# Patient Record
Sex: Female | Born: 1985 | Race: White | Hispanic: No | Marital: Single | Smoking: Never smoker
Health system: Southern US, Community
[De-identification: ages and names within clinical notes are randomized; demographics above are authoritative.]

## PROBLEM LIST (undated history)

## (undated) DIAGNOSIS — F419 Anxiety disorder, unspecified: Secondary | ICD-10-CM

## (undated) DIAGNOSIS — I10 Essential (primary) hypertension: Secondary | ICD-10-CM

## (undated) DIAGNOSIS — D649 Anemia, unspecified: Secondary | ICD-10-CM

## (undated) DIAGNOSIS — F909 Attention-deficit hyperactivity disorder, unspecified type: Secondary | ICD-10-CM

## (undated) DIAGNOSIS — F32A Depression, unspecified: Secondary | ICD-10-CM

## (undated) DIAGNOSIS — Z86018 Personal history of other benign neoplasm: Secondary | ICD-10-CM

## (undated) HISTORY — DX: Anemia, unspecified: D64.9

## (undated) HISTORY — DX: Essential (primary) hypertension: I10

## (undated) HISTORY — DX: Depression, unspecified: F32.A

---
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
## 2009-07-15 ENCOUNTER — Ambulatory Visit: Payer: Self-pay | Admitting: Family

## 2010-07-26 ENCOUNTER — Ambulatory Visit: Payer: Self-pay | Admitting: Internal Medicine

## 2010-08-24 DIAGNOSIS — Z86018 Personal history of other benign neoplasm: Secondary | ICD-10-CM

## 2010-08-24 HISTORY — DX: Personal history of other benign neoplasm: Z86.018

## 2011-05-22 ENCOUNTER — Ambulatory Visit: Payer: Self-pay | Admitting: Internal Medicine

## 2011-07-02 ENCOUNTER — Ambulatory Visit: Payer: Self-pay | Admitting: Internal Medicine

## 2011-08-05 ENCOUNTER — Encounter: Payer: Self-pay | Admitting: Internal Medicine

## 2011-08-05 ENCOUNTER — Ambulatory Visit (INDEPENDENT_AMBULATORY_CARE_PROVIDER_SITE_OTHER): Payer: Self-pay | Admitting: Internal Medicine

## 2011-08-05 VITALS — BP 120/94 | HR 95 | Temp 98.5°F | Resp 16 | Ht 65.0 in | Wt 204.5 lb

## 2011-08-05 DIAGNOSIS — Z124 Encounter for screening for malignant neoplasm of cervix: Secondary | ICD-10-CM | POA: Insufficient documentation

## 2011-08-05 DIAGNOSIS — I10 Essential (primary) hypertension: Secondary | ICD-10-CM

## 2011-08-05 DIAGNOSIS — E669 Obesity, unspecified: Secondary | ICD-10-CM

## 2011-08-05 MED ORDER — HYDROCHLOROTHIAZIDE 25 MG PO TABS: 25.0000 mg | ORAL_TABLET | Freq: Every day | ORAL | Status: DC

## 2011-08-05 NOTE — Progress Notes (Signed)
  Subjective:    Patient ID: Cynthia Lewis, female    DOB: [birth year], 25 y.o.   MRN: [id number]  HPI    Review of Systems     Objective:   Physical Exam        Assessment & Plan:  BP 120/94  Pulse 95  Temp(Src) 98.5 F (36.9 C) (Oral)  Resp 16  Ht 5\' 5"  (1.651 m)  Wt 204 lb 8 oz (92.761 kg)  BMI 34.03 kg/m2  SpO2 99%  LMP 07/06/2011  Subjective:     Cynthia Lewis is a 25 y.o. female here for a routine exam.  Current complaints: hypertension.  Personal health questionnaire reviewed: no.   Gynecologic History Patient's last menstrual period was 07/06/2011. Contraception: oral progesterone-only contraceptive Last Pap: 2011. Results were: normal Last mammogram: never done.   Obstetric History OB History    Grav Para Term Preterm Abortions TAB SAB Ect Mult Living                   The following portions of the patient's history were reviewed and updated as appropriate: allergies, current medications, past family history, past medical history, past social history, past surgical history and problem list.  Review of Systems A comprehensive review of systems was negative.    Objective:    BP 120/94  Pulse 95  Temp(Src) 98.5 F (36.9 C) (Oral)  Resp 16  Ht 5\' 5"  (1.651 m)  Wt 204 lb 8 oz (92.761 kg)  BMI 34.03 kg/m2  SpO2 99%  LMP 07/06/2011 General appearance: alert, cooperative, appears stated age and mildly obese Eyes: conjunctivae/corneas clear. PERRL, EOM's intact. Fundi benign. Ears: normal TM's and external ear canals both ears Nose: Nares normal. Septum midline. Mucosa normal. No drainage or sinus tenderness. Neck: no adenopathy, no carotid bruit, no JVD, supple, symmetrical, trachea midline and thyroid not enlarged, symmetric, no tenderness/mass/nodules Lungs: clear to auscultation bilaterally Heart: regular rate and rhythm, S1, S2 normal, no murmur, click, rub or gallop Abdomen: soft, non-tender; bowel sounds normal; no masses,  no  organomegaly Extremities: extremities normal, atraumatic, no cyanosis or edema Skin: Skin color, texture, turgor normal. No rashes or lesions    Assessment:  Obesity. Hypertension    Plan:    Education reviewed: weight bearing exercise and low carbohydrate diet.   HCTZ 25 mg daily

## 2011-08-05 NOTE — Assessment & Plan Note (Signed)
With persistent diastolic elevation, confirmed by previous readings.  Will stat hctz.  She is interested in knowing if she can eventually come off of medications,which I said may be possible with loss of 105 of her body weight.

## 2011-08-05 NOTE — Assessment & Plan Note (Signed)
Discussed need to lose 10% of body weight using diet and exercise.  Spent 15 minutes  Discussing different diets and fundamental concepts along with need for exercise. .  She is motivated due to new diagnosis of hypertension.

## 2011-08-07 LAB — HEPATIC FUNCTION PANEL
Alkaline Phosphatase: 62 IU/L (ref 25–150)
Bilirubin, Direct: 0.08 mg/dL (ref 0.00–0.40)
Total Bilirubin: 0.2 mg/dL (ref 0.0–1.2)
Total Protein: 6.7 g/dL (ref 6.0–8.5)

## 2011-08-07 LAB — BASIC METABOLIC PANEL
Calcium: 9.1 mg/dL (ref 8.7–10.2)
Creatinine, Ser: 0.94 mg/dL (ref 0.57–1.00)
GFR calc Af Amer: 98 mL/min/{1.73_m2} (ref >59)
GFR calc non Af Amer: 85 mL/min/{1.73_m2} (ref >59)
Glucose: 106 mg/dL — ABNORMAL HIGH (ref 65–99)
Sodium: 139 mmol/L (ref 134–144)

## 2011-10-07 ENCOUNTER — Telehealth: Payer: Self-pay | Admitting: Internal Medicine

## 2011-10-07 NOTE — Telephone Encounter (Signed)
Spoke with patient.  That BP reading was from December, before she started on blood pressure medicine.  She says she is not concerned about that now, but would like to talk to Dr. Darrick Huntsman about ways to control her BP so that she can come off the medicine.

## 2011-10-07 NOTE — Telephone Encounter (Signed)
[phone] Pt called to get appointment to check pt wanted after 4  Made appointment for 2/25 @ 4 pt mention her bp was running 124/90

## 2011-10-19 ENCOUNTER — Encounter: Payer: Self-pay | Admitting: Internal Medicine

## 2011-10-19 ENCOUNTER — Ambulatory Visit (INDEPENDENT_AMBULATORY_CARE_PROVIDER_SITE_OTHER): Payer: 59 | Admitting: Internal Medicine

## 2011-10-19 DIAGNOSIS — I10 Essential (primary) hypertension: Secondary | ICD-10-CM

## 2011-10-19 DIAGNOSIS — E669 Obesity, unspecified: Secondary | ICD-10-CM

## 2011-10-19 NOTE — Patient Instructions (Addendum)
B[employer]'s Carb Smart Fudgsicle;    100 cal   3 net carbs   EAS Carb Control shakes:  100 cal 2.5 carbs,  17 g protein    [employer] makes low carb pita bread and flatbread available at [facility] and [employer]               Toufayan low carb tortillas at [employer] makes a low carb tortilla too

## 2011-10-19 NOTE — Assessment & Plan Note (Signed)
Improved with weight loss and exercise. Discussed low carbohydrate diet alternatives to help dietary restrictions.

## 2011-10-19 NOTE — Assessment & Plan Note (Signed)
Well controlled on current medications.  No changes today. 

## 2011-10-19 NOTE — Progress Notes (Signed)
  Subjective:    Patient ID: Cynthia Lewis, female    DOB: 03-Jan-1986, 26 y.o.   MRN: [id number]  HPI  26 yr old white female with history of obesity and hypertension returns for followup.Marland Kitchen  Has been working with a Systems analyst for the past month and has achieve wt loss of over 6 lbs. Blood pressure has improved on HCTZ.  Past Medical History  Diagnosis Date  . Hypertension    Current Outpatient Prescriptions on File Prior to Visit  Medication Sig Dispense Refill  . etonogestrel-ethinyl estradiol (NUVARING) 0.12-0.015 MG/24HR vaginal ring Place 1 each vaginally every 28 (twenty-eight) days. Insert vaginally and leave in place for 3 consecutive weeks, then remove for 1 week.       . hydrochlorothiazide (HYDRODIURIL) 25 MG tablet Take 1 tablet (25 mg total) by mouth daily.  90 tablet  3    Review of Systems  Constitutional: Negative for fever, chills and unexpected weight change.  HENT: Negative for hearing loss, ear pain, nosebleeds, congestion, sore throat, facial swelling, rhinorrhea, sneezing, mouth sores, trouble swallowing, neck pain, neck stiffness, voice change, postnasal drip, sinus pressure, tinnitus and ear discharge.   Eyes: Negative for pain, discharge, redness and visual disturbance.  Respiratory: Negative for cough, chest tightness, shortness of breath, wheezing and stridor.   Cardiovascular: Negative for chest pain, palpitations and leg swelling.  Musculoskeletal: Negative for myalgias and arthralgias.  Skin: Negative for color change and rash.  Neurological: Negative for dizziness, weakness, light-headedness and headaches.  Hematological: Negative for adenopathy.       Objective:   Physical Exam  Constitutional: She is oriented to person, place, and time. She appears well-developed and well-nourished.  HENT:  Mouth/Throat: Oropharynx is clear and moist.  Eyes: EOM are normal. Pupils are equal, round, and reactive to light. No scleral icterus.  Neck: Normal range  of motion. Neck supple. No JVD present. No thyromegaly present.  Cardiovascular: Normal rate, regular rhythm, normal heart sounds and intact distal pulses.   Pulmonary/Chest: Effort normal and breath sounds normal.  Abdominal: Soft. Bowel sounds are normal. She exhibits no mass. There is no tenderness.  Musculoskeletal: Normal range of motion. She exhibits no edema.  Lymphadenopathy:    She has no cervical adenopathy.  Neurological: She is alert and oriented to person, place, and time.  Skin: Skin is warm and dry.  Psychiatric: She has a normal mood and affect.       Assessment & Plan:.ase   Hypertension Well controlled on current medications.  No changes today.  Obesity (BMI 30-39.9) Improved with weight loss and exercise. Discussed low carbohydrate diet alternatives to help dietary restrictions.   40 minutes spent discussing and counselling on diet  And exercise.   Updated Medication List Outpatient Encounter Prescriptions as of 10/19/2011  Medication Sig Dispense Refill  . etonogestrel-ethinyl estradiol (NUVARING) 0.12-0.015 MG/24HR vaginal ring Place 1 each vaginally every 28 (twenty-eight) days. Insert vaginally and leave in place for 3 consecutive weeks, then remove for 1 week.       . hydrochlorothiazide (HYDRODIURIL) 25 MG tablet Take 1 tablet (25 mg total) by mouth daily.  90 tablet  3

## 2011-10-27 ENCOUNTER — Encounter: Payer: Self-pay | Admitting: Internal Medicine

## 2011-12-01 ENCOUNTER — Encounter: Payer: Self-pay | Admitting: Internal Medicine

## 2011-12-01 ENCOUNTER — Ambulatory Visit (INDEPENDENT_AMBULATORY_CARE_PROVIDER_SITE_OTHER): Payer: 59 | Admitting: Internal Medicine

## 2011-12-01 VITALS — BP 110/72 | HR 66 | Temp 98.4°F | Resp 14 | Wt 183.2 lb

## 2011-12-01 DIAGNOSIS — E669 Obesity, unspecified: Secondary | ICD-10-CM

## 2011-12-01 DIAGNOSIS — I10 Essential (primary) hypertension: Secondary | ICD-10-CM

## 2011-12-01 NOTE — Progress Notes (Signed)
Patient ID: VALENA IVANOV, female   DOB: Jun 24, 1986, 26 y.o.   MRN: [id number] Patient Active Problem List  Diagnoses  . Hypertension  . Obesity (BMI 30-39.9)  . Screening for cervical cancer    Subjective:  CC:   Chief Complaint  Patient presents with  . Follow-up    HPI:   Jeily Guthridge Johnsonis a 26 y.o. female who presents Follow up on hypertension and obesity .  She has lost 16 more lbs in  Two months.  She is exercising 7 days per week doing a combination of aero and a  And following a carbohydrate restricted diet.  She feels great,  Has no new issues and has not taken her blood pressure medications in one week in order to assess need for continued therapy today    Past Medical History  Diagnosis Date  . Hypertension     No past surgical history on file.       The following portions of the patient's history were reviewed and updated as appropriate: Allergies, current medications, and problem list.    Review of Systems:   12 Pt  review of systems was negative except those addressed in the HPI,     History   Social History  . Marital Status: Single    Spouse Name: N/A    Number of Children: N/A  . Years of Education: N/A   Occupational History  . Not on file.   Social History Main Topics  . Smoking status: Never Smoker   . Smokeless tobacco: Never Used  . Alcohol Use: Yes  . Drug Use: No  . Sexually Active: Not on file   Other Topics Concern  . Not on file   Social History Narrative  . No narrative on file    Objective:  BP 110/72  Pulse 66  Temp(Src) 98.4 F (36.9 C) (Oral)  Resp 14  Wt 183 lb 4 oz (83.122 kg)  SpO2 100%  LMP 10/31/2011  General appearance: alert, cooperative and appears stated age Ears: normal TM's and external ear canals both ears Throat: lips, mucosa, and tongue normal; teeth and gums normal Neck: no adenopathy, no carotid bruit, supple, symmetrical, trachea midline and thyroid not enlarged, symmetric, no  tenderness/mass/nodules Back: symmetric, no curvature. ROM normal. No CVA tenderness. Lungs: clear to auscultation bilaterally Heart: regular rate and rhythm, S1, S2 normal, no murmur, click, rub or gallop Abdomen: soft, non-tender; bowel sounds normal; no masses,  no organomegaly Pulses: 2+ and symmetric Skin: Skin color, texture, turgor normal. No rashes or lesions Lymph nodes: Cervical, supraclavicular, and axillary nodes normal.  Assessment and Plan:  Hypertension Now resolved with loss of 10% of body weight.  We are discontinuing her medications.   Obesity (BMI 30-39.9) She has lost 10% of her body weight and has reduced her BMI significantly. She is no longer hypertensive and we are stopping her blood pressure medications her Goal is a wright of 148 pounds she plans to be there by the end of the year.    Updated Medication List Outpatient Encounter Prescriptions as of 12/01/2011  Medication Sig Dispense Refill  . Cholecalciferol (VITAMIN D3) 1000 UNITS CAPS Take by mouth.      . etonogestrel-ethinyl estradiol (NUVARING) 0.12-0.015 MG/24HR vaginal ring Place 1 each vaginally every 28 (twenty-eight) days. Insert vaginally and leave in place for 3 consecutive weeks, then remove for 1 week.       . fish oil-omega-3 fatty acids 1000 MG capsule Take 2 g by  mouth daily.      Marland Kitchen DISCONTD: hydrochlorothiazide (HYDRODIURIL) 25 MG tablet Take 1 tablet (25 mg total) by mouth daily.  90 tablet  3     No orders of the defined types were placed in this encounter.    No Follow-up on file.

## 2011-12-01 NOTE — Assessment & Plan Note (Addendum)
Now resolved with loss of 10% of body weight.  We are discontinuing her medications.

## 2011-12-02 ENCOUNTER — Encounter: Payer: Self-pay | Admitting: Internal Medicine

## 2011-12-02 NOTE — Assessment & Plan Note (Signed)
She has lost 10% of her body weight and has reduced her BMI significantly. She is no longer hypertensive and we are stopping her blood pressure medications her Goal is a wright of 148 pounds she plans to be there by the end of the year.

## 2011-12-14 ENCOUNTER — Ambulatory Visit: Payer: 59 | Admitting: Internal Medicine

## 2012-04-27 ENCOUNTER — Encounter: Payer: 59 | Admitting: Internal Medicine

## 2012-08-08 ENCOUNTER — Encounter: Payer: 59 | Admitting: Internal Medicine

## 2012-11-01 LAB — HM PAP SMEAR

## 2012-11-03 ENCOUNTER — Encounter: Payer: Self-pay | Admitting: Internal Medicine

## 2012-11-03 ENCOUNTER — Ambulatory Visit (INDEPENDENT_AMBULATORY_CARE_PROVIDER_SITE_OTHER): Payer: 59 | Admitting: Internal Medicine

## 2012-11-03 VITALS — BP 114/80 | HR 73 | Temp 98.6°F | Resp 16 | Ht 66.25 in | Wt 158.2 lb

## 2012-11-03 DIAGNOSIS — F411 Generalized anxiety disorder: Secondary | ICD-10-CM

## 2012-11-03 DIAGNOSIS — Z124 Encounter for screening for malignant neoplasm of cervix: Secondary | ICD-10-CM

## 2012-11-03 DIAGNOSIS — Z23 Encounter for immunization: Secondary | ICD-10-CM

## 2012-11-03 DIAGNOSIS — Z0000 Encounter for general adult medical examination without abnormal findings: Secondary | ICD-10-CM

## 2012-11-03 DIAGNOSIS — E669 Obesity, unspecified: Secondary | ICD-10-CM

## 2012-11-03 NOTE — Patient Instructions (Addendum)
Let me know if you decide you need medication to handle your anxiety   I will call you once I review your labs

## 2012-11-05 DIAGNOSIS — F411 Generalized anxiety disorder: Secondary | ICD-10-CM | POA: Insufficient documentation

## 2012-11-05 DIAGNOSIS — Z0000 Encounter for general adult medical examination without abnormal findings: Secondary | ICD-10-CM | POA: Insufficient documentation

## 2012-11-05 NOTE — Assessment & Plan Note (Signed)
Annual comprehensive exam was done excluding breast, pelvic and PAP smear. All screenings have been addressed .  

## 2012-11-05 NOTE — Assessment & Plan Note (Signed)
Normal PAP March 2014.

## 2012-11-05 NOTE — Assessment & Plan Note (Signed)
Secondary to work stressors.  She does not have insomnia and does not want medication at this time.

## 2012-11-05 NOTE — Progress Notes (Signed)
Patient ID: Cynthia Lewis, female   DOB: 1986-05-29, 27 y.o.   MRN: [id number]  Subjective:     Cynthia Lewis is a 27 y.o. female and is here for a comprehensive physical exam. The patient reports anxiety.  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 She has continued to lose weigth intentionally with diet and exercise and is entering a bikini contest in August.   History   Social History  . Marital Status: Single    Spouse Name: N/A    Number of Children: N/A  . Years of Education: N/A   Occupational History  . Not on file.   Social History Main Topics  . Smoking status: Never Smoker   . Smokeless tobacco: Never Used  . Alcohol Use: Yes  . Drug Use: No  . Sexually Active: Not on file   Other Topics Concern  . Not on file   Social History Narrative  . No narrative on file   Health Maintenance  Topic Date Due  . Influenza Vaccine  04/24/2013  . Pap Smear  11/02/2015  . Tetanus/tdap  11/04/2022    The following portions of the patient's history were reviewed and updated as appropriate: allergies, current medications, past family history, past medical history, past social history, past surgical history and problem list.  Review of Systems A comprehensive review of systems was negative.   Objective:    BP 114/80  Pulse 73  Temp(Src) 98.6 F (37 C) (Oral)  Resp 16  Ht 5' 6.25" (1.683 m)  Wt 158 lb 4 oz (71.782 kg)  BMI 25.34 kg/m2  SpO2 97%  LMP 10/07/2012  General appearance: alert, cooperative and appears stated age Ears: normal TM's and external ear canals both ears Throat: lips, mucosa, and tongue normal; teeth and gums normal Neck: no adenopathy, no carotid bruit, supple, symmetrical, trachea midline and thyroid not enlarged, symmetric, no tenderness/mass/nodules Back: symmetric, no curvature. ROM normal. No CVA tenderness. Lungs: clear to auscultation bilaterally Heart: regular rate and rhythm,  S1, S2 normal, no murmur, click, rub or gallop Abdomen: soft, non-tender; bowel sounds normal; no masses,  no organomegaly Pulses: 2+ and symmetric Skin: Skin color, texture, turgor normal. No rashes or lesions Lymph nodes: Cervical, supraclavicular, and axillary nodes normal.  Assessment:    Obesity (BMI 30-39.9) She has lost 46 lbs since her  First visit through diet and exercise.    Screening for cervical cancer Normal PAP March 2014.  Anxiety state, unspecified Secondary to work stressors.  She does not have insomnia and does not want medication at this time.   Routine general medical examination at a health care facility Annual comprehensive exam was done excluding breast, pelvic and PAP smear. All screenings have been addressed .    Updated Medication List Outpatient Encounter Prescriptions as of 11/03/2012  Medication Sig Dispense Refill  . Cholecalciferol (VITAMIN D3) 1000 UNITS CAPS Take by mouth.      . etonogestrel-ethinyl estradiol (NUVARING) 0.12-0.015 MG/24HR vaginal ring Place 1 each vaginally every 28 (twenty-eight) days. Insert vaginally and leave in place for 3 consecutive weeks, then remove for 1 week.       Melene Muller ON 11/11/2012] levonorgestrel (MIRENA) 20 MCG/24HR IUD 1 each by Intrauterine route once.      . [DISCONTINUED] fish oil-omega-3 fatty acids 1000 MG capsule Take 2 g by mouth daily.       No facility-administered encounter medications on file as of 11/03/2012.

## 2012-11-05 NOTE — Assessment & Plan Note (Signed)
She has lost 46 lbs since her  First visit through diet and exercise.

## 2012-11-10 ENCOUNTER — Telehealth: Payer: Self-pay | Admitting: Internal Medicine

## 2012-11-10 DIAGNOSIS — D509 Iron deficiency anemia, unspecified: Secondary | ICD-10-CM

## 2012-12-01 ENCOUNTER — Encounter: Payer: Self-pay | Admitting: Internal Medicine

## 2013-03-13 ENCOUNTER — Ambulatory Visit (INDEPENDENT_AMBULATORY_CARE_PROVIDER_SITE_OTHER): Payer: 59 | Admitting: Internal Medicine

## 2013-03-13 ENCOUNTER — Telehealth: Payer: Self-pay | Admitting: Internal Medicine

## 2013-03-13 ENCOUNTER — Encounter: Payer: Self-pay | Admitting: Internal Medicine

## 2013-03-13 VITALS — BP 104/76 | HR 67 | Temp 98.7°F | Resp 14 | Wt 148.5 lb

## 2013-03-13 DIAGNOSIS — N6452 Nipple discharge: Secondary | ICD-10-CM

## 2013-03-13 DIAGNOSIS — N6459 Other signs and symptoms in breast: Secondary | ICD-10-CM

## 2013-03-13 LAB — POCT URINE PREGNANCY: Preg Test, Ur: NEGATIVE

## 2013-03-13 NOTE — Progress Notes (Signed)
Patient ID: Cynthia Lewis, female   DOB: [birth year], 27 y.o.   MRN: [id number]  Patient Active Problem List   Diagnosis Date Noted  . Nipple discharge in female 03/13/2013  . Anemia, iron deficiency 11/10/2012  . Anxiety state, unspecified 11/05/2012  . Routine general medical examination at a health care facility 11/05/2012  . Obesity (BMI 30-39.9) 08/05/2011  . Screening for cervical cancer 08/05/2011    Subjective:  CC:   Chief Complaint  Patient presents with  . Acute Visit    Discharge from left breast clear with a milky yellow tinge to discharge X 3 days.    HPI:   Cynthia Tuohy Johnsonis a 27 y.o. female who presents Nonbloody Breast discharge since Saturday.  She was doing a self breast exam and noticed greenish tinged fluid when she squeezed her breast.  No priro history of breast abnormalities,  But paternal GM had breast Ca.   She has been using Mirena since march for contraceptions,  And only other medication is   "a fat burner" recommended to her by her personal trainer for weight loss.    Periods have been light after the first month of the mirena,  previously on th re Nuva Ring . UPT is pending  Has lost 56 lbs intentionally since Dec 2012  Through diet and exercise.    Past Medical History  Diagnosis Date  . Hypertension     No past surgical history on file.     The following portions of the patient's history were reviewed and updated as appropriate: Allergies, current medications, and problem list.    Review of Systems:   12 Pt  review of systems was negative except those addressed in the HPI,     History   Social History  . Marital Status: Single    Spouse Name: N/A    Number of Children: N/A  . Years of Education: N/A   Occupational History  . Not on file.   Social History Main Topics  . Smoking status: Never Smoker   . Smokeless tobacco: Never Used  . Alcohol Use: Yes  . Drug Use: No  . Sexually Active: Not on file   Other Topics  Concern  . Not on file   Social History Narrative  . No narrative on file    Objective:  BP 104/76  Pulse 67  Temp(Src) 98.7 F (37.1 C) (Oral)  Resp 14  Wt 148 lb 8 oz (67.359 kg)  BMI 23.78 kg/m2  SpO2 99%  General appearance: alert, cooperative and appears stated age Ears: normal TM's and external ear canals both ears Throat: lips, mucosa, and tongue normal; teeth and gums normal Neck: no adenopathy, no carotid bruit, supple, symmetrical, trachea midline and thyroid not enlarged, symmetric, no tenderness/mass/nodules Breast: breasts nodular, no discrete masses.  Assessment and Plan:  Nipple discharge in female Left side, occurred during self breast exam .  Has bvery denese breasts .FH og breast  In paternal GM.  diagnostic mammogram ordered   Updated Medication List Outpatient Encounter Prescriptions as of 03/13/2013  Medication Sig Dispense Refill  . Cholecalciferol (VITAMIN D3) 1000 UNITS CAPS Take by mouth.      . levonorgestrel (MIRENA) 20 MCG/24HR IUD 1 each by Intrauterine route once.      . [DISCONTINUED] etonogestrel-ethinyl estradiol (NUVARING) 0.12-0.015 MG/24HR vaginal ring Place 1 each vaginally every 28 (twenty-eight) days. Insert vaginally and leave in place for 3 consecutive weeks, then remove for 1 week.  No facility-administered encounter medications on file as of 03/13/2013.     Orders Placed This Encounter  Procedures  . US Breast Left  . POCT urine pregnancy    No Follow-up on file.

## 2013-03-13 NOTE — Telephone Encounter (Signed)
Left message, advising pt to call back to schedule an office visit.

## 2013-03-13 NOTE — Assessment & Plan Note (Signed)
Left side, occurred during self breast exam .  Has bvery denese breasts .FH og breast  In paternal GM.  diagnostic mammogram ordered

## 2013-03-13 NOTE — Telephone Encounter (Signed)
Patient needs appt re breast discharge.n  Her mychart is not  working

## 2013-03-21 ENCOUNTER — Other Ambulatory Visit: Payer: Self-pay | Admitting: Interventional Cardiology

## 2013-03-21 DIAGNOSIS — N6452 Nipple discharge: Secondary | ICD-10-CM

## 2013-03-22 ENCOUNTER — Telehealth: Payer: Self-pay | Admitting: Internal Medicine

## 2013-03-22 NOTE — Telephone Encounter (Signed)
Was prescribed Azithromycin for strep throat X 3 days ago at urgent care patient still reporting fever 101, taking Ibuprofen for fever and chills patient wanting to know if she should be seen here or give the ABX more time?

## 2013-03-22 NOTE — Telephone Encounter (Signed)
Typically an antibiotic should cause fevers to subside on Day 2 or 3 of treatment.  Failure to resolve means the bacteria is resistant to the antibiotic, or the fever is coming from a virus.

## 2013-03-22 NOTE — Telephone Encounter (Signed)
Was seen at urgent care and is on day #3 of antibiotic for diagnosed strep throat.  States she is still fighting a fever and is wondering if this may be something other than strep.  Asking if she needs to be patient and wait it out or if she should come in to be seen.

## 2013-03-23 NOTE — Telephone Encounter (Signed)
Patient reports feeling better today and no fever during the night, offered patient an appointment with Orville Govern NP, patient declined. Advised patient that if fevers returned she needed to be seen to call office.

## 2013-03-24 ENCOUNTER — Encounter: Payer: Self-pay | Admitting: Internal Medicine

## 2013-03-24 ENCOUNTER — Ambulatory Visit
Admission: RE | Admit: 2013-03-24 | Discharge: 2013-03-24 | Disposition: A | Discharge status: Home / Self Care | Payer: 59 | Source: Ambulatory Visit | Attending: Internal Medicine | Admitting: Internal Medicine

## 2013-03-24 ENCOUNTER — Ambulatory Visit (INDEPENDENT_AMBULATORY_CARE_PROVIDER_SITE_OTHER): Payer: 59 | Admitting: Internal Medicine

## 2013-03-24 VITALS — BP 98/60 | HR 83 | Temp 98.6°F | Resp 14 | Wt 147.0 lb

## 2013-03-24 DIAGNOSIS — R509 Fever, unspecified: Secondary | ICD-10-CM

## 2013-03-24 DIAGNOSIS — N6452 Nipple discharge: Secondary | ICD-10-CM

## 2013-03-24 DIAGNOSIS — R599 Enlarged lymph nodes, unspecified: Secondary | ICD-10-CM

## 2013-03-24 DIAGNOSIS — N6459 Other signs and symptoms in breast: Secondary | ICD-10-CM

## 2013-03-24 DIAGNOSIS — J029 Acute pharyngitis, unspecified: Secondary | ICD-10-CM

## 2013-03-24 DIAGNOSIS — R590 Localized enlarged lymph nodes: Secondary | ICD-10-CM

## 2013-03-24 MED ORDER — AMOXICILLIN-POT CLAVULANATE 875-125 MG PO TABS: 1.0000 | ORAL_TABLET | Freq: Two times a day (BID) | ORAL | Status: DC

## 2013-03-24 MED ORDER — ONDANSETRON HCL 4 MG PO TABS: 4.0000 mg | ORAL_TABLET | Freq: Three times a day (TID) | ORAL | Status: DC | PRN

## 2013-03-24 NOTE — Progress Notes (Signed)
Patient ID: Cynthia Lewis, female   DOB: 21-Feb-1986, 27 y.o.   MRN: [id number]   Patient Active Problem List   Diagnosis Date Noted  . Acute pharyngitis 03/25/2013  . Nipple discharge in female 03/13/2013  . Anemia, iron deficiency 11/10/2012  . Anxiety state, unspecified 11/05/2012  . Routine general medical examination at a health care facility 11/05/2012  . Obesity (BMI 30-39.9) 08/05/2011  . Screening for cervical cancer 08/05/2011    Subjective:  CC:   No chief complaint on file.   HPI:   Cynthia Kham Johnsonis a 27 y.o. female who presents with persistent sore throat and fevers.  Sore throat back ache and fever started on Saturday, and when symptoms persistent she was treated at  Urgent Care on Monday with a 5 day coursr of azithromycin despite a negative rapid strep test given her exposure to boyfriend who tested positive for Strep C.  She reports fevers and back ache resolved on Wednesday but continues to have sore throat, malaise and swollen cervical lymph nodes.       Past Medical History  Diagnosis Date  . Hypertension     History reviewed. No pertinent past surgical history.     The following portions of the patient's history were reviewed and updated as appropriate: Allergies, current medications, and problem list.    Review of Systems:   12 Pt  review of systems was negative except those addressed in the HPI,     History   Social History  . Marital Status: Single    Spouse Name: N/A    Number of Children: N/A  . Years of Education: N/A   Occupational History  . Not on file.   Social History Main Topics  . Smoking status: Never Smoker   . Smokeless tobacco: Never Used  . Alcohol Use: Yes  . Drug Use: No  . Sexually Active: Not on file   Other Topics Concern  . Not on file   Social History Narrative  . No narrative on file    Objective:  BP 98/60  Pulse 83  Temp(Src) 98.6 F (37 C) (Oral)  Resp 14  Wt 147 lb (66.679 kg)  BMI  23.54 kg/m2  SpO2 99%  General appearance: alert, cooperative and appears stated age Ears: normal TM's and external ear canals both ears Throat: lips, mucosa, and tongue normal; teeth and gums normal Neck: no adenopathy, no carotid bruit, supple, symmetrical, trachea midline and thyroid not enlarged, symmetric, no tenderness/mass/nodules Back: symmetric, no curvature. ROM normal. No CVA tenderness. Lungs: clear to auscultation bilaterally Heart: regular rate and rhythm, S1, S2 normal, no murmur, click, rub or gallop Abdomen: soft, non-tender; bowel sounds normal; no masses,  no organomegaly Pulses: 2+ and symmetric Skin: Skin color, texture, turgor normal. No rashes or lesions Lymph nodes: Cervical, supraclavicular, and axillary nodes normal.  Assessment and Plan:  Acute pharyngitis Her fevers abated 48 hours after starting antibiotics but she is still symptomatic.  Azithromycin was chosen given exposure to Strep C bc of prior intolerance, not allergy to PCN.  ThroatStrep culture and monospot sent ,  augmentin and zofran sent to pharmacy  Nipple discharge in female Diagnostic mammogram and ultrasound was done at [facility] and no evidence of mass or cyst was found.    Updated Medication List Outpatient Encounter Prescriptions as of 03/24/2013  Medication Sig Dispense Refill  . azithromycin (ZITHROMAX Z-PAK) 250 MG tablet Take 250 mg by mouth daily.      Marland Kitchen amoxicillin-clavulanate (AUGMENTIN)  875-125 MG per tablet Take 1 tablet by mouth 2 (two) times daily.  20 tablet  0  . Cholecalciferol (VITAMIN D3) 1000 UNITS CAPS Take by mouth.      . levonorgestrel (MIRENA) 20 MCG/24HR IUD 1 each by Intrauterine route once.      . ondansetron (ZOFRAN) 4 MG tablet Take 1 tablet (4 mg total) by mouth every 8 (eight) hours as needed for nausea.  20 tablet  0   No facility-administered encounter medications on file as of 03/24/2013.     Orders Placed This Encounter  Procedures  . Throat culture [facility])   . Monospot  . Epstein-Barr virus VCA antibody panel  . CBC w/Diff    No Follow-up on file.

## 2013-03-24 NOTE — Patient Instructions (Addendum)
I am treating you empirically for Strep C with augmentin, which is a penicillin, while your throat culture takes 48 hours to finalize  I am also checking you for mononucleosis   i sent an rx for zofran,  For nausea,  Take it 30 minutes prior to you augmentin doses  And take your augmenin after a meal

## 2013-03-25 ENCOUNTER — Encounter: Payer: Self-pay | Admitting: Internal Medicine

## 2013-03-25 DIAGNOSIS — J029 Acute pharyngitis, unspecified: Secondary | ICD-10-CM | POA: Insufficient documentation

## 2013-03-25 NOTE — Assessment & Plan Note (Signed)
Diagnostic mammogram and ultrasound was done at [facility] and no evidence of mass or cyst was found.

## 2013-03-25 NOTE — Assessment & Plan Note (Signed)
Her fevers abated 48 hours after starting antibiotics but she is still symptomatic.  Azithromycin was chosen given exposure to Strep C bc of prior intolerance, not allergy to PCN.  ThroatStrep culture and monospot sent ,  augmentin and zofran sent to pharmacy

## 2013-03-26 LAB — CBC WITH DIFFERENTIAL/PLATELET
Basos: 1 % (ref 0–3)
Eos: 2 % (ref 0–5)
Hemoglobin: 10.7 g/dL — ABNORMAL LOW (ref 11.1–15.9)
Immature Grans (Abs): 0 10*3/uL (ref 0.0–0.1)
MCV: 71 fL — ABNORMAL LOW (ref 79–97)
Monocytes Absolute: 0.6 10*3/uL (ref 0.1–0.9)
Neutrophils Absolute: 3.3 10*3/uL (ref 1.4–7.0)
Neutrophils Relative %: 45 % (ref 40–74)
RBC: 4.97 x10E6/uL (ref 3.77–5.28)
WBC: 7.4 10*3/uL (ref 3.4–10.8)

## 2013-03-26 LAB — CULTURE, GROUP A STREP

## 2013-03-26 LAB — EPSTEIN-BARR VIRUS VCA ANTIBODY PANEL: EBV AB VCA, IGG: 30.2 U/mL — ABNORMAL HIGH (ref 0.0–17.9)

## 2013-03-27 ENCOUNTER — Encounter: Payer: Self-pay | Admitting: *Deleted

## 2013-08-10 ENCOUNTER — Ambulatory Visit (INDEPENDENT_AMBULATORY_CARE_PROVIDER_SITE_OTHER): Payer: 59 | Admitting: Internal Medicine

## 2013-08-10 VITALS — BP 126/80 | HR 75 | Temp 98.2°F | Wt 172.0 lb

## 2013-08-10 DIAGNOSIS — E663 Overweight: Secondary | ICD-10-CM

## 2013-08-10 DIAGNOSIS — Z6825 Body mass index (BMI) 25.0-25.9, adult: Secondary | ICD-10-CM

## 2013-08-10 DIAGNOSIS — N390 Urinary tract infection, site not specified: Secondary | ICD-10-CM

## 2013-08-10 LAB — POCT URINALYSIS DIPSTICK
Nitrite, UA: POSITIVE
Protein, UA: NEGATIVE
Urobilinogen, UA: 0.2
pH, UA: 6

## 2013-08-10 MED ORDER — SULFAMETHOXAZOLE-TRIMETHOPRIM 800-160 MG PO TABS: 1.0000 | ORAL_TABLET | Freq: Two times a day (BID) | ORAL | Status: DC

## 2013-08-10 NOTE — Progress Notes (Signed)
Pre visit review using our clinic review tool, if applicable. No additional management support is needed unless otherwise documented below in the visit note. 

## 2013-08-10 NOTE — Progress Notes (Signed)
Patient ID: Cynthia Lewis, female   DOB: 08-02-1986, 27 y.o.   MRN: [id number]  Patient Active Problem List   Diagnosis Date Noted  . UTI (urinary tract infection) 08/12/2013  . Acute pharyngitis 03/25/2013  . Nipple discharge in female 03/13/2013  . Anemia, iron deficiency 11/10/2012  . Anxiety state, unspecified 11/05/2012  . Routine general medical examination at a health care facility 11/05/2012  . Overweight (BMI 25.0-29.9) 08/05/2011  . Screening for cervical cancer 08/05/2011    Subjective:  CC:   Chief Complaint  Patient presents with  . Urinary Tract Infection    HPI:   Cynthia Bureau Johnsonis a 27 y.o. female who presents 2 days of urinary frequency, which she treating with drinking cranberry juice which helped , but now having end stream dysuria. Subjective fevers also noted but have resolved.  No flank pain or nausea,  subsided.   Weight gain 25 lbs.  Has stopped dieting because she got sick twice and decided to stop the intensive wt loss.  Planning on going back on diet and resuming exercise after the holidays   Past Medical History  Diagnosis Date  . Hypertension     No past surgical history on file.     The following portions of the patient's history were reviewed and updated as appropriate: Allergies, current medications, and problem list.    Review of Systems:   12 Pt  review of systems was negative except those addressed in the HPI,     History   Social History  . Marital Status: Single    Spouse Name: N/A    Number of Children: N/A  . Years of Education: N/A   Occupational History  . Not on file.   Social History Main Topics  . Smoking status: Never Smoker   . Smokeless tobacco: Never Used  . Alcohol Use: Yes  . Drug Use: No  . Sexual Activity: Not on file   Other Topics Concern  . Not on file   Social History Narrative  . No narrative on file    Objective:  Filed Vitals:   08/10/13 0839  BP: 126/80  Pulse: 75  Temp:  98.2 F (36.8 C)     General appearance: alert, cooperative and appears stated age Ears: normal TM's and external ear canals both ears Throat: lips, mucosa, and tongue normal; teeth and gums normal Neck: no adenopathy, no carotid bruit, supple, symmetrical, trachea midline and thyroid not enlarged, symmetric, no tenderness/mass/nodules Back: symmetric, no curvature. ROM normal. No CVA tenderness. Lungs: clear to auscultation bilaterally Heart: regular rate and rhythm, S1, S2 normal, no murmur, click, rub or gallop Abdomen: soft, non-tender; bowel sounds normal; no masses,  no organomegaly Pulses: 2+ and symmetric Skin: Skin color, texture, turgor normal. No rashes or lesions Lymph nodes: Cervical, supraclavicular, and axillary nodes normal.  Assessment and Plan:  UTI (urinary tract infection) Empiric treatment with Septra based on history and  UA results.   Overweight (BMI 25.0-29.9) After losing  50 lbs and dropping BMI to < 24,  She has gained 25 lbs  Due to suspension of rigorous diet and exercise program . I have addressed  BMI and recommended wt loss of 10% of body weigh over the next 6 months using a low glycemic index diet and regular exercise a minimum of 5 days per week.     Updated Medication List Outpatient Encounter Prescriptions as of 08/10/2013  Medication Sig  . Cholecalciferol (VITAMIN D3) 1000 UNITS CAPS Take by  mouth.  . levonorgestrel (MIRENA) 20 MCG/24HR IUD 1 each by Intrauterine route once.  . sulfamethoxazole-trimethoprim (SEPTRA DS) 800-160 MG per tablet Take 1 tablet by mouth 2 (two) times daily.  . [DISCONTINUED] amoxicillin-clavulanate (AUGMENTIN) 875-125 MG per tablet Take 1 tablet by mouth 2 (two) times daily.  . [DISCONTINUED] azithromycin (ZITHROMAX Z-PAK) 250 MG tablet Take 250 mg by mouth daily.  . [DISCONTINUED] ondansetron (ZOFRAN) 4 MG tablet Take 1 tablet (4 mg total) by mouth every 8 (eight) hours as needed for nausea.     Orders Placed  This Encounter  Procedures  . Urine culture  . POCT urinalysis dipstick    No Follow-up on file.

## 2013-08-10 NOTE — Patient Instructions (Signed)
Empiric treatment of UTI with Septra. Pending urine culture results.

## 2013-08-12 ENCOUNTER — Encounter: Payer: Self-pay | Admitting: Internal Medicine

## 2013-08-12 DIAGNOSIS — N390 Urinary tract infection, site not specified: Secondary | ICD-10-CM | POA: Insufficient documentation

## 2013-08-12 LAB — URINE CULTURE

## 2013-08-12 NOTE — Assessment & Plan Note (Signed)
After losing  50 lbs and dropping BMI to < 24,  She has gained 25 lbs  Due to suspension of rigorous diet and exercise program . I have addressed  BMI and recommended wt loss of 10% of body weigh over the next 6 months using a low glycemic index diet and regular exercise a minimum of 5 days per week.

## 2013-08-12 NOTE — Assessment & Plan Note (Signed)
Empiric treatment with Septra based on history and  UA results.

## 2013-08-14 ENCOUNTER — Encounter: Payer: Self-pay | Admitting: Internal Medicine

## 2013-11-07 ENCOUNTER — Encounter: Payer: 59 | Admitting: Internal Medicine

## 2013-11-08 ENCOUNTER — Encounter: Payer: 59 | Admitting: Internal Medicine

## 2013-11-13 ENCOUNTER — Ambulatory Visit (INDEPENDENT_AMBULATORY_CARE_PROVIDER_SITE_OTHER): Payer: 59 | Admitting: Internal Medicine

## 2013-11-13 ENCOUNTER — Encounter: Payer: Self-pay | Admitting: Internal Medicine

## 2013-11-13 VITALS — BP 102/74 | HR 63 | Temp 98.4°F | Resp 16 | Ht 67.25 in | Wt 164.0 lb

## 2013-11-13 DIAGNOSIS — Z124 Encounter for screening for malignant neoplasm of cervix: Secondary | ICD-10-CM

## 2013-11-13 DIAGNOSIS — E663 Overweight: Secondary | ICD-10-CM

## 2013-11-13 DIAGNOSIS — D509 Iron deficiency anemia, unspecified: Secondary | ICD-10-CM

## 2013-11-13 DIAGNOSIS — Z0000 Encounter for general adult medical examination without abnormal findings: Secondary | ICD-10-CM

## 2013-11-13 MED ORDER — PHENTERMINE HCL 37.5 MG PO TABS: 19.0000 mg | ORAL_TABLET | Freq: Every day | ORAL | Status: DC

## 2013-11-13 NOTE — Patient Instructions (Addendum)
I am prescribing  you phentermine for  a short period of time to help you get to your  goal weight   Take it twice daily  1/2 tablet each time . Maximum dose 1 tablet daily  Stop it if it makes your heart race (resting pulse > 100)  o rrasies your blood pressure ( 145/85)     Phentermine tablets or capsules What is this medicine? PHENTERMINE (FEN ter meen) decreases your appetite. It is used with a reduced calorie diet and exercise to help you lose weight. This medicine may be used for other purposes; ask your health care provider or pharmacist if you have questions. COMMON BRAND NAME(S): Adipex-P, Atti-Plex P , Atti-Plex P Spansule , Fastin, Pro-Fast, Tara-8  What should I tell my health care provider before I take this medicine? They need to know if you have any of these conditions: -agitation -glaucoma -heart disease -high blood pressure -history of substance abuse -lung disease called Primary Pulmonary Hypertension (PPH) -taken an MAOI like Carbex, Eldepryl, Marplan, Nardil, or Parnate in last 14 days -thyroid disease -an unusual or allergic reaction to phentermine, other medicines, foods, dyes, or preservatives -pregnant or trying to get pregnant -breast-feeding How should I use this medicine? Take this medicine by mouth with a glass of water. Follow the directions on the prescription label. This medicine is usually taken 30 minutes before or 1 to 2 hours after breakfast. Avoid taking this medicine in the evening. It may interfere with sleep. Take your doses at regular intervals. Do not take your medicine more often than directed. Talk to your pediatrician regarding the use of this medicine in children. Special care may be needed. Overdosage: If you think you have taken too much of this medicine contact a poison control center or emergency room at once. NOTE: This medicine is only for you. Do not share this medicine with others. What if I miss a dose? If you miss a dose, take it  as soon as you can. If it is almost time for your next dose, take only that dose. Do not take double or extra doses. What may interact with this medicine? Do not take this medicine with any of the following medications: -duloxetine -MAOIs like Carbex, Eldepryl, Marplan, Nardil, and Parnate -medicines for colds or breathing difficulties like pseudoephedrine or phenylephrine -procarbazine -sibutramine -SSRIs like citalopram, escitalopram, fluoxetine, fluvoxamine, paroxetine, and sertraline -stimulants like dexmethylphenidate, methylphenidate or modafinil -venlafaxine This medicine may also interact with the following medications: -medicines for diabetes This list may not describe all possible interactions. Give your health care provider a list of all the medicines, herbs, non-prescription drugs, or dietary supplements you use. Also tell them if you smoke, drink alcohol, or use illegal drugs. Some items may interact with your medicine. What should I watch for while using this medicine? Notify your physician immediately if you become short of breath while doing your normal activities. Do not take this medicine within 6 hours of bedtime. It can keep you from getting to sleep. Avoid drinks that contain caffeine and try to stick to a regular bedtime every night. This medicine was intended to be used in addition to a healthy diet and exercise. The best results are achieved this way. This medicine is only indicated for short-term use. Eventually your weight loss may level out. At that point, the drug will only help you maintain your new weight. Do not increase or in any way change your dose without consulting your doctor. You may get drowsy  or dizzy. Do not drive, use machinery, or do anything that needs mental alertness until you know how this medicine affects you. Do not stand or sit up quickly, especially if you are an older patient. This reduces the risk of dizzy or fainting spells. Alcohol may increase  dizziness and drowsiness. Avoid alcoholic drinks. What side effects may I notice from receiving this medicine? Side effects that you should report to your doctor or health care professional as soon as possible: -chest pain, palpitations -depression or severe changes in mood -increased blood pressure -irritability -nervousness or restlessness -severe dizziness -shortness of breath -problems urinating -unusual swelling of the legs -vomiting Side effects that usually do not require medical attention (report to your doctor or health care professional if they continue or are bothersome): -blurred vision or other eye problems -changes in sexual ability or desire -constipation or diarrhea -difficulty sleeping -dry mouth or unpleasant taste -headache -nausea This list may not describe all possible side effects. Call your doctor for medical advice about side effects. You may report side effects to FDA at 1-800-FDA-1088. Where should I keep my medicine? Keep out of the reach of children. This medicine can be abused. Keep your medicine in a safe place to protect it from theft. Do not share this medicine with anyone. Selling or giving away this medicine is dangerous and against the law. Store at room temperature between 20 and 25 degrees C (68 and 77 degrees F). Keep container tightly closed. Throw away any unused medicine after the expiration date. NOTE: This sheet is a summary. It may not cover all possible information. If you have questions about this medicine, talk to your doctor, pharmacist, or health care provider.  2014, Elsevier/Gold Standard. (2010-09-24 11:02:44)

## 2013-11-13 NOTE — Progress Notes (Signed)
Patient ID: Cynthia Lewis, female   DOB: [birth year], 28 y.o.   MRN: [id number]  Subjective:     Cynthia Lewis is a 28 y.o. female and is here for a comprehensive non gyn physical exam. The patient reports as follows:.  Has been having intermittent Right hip pain and popping when she walks which becomes pai ful after running for  about 20 minutes  At a sprint .  Present for about 3 months   Has not tried pre treating with nsaids  Still training for a /body building competition.  Had to suspend efforts last Fall due ot repeated illnesses and gained back nearly 20 lbs.  She is now lisng weight with considerable effort and  exercising vigorously but not at goal and frustrated with plateuing of weight. Has increased appetite,  Thinking of trying OTC appetite suppressants.    History   Social History  . Marital Status: Single    Spouse Name: N/A    Number of Children: N/A  . Years of Education: N/A   Occupational History  . Not on file.   Social History Main Topics  . Smoking status: Never Smoker   . Smokeless tobacco: Never Used  . Alcohol Use: Yes  . Drug Use: No  . Sexual Activity: Not on file   Other Topics Concern  . Not on file   Social History Narrative  . No narrative on file   Health Maintenance  Topic Date Due  . Influenza Vaccine  03/24/2013  . Pap Smear  11/04/2015  . Tetanus/tdap  11/04/2022    The following portions of the patient's history were reviewed and updated as appropriate: allergies, current medications, past family history, past medical history, past social history, past surgical history and problem list.  Review of Systems A comprehensive review of systems was negative.   Objective:   BP 102/74  Pulse 63  Temp(Src) 98.4 F (36.9 C) (Oral)  Resp 16  Ht 5' 7.25" (1.708 m)  Wt 164 lb (74.39 kg)  BMI 25.50 kg/m2  SpO2 98%  General appearance: alert, cooperative and appears stated age Ears: normal TM's and external ear canals both  ears Throat: lips, mucosa, and tongue normal; teeth and gums normal Neck: no adenopathy, no carotid bruit, supple, symmetrical, trachea midline and thyroid not enlarged, symmetric, no tenderness/mass/nodules Back: symmetric, no curvature. ROM normal. No CVA tenderness. Lungs: clear to auscultation bilaterally Heart: regular rate and rhythm, S1, S2 normal, no murmur, click, rub or gallop Abdomen: soft, non-tender; bowel sounds normal; no masses,  no organomegaly Pulses: 2+ and symmetric Skin: Skin color, texture, turgor normal. No rashes or lesions Lymph nodes: Cervical, supraclavicular, and axillary nodes normal.   Assessment and Plan:   Routine general medical examination at a health care facility Annual comprehensive exam was done excluding breast, pelvic and PAP smear. All screenings have been addressed .   Overweight (BMI 25.0-29.9) She has had difficulty losing weight to goal due to increased appetite and is requesting a trial of  Phentermine.  She is aware of the possible side effects and risks and understands that    The medication will be discontinued if she has not lost 5% of her body weight over the next 3 months, which , based on today's weight is 16 lbs.  Anemia, iron deficiency Repeat hgb dad dropped to 10.7 in August 2014 and iron supplements were again prescribed.  Repeat hgb is due.   Screening for cervical cancer Done by GYN  Updated Medication List Outpatient Encounter Prescriptions as of 11/13/2013  Medication Sig  . Cholecalciferol (VITAMIN D3) 1000 UNITS CAPS Take by mouth.  . levonorgestrel (MIRENA) 20 MCG/24HR IUD 1 each by Intrauterine route once.  . phentermine (ADIPEX-P) 37.5 MG tablet Take 0.5 tablets (18.75 mg total) by mouth daily before breakfast.  . sulfamethoxazole-trimethoprim (SEPTRA DS) 800-160 MG per tablet Take 1 tablet by mouth 2 (two) times daily.

## 2013-11-13 NOTE — Progress Notes (Signed)
Pre-visit discussion using our clinic review tool. No additional management support is needed unless otherwise documented below in the visit note.  

## 2013-11-14 ENCOUNTER — Other Ambulatory Visit: Payer: Self-pay | Admitting: Internal Medicine

## 2013-11-14 NOTE — Assessment & Plan Note (Signed)
Done by GYN

## 2013-11-14 NOTE — Assessment & Plan Note (Signed)
Repeat hgb dad dropped to 10.7 in August 2014 and iron supplements were again prescribed.  Repeat hgb is due.

## 2013-11-14 NOTE — Assessment & Plan Note (Signed)
Annual comprehensive exam was done excluding breast, pelvic and PAP smear. All screenings have been addressed .  

## 2013-11-14 NOTE — Assessment & Plan Note (Addendum)
She has had difficulty losing weight to goal due to increased appetite and is requesting a trial of  Phentermine.  She is aware of the possible side effects and risks and understands that    The medication will be discontinued if she has not lost 5% of her body weight over the next 3 months, which , based on today's weight is 16 lbs.

## 2013-11-15 LAB — CBC WITH DIFFERENTIAL
BASOS ABS: 0 10*3/uL (ref 0.0–0.2)
Basos: 1 %
Eos: 3 %
Eosinophils Absolute: 0.2 10*3/uL (ref 0.0–0.4)
HCT: 45.3 % (ref 34.0–46.6)
Hemoglobin: 15.1 g/dL (ref 11.1–15.9)
Immature Grans (Abs): 0 10*3/uL (ref 0.0–0.1)
Immature Granulocytes: 0 %
LYMPHS ABS: 2.9 10*3/uL (ref 0.7–3.1)
LYMPHS: 44 %
MCH: 28.5 pg (ref 26.6–33.0)
MCHC: 33.3 g/dL (ref 31.5–35.7)
MCV: 86 fL (ref 79–97)
Monocytes Absolute: 0.5 10*3/uL (ref 0.1–0.9)
Monocytes: 7 %
Neutrophils Absolute: 3.1 10*3/uL (ref 1.4–7.0)
Neutrophils Relative %: 45 %
PLATELETS: 231 10*3/uL (ref 150–379)
RBC: 5.3 x10E6/uL — ABNORMAL HIGH (ref 3.77–5.28)
RDW: 18.2 % — ABNORMAL HIGH (ref 12.3–15.4)
WBC: 6.7 10*3/uL (ref 3.4–10.8)

## 2013-11-15 LAB — COMPREHENSIVE METABOLIC PANEL
ALK PHOS: 69 IU/L (ref 39–117)
ALT: 15 IU/L (ref 0–32)
AST: 22 IU/L (ref 0–40)
Albumin/Globulin Ratio: 2.4 (ref 1.1–2.5)
Albumin: 4.5 g/dL (ref 3.5–5.5)
BILIRUBIN TOTAL: 0.7 mg/dL (ref 0.0–1.2)
BUN / CREAT RATIO: 18 (ref 8–20)
BUN: 22 mg/dL — AB (ref 6–20)
CHLORIDE: 102 mmol/L (ref 97–108)
CO2: 24 mmol/L (ref 18–29)
Calcium: 9.6 mg/dL (ref 8.7–10.2)
Creatinine, Ser: 1.19 mg/dL — ABNORMAL HIGH (ref 0.57–1.00)
GFR calc Af Amer: 73 mL/min/{1.73_m2} (ref >59)
GFR calc non Af Amer: 63 mL/min/{1.73_m2} (ref >59)
Globulin, Total: 1.9 g/dL (ref 1.5–4.5)
Glucose: 75 mg/dL (ref 65–99)
POTASSIUM: 4.9 mmol/L (ref 3.5–5.2)
Sodium: 142 mmol/L (ref 134–144)
Total Protein: 6.4 g/dL (ref 6.0–8.5)

## 2013-11-15 LAB — LIPID PANEL W/O CHOL/HDL RATIO
CHOLESTEROL TOTAL: 134 mg/dL (ref 100–199)
HDL: 45 mg/dL (ref >39)
LDL Calculated: 70 mg/dL (ref 0–99)
TRIGLYCERIDES: 97 mg/dL (ref 0–149)
VLDL Cholesterol Cal: 19 mg/dL (ref 5–40)

## 2013-11-15 LAB — TSH: TSH: 3.19 u[IU]/mL (ref 0.450–4.500)

## 2013-11-15 LAB — VITAMIN D 25 HYDROXY (VIT D DEFICIENCY, FRACTURES): Vit D, 25-Hydroxy: 45.8 ng/mL (ref 30.0–100.0)

## 2013-11-16 ENCOUNTER — Encounter: Payer: Self-pay | Admitting: Internal Medicine

## 2013-11-19 ENCOUNTER — Telehealth: Payer: Self-pay | Admitting: Internal Medicine

## 2013-11-20 ENCOUNTER — Ambulatory Visit: Payer: 59

## 2013-11-20 ENCOUNTER — Other Ambulatory Visit: Payer: Self-pay | Admitting: *Deleted

## 2013-11-21 ENCOUNTER — Other Ambulatory Visit: Payer: Self-pay | Admitting: *Deleted

## 2013-11-21 NOTE — Telephone Encounter (Signed)
Mailed unread message to pt  

## 2013-11-23 ENCOUNTER — Ambulatory Visit (INDEPENDENT_AMBULATORY_CARE_PROVIDER_SITE_OTHER): Payer: 59 | Admitting: *Deleted

## 2013-11-23 ENCOUNTER — Telehealth: Payer: Self-pay | Admitting: *Deleted

## 2013-11-23 VITALS — BP 108/72 | HR 78

## 2013-11-23 DIAGNOSIS — Z0130 Encounter for examination of blood pressure without abnormal findings: Secondary | ICD-10-CM

## 2013-11-23 DIAGNOSIS — I10 Essential (primary) hypertension: Secondary | ICD-10-CM

## 2013-11-23 NOTE — Telephone Encounter (Signed)
Pt presents for BP check. Taking Phentermine 1/2 tablet bid, tolerating without difficulty. BP in office 108/72 and HR 78.

## 2013-11-23 NOTE — Progress Notes (Signed)
Pt presents for BP check. Taking Phentermine 1/2 tablet bid, tolerating without difficulty. BP in office 108/72 and HR 78.  

## 2013-11-23 NOTE — Telephone Encounter (Signed)
Can continue phentermine

## 2013-12-11 ENCOUNTER — Ambulatory Visit: Payer: 59 | Admitting: Internal Medicine

## 2013-12-11 ENCOUNTER — Encounter: Payer: Self-pay | Admitting: Internal Medicine

## 2013-12-11 ENCOUNTER — Telehealth: Payer: Self-pay | Admitting: Internal Medicine

## 2013-12-11 DIAGNOSIS — R7989 Other specified abnormal findings of blood chemistry: Secondary | ICD-10-CM | POA: Insufficient documentation

## 2013-12-11 NOTE — Telephone Encounter (Signed)
The patient stated she was sent a my chart message by Dr. Darrick Huntsmanullo to have her labs redrawn for a creatinine level. She is wanting orders sent to [facility] [place] [facility] [place] to have those drawn.

## 2013-12-11 NOTE — Telephone Encounter (Signed)
Please advise. Placed lab order in red folder.

## 2013-12-12 NOTE — Telephone Encounter (Signed)
Patient notified lab request has been faxed to [facility] as requested by patient.

## 2013-12-18 LAB — BASIC METABOLIC PANEL
BUN: 11 mg/dL (ref 4–21)
CREATININE: 1.1 mg/dL (ref 0.5–1.1)
Glucose: 83 mg/dL
Potassium: 4.4 mmol/L (ref 3.4–5.3)
Sodium: 139 mmol/L (ref 137–147)

## 2013-12-21 ENCOUNTER — Telehealth: Payer: Self-pay | Admitting: Internal Medicine

## 2013-12-21 DIAGNOSIS — R7989 Other specified abnormal findings of blood chemistry: Secondary | ICD-10-CM

## 2013-12-21 NOTE — Assessment & Plan Note (Signed)
Repeat BUN is 11 and Cr 1.11

## 2013-12-21 NOTE — Telephone Encounter (Signed)
Left detailed message (per DPR) on cell phone.  

## 2013-12-21 NOTE — Telephone Encounter (Signed)
I do not have your previous labs for comparison bc they have not been scanned in  Since you get them done at [facility],.  but i believe your current labs show improved kidney function and improved hydration

## 2013-12-22 ENCOUNTER — Encounter: Payer: Self-pay | Admitting: Internal Medicine

## 2014-01-25 ENCOUNTER — Encounter: Payer: Self-pay | Admitting: Internal Medicine

## 2014-01-25 MED ORDER — PHENTERMINE HCL 37.5 MG PO TABS: 19.0000 mg | ORAL_TABLET | Freq: Every day | ORAL | Status: DC

## 2014-01-25 NOTE — Telephone Encounter (Signed)
Patient can only take meds for total of 3 months; has to prove wt loss before refilling.  30 days only , needs  RN appt for weigh in

## 2014-01-25 NOTE — Telephone Encounter (Signed)
Last visit 11/13/13, refill?

## 2014-01-30 ENCOUNTER — Ambulatory Visit (INDEPENDENT_AMBULATORY_CARE_PROVIDER_SITE_OTHER): Payer: 59 | Admitting: *Deleted

## 2014-01-30 VITALS — BP 118/70 | HR 66 | Temp 99.3°F | Resp 16 | Wt 148.5 lb

## 2014-01-30 DIAGNOSIS — Z79899 Other long term (current) drug therapy: Secondary | ICD-10-CM

## 2014-01-30 DIAGNOSIS — E663 Overweight: Secondary | ICD-10-CM

## 2014-01-30 LAB — HEIGHT AND WEIGHT: PATIENT WEIGHT (LBS): 148.8

## 2014-01-30 MED ORDER — PHENTERMINE HCL 37.5 MG PO TABS: 19.0000 mg | ORAL_TABLET | Freq: Every day | ORAL | Status: DC

## 2014-01-30 NOTE — Progress Notes (Signed)
Patient ID: Cynthia Lewis, female   DOB: 1985-12-05, 28 y.o.   MRN: [id number] Follow up on overweight, managed with pharmacotherapy  (phentermine) .  Patient returns for weight measurement.

## 2014-01-30 NOTE — Assessment & Plan Note (Addendum)
She has lost 16 lbs, or 10% of her body weight since march.  Will authorize its use for one more month as requested.

## 2014-01-31 ENCOUNTER — Other Ambulatory Visit: Payer: Self-pay | Admitting: Internal Medicine

## 2014-01-31 ENCOUNTER — Encounter: Payer: Self-pay | Admitting: *Deleted

## 2014-02-01 MED ORDER — PHENTERMINE HCL 37.5 MG PO TABS: 37.5000 mg | ORAL_TABLET | Freq: Every day | ORAL | Status: DC

## 2014-02-01 MED ORDER — PHENTERMINE HCL 37.5 MG PO TABS: 19.0000 mg | ORAL_TABLET | Freq: Every day | ORAL | Status: DC

## 2014-02-01 NOTE — Telephone Encounter (Signed)
i had to change the phentermine rx to read 37.5 mg one tablet daily,  disregard the previously printed one.

## 2014-09-17 ENCOUNTER — Encounter: Payer: Self-pay | Admitting: Internal Medicine

## 2014-10-01 ENCOUNTER — Ambulatory Visit (INDEPENDENT_AMBULATORY_CARE_PROVIDER_SITE_OTHER): Payer: 59 | Admitting: Internal Medicine

## 2014-10-01 ENCOUNTER — Encounter: Payer: Self-pay | Admitting: Internal Medicine

## 2014-10-01 VITALS — BP 118/78 | HR 82 | Temp 98.6°F | Resp 16 | Ht 66.0 in | Wt 179.5 lb

## 2014-10-01 DIAGNOSIS — D509 Iron deficiency anemia, unspecified: Secondary | ICD-10-CM

## 2014-10-01 DIAGNOSIS — E663 Overweight: Secondary | ICD-10-CM

## 2014-10-01 NOTE — Progress Notes (Signed)
Pre-visit discussion using our clinic review tool. No additional management support is needed unless otherwise documented below in the visit note.  

## 2014-10-01 NOTE — Patient Instructions (Signed)

## 2014-10-01 NOTE — Assessment & Plan Note (Addendum)
Repeat iron studies are pending, patient has a Mirena IUD as does not menstruate, but has symptoms of IDA.

## 2014-10-01 NOTE — Progress Notes (Signed)
Patient ID: Cynthia Lewis, female   DOB: 28-Nov-1985, 28 y.o.   MRN: [id number]  Patient Active Problem List   Diagnosis Date Noted  . Prerenal azotemia 12/11/2013  . Anemia, iron deficiency 11/10/2012  . Anxiety state, unspecified 11/05/2012  . Routine general medical examination at a health care facility 11/05/2012  . Overweight (BMI 25.0-29.9) 08/05/2011  . Screening for cervical cancer 08/05/2011    Subjective:  CC:   Chief Complaint  Patient presents with  . Annual Exam    HPI:     Cynthia Lewis is a 29 y.o. female who presents for a comprehensive nongyn exam and follow up on obesity. Over the summer she achieved significant weight loss using phentermine , carb restricted  diet and and  Weight training and aerobic excess averaging 2 hours daily.  she she lowered her weight to 135 lbs,  Which was a BMI  Of 21.8 and competed and placed 2nd in a local body building/bikini competition.  However, once she stopped the phentermine , the strict diet and aggressive program of daily exercise,  she  has gained 30 lbs. She is frustrated but has no other issues today.  She has met with a nutritionist and is starting to resume a less stringent diet and exercise plan that is more realistic and likely to be adhered to. .  Past Medical History  Diagnosis Date  . Hypertension     No past surgical history on file.     The following portions of the patient's history were reviewed and updated as appropriate: Allergies, current medications, and problem list.    Review of Systems:   Patient denies headache, fevers, malaise, unintentional weight loss, skin rash, eye pain, sinus congestion and sinus pain, sore throat, dysphagia,  hemoptysis , cough, dyspnea, wheezing, chest pain, palpitations, orthopnea, edema, abdominal pain, nausea, melena, diarrhea, constipation, flank pain, dysuria, hematuria, urinary  Frequency, nocturia, numbness, tingling, seizures,  Focal weakness, Loss of  consciousness,  Tremor, insomnia, depression, anxiety, and suicidal ideation.     History   Social History  . Marital Status: Single    Spouse Name: N/A    Number of Children: N/A  . Years of Education: N/A   Occupational History  . Not on file.   Social History Main Topics  . Smoking status: Never Smoker   . Smokeless tobacco: Never Used  . Alcohol Use: Yes  . Drug Use: No  . Sexual Activity: Not on file   Other Topics Concern  . Not on file   Social History Narrative    Objective:  Filed Vitals:   10/01/14 1335  BP: 118/78  Pulse: 82  Temp: 98.6 F (37 C)  Resp: 16     General appearance: alert, cooperative and appears stated age Ears: normal TM's and external ear canals both ears Throat: lips, mucosa, and tongue normal; teeth and gums normal Neck: no adenopathy, no carotid bruit, supple, symmetrical, trachea midline and thyroid not enlarged, symmetric, no tenderness/mass/nodules Back: symmetric, no curvature. ROM normal. No CVA tenderness. Lungs: clear to auscultation bilaterally Heart: regular rate and rhythm, S1, S2 normal, no murmur, click, rub or gallop Abdomen: soft, non-tender; bowel sounds normal; no masses,  no organomegaly Pulses: 2+ and symmetric Skin: Skin color, texture, turgor normal. No rashes or lesions Lymph nodes: Cervical, supraclavicular, and axillary nodes normal.  Assessment and Plan:  Overweight (BMI 25.0-29.9) Weight gain discussed.  I have addressed  BMI and recommended a low glycemic index diet utilizing smaller  more frequent meals to increase metabolism.  I have also recommended that patient resume regular  exercising with a goal of 30 minutes of aerobic exercise a minimum of 5 days per week. Screening for lipid disorders, thyroid and diabetes to be done at [facility].    Anemia, iron deficiency Repeat iron studies are pending, patient has a Mirena IUD as does not menstruate, but has symptoms of IDA.    A total of 25 minutes of  face to face time was spent with patient more than half of which was spent in counselling about the above mentioned conditions  and coordination of care  Updated Medication List Outpatient Encounter Prescriptions as of 10/01/2014  Medication Sig  . ABSORICA 40 MG capsule Take 1 tablet by mouth daily.  . Cholecalciferol (VITAMIN D3) 1000 UNITS CAPS Take by mouth.  . levonorgestrel (MIRENA) 20 MCG/24HR IUD 1 each by Intrauterine route once.  . [DISCONTINUED] phentermine (ADIPEX-P) 37.5 MG tablet Take 1 tablet (37.5 mg total) by mouth daily before breakfast. (Patient not taking: Reported on 10/01/2014)  . [DISCONTINUED] sulfamethoxazole-trimethoprim (SEPTRA DS) 800-160 MG per tablet Take 1 tablet by mouth 2 (two) times daily. (Patient not taking: Reported on 10/01/2014)     No orders of the defined types were placed in this encounter.    Return in about 1 year (around 10/02/2015).

## 2014-10-01 NOTE — Assessment & Plan Note (Addendum)
Weight gain discussed.  I have addressed  BMI and recommended a low glycemic index diet utilizing smaller more frequent meals to increase metabolism.  I have also recommended that patient resume regular  exercising with a goal of 30 minutes of aerobic exercise a minimum of 5 days per week. Screening for lipid disorders, thyroid and diabetes to be done at [facility].

## 2014-10-03 LAB — BASIC METABOLIC PANEL
BUN: 14 mg/dL (ref 4–21)
Creatinine: 1 mg/dL (ref 0.5–1.1)
GLUCOSE: 81 mg/dL
Potassium: 4.5 mmol/L (ref 3.4–5.3)
Sodium: 139 mmol/L (ref 137–147)

## 2014-10-03 LAB — TSH: TSH: 2.07 u[IU]/mL (ref 0.41–5.90)

## 2014-10-03 LAB — HEPATIC FUNCTION PANEL
ALT: 7 U/L (ref 7–35)
AST: 14 U/L (ref 13–35)
Alkaline Phosphatase: 58 U/L (ref 25–125)
Bilirubin, Total: 0.6 mg/dL

## 2014-10-03 LAB — LIPID PANEL
Cholesterol: 156 mg/dL (ref 0–200)
HDL: 61 mg/dL (ref 35–70)
LDL CALC: 78 mg/dL
Triglycerides: 84 mg/dL (ref 40–160)

## 2014-10-08 ENCOUNTER — Telehealth: Payer: Self-pay | Admitting: Internal Medicine

## 2014-10-08 NOTE — Telephone Encounter (Signed)
My chart message sent re labs

## 2014-10-30 ENCOUNTER — Ambulatory Visit (INDEPENDENT_AMBULATORY_CARE_PROVIDER_SITE_OTHER): Payer: 59 | Admitting: Nurse Practitioner

## 2014-10-30 ENCOUNTER — Encounter: Payer: Self-pay | Admitting: Nurse Practitioner

## 2014-10-30 ENCOUNTER — Encounter: Payer: Self-pay | Admitting: Internal Medicine

## 2014-10-30 VITALS — BP 110/72 | HR 83 | Temp 98.9°F | Resp 14 | Ht 66.0 in | Wt 177.4 lb

## 2014-10-30 DIAGNOSIS — J069 Acute upper respiratory infection, unspecified: Secondary | ICD-10-CM

## 2014-10-30 DIAGNOSIS — B9789 Other viral agents as the cause of diseases classified elsewhere: Principal | ICD-10-CM

## 2014-10-30 MED ORDER — BENZONATATE 100 MG PO CAPS: 100.0000 mg | ORAL_CAPSULE | Freq: Two times a day (BID) | ORAL | Status: DC | PRN

## 2014-10-30 MED ORDER — HYDROCODONE-HOMATROPINE 5-1.5 MG/5ML PO SYRP: 5.0000 mL | ORAL_SOLUTION | Freq: Three times a day (TID) | ORAL | Status: DC | PRN

## 2014-10-30 NOTE — Progress Notes (Signed)
Pre visit review using our clinic review tool, if applicable. No additional management support is needed unless otherwise documented below in the visit note. 

## 2014-10-30 NOTE — Patient Instructions (Signed)
Call us or send a MyChart if you get to day 10 with no improvement.

## 2014-10-30 NOTE — Progress Notes (Signed)
Subjective:    Patient ID: Cynthia Lewis, female    DOB: June 02, 1986, 29 y.o.   MRN: [id number]  HPI  Cynthia Lewis is a 29 yo cough x 3 days.   1) Saturday got a cough, 100.00 temp, diaphoretic, dry and productive on and off, headache, Monday- cough, ibuprofen, rhinorrhea, nasal congestion. Cough more productive when lying down  Delsym, Nyquil- Not helpful  Cough drops- not helpful    Review of Systems  Constitutional: Positive for diaphoresis. Negative for fever, chills and fatigue.  HENT: Positive for congestion and rhinorrhea. Negative for ear discharge, ear pain, postnasal drip, sinus pressure, sneezing and sore throat.   Eyes: Negative for visual disturbance.  Respiratory: Positive for cough. Negative for chest tightness, shortness of breath and wheezing.   Cardiovascular: Negative for chest pain, palpitations and leg swelling.  Gastrointestinal: Negative for nausea, vomiting and diarrhea.  Musculoskeletal: Negative for myalgias.  Skin: Negative for rash.  Neurological: Positive for headaches. Negative for dizziness, weakness and numbness.  Psychiatric/Behavioral: The patient is not nervous/anxious.    Past Medical History  Diagnosis Date  . Hypertension     History   Social History  . Marital Status: Single    Spouse Name: N/A  . Number of Children: N/A  . Years of Education: N/A   Occupational History  . Not on file.   Social History Main Topics  . Smoking status: Never Smoker   . Smokeless tobacco: Never Used  . Alcohol Use: Yes  . Drug Use: No  . Sexual Activity: Not on file   Other Topics Concern  . Not on file   Social History Narrative    No past surgical history on file.  Family History  Problem Relation Age of Onset  . Cancer Paternal Grandmother 51    breast ca    Allergies  Allergen Reactions  . Amoxicillin Nausea And Vomiting    Current Outpatient Prescriptions on File Prior to Visit  Medication Sig Dispense Refill  .  Cholecalciferol (VITAMIN D3) 1000 UNITS CAPS Take by mouth.    . levonorgestrel (MIRENA) 20 MCG/24HR IUD 1 each by Intrauterine route once.     No current facility-administered medications on file prior to visit.      Objective:   Physical Exam  Constitutional: She is oriented to person, place, and time. She appears well-developed and well-nourished. No distress.  BP 110/72 mmHg  Pulse 83  Temp(Src) 98.9 F (37.2 C) (Oral)  Resp 14  Ht  (1.676 m)  Wt 177 lb 6.4 oz (80.468 kg)  BMI 28.65 kg/m2  SpO2 97%   HENT:  Head: Normocephalic and atraumatic.  Right Ear: External ear normal.  Left Ear: External ear normal.  Eyes: EOM are normal. Pupils are equal, round, and reactive to light. Right eye exhibits no discharge. Left eye exhibits no discharge. No scleral icterus.  Neck: Normal range of motion. Neck supple. No thyromegaly present.  Cardiovascular: Normal rate, regular rhythm, normal heart sounds and intact distal pulses.  Exam reveals no gallop and no friction rub.   No murmur heard. Pulmonary/Chest: Effort normal and breath sounds normal. No respiratory distress. She has no wheezes. She has no rales. She exhibits no tenderness.  Lymphadenopathy:    She has no cervical adenopathy.  Neurological: She is alert and oriented to person, place, and time. No cranial nerve deficit. She exhibits normal muscle tone. Coordination normal.  Skin: Skin is warm and dry. No rash noted. She is not diaphoretic.  Psychiatric: She has a normal mood and affect. Her behavior is normal. Judgment and thought content normal.      Assessment & Plan:  Viral URI with Cough  1) Tessalon perles for cough in the day 2) Hycodan syrup 1 tsp at night for cough  3) Contact us if not improving at day 10 or if worsening

## 2015-11-25 ENCOUNTER — Ambulatory Visit (INDEPENDENT_AMBULATORY_CARE_PROVIDER_SITE_OTHER): Payer: Self-pay | Admitting: Family Medicine

## 2015-11-25 ENCOUNTER — Encounter: Payer: Self-pay | Admitting: Family Medicine

## 2015-11-25 MED ORDER — CYCLOBENZAPRINE HCL 10 MG PO TABS: 10.0000 mg | ORAL_TABLET | Freq: Three times a day (TID) | ORAL | Status: DC | PRN
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
## 2015-11-25 NOTE — Progress Notes (Signed)
Pre visit review using our clinic review tool, if applicable. No additional management support is needed unless otherwise documented below in the visit note. 

## 2016-02-14 ENCOUNTER — Telehealth: Payer: Self-pay | Admitting: Internal Medicine

## 2016-02-14 DIAGNOSIS — F419 Anxiety disorder, unspecified: Secondary | ICD-10-CM | POA: Insufficient documentation

## 2016-02-14 NOTE — Telephone Encounter (Signed)
Patient has been notified

## 2016-02-14 NOTE — Telephone Encounter (Signed)
Scheduled patient for 02/24/16 at 3.30 please notify patient.

## 2016-02-14 NOTE — Telephone Encounter (Signed)
Pt called about having some anxiety that has been going on for about 2 weeks to a month now and it's starting to get really bad. No appt avail until 07/18 pt wants to come in sooner. Let me know if there is some where we can sch pt? Thank you!  Call pt @ [phone].

## 2016-02-14 NOTE — Telephone Encounter (Signed)
Im sorry Cynthia Lewis is there anything before said date that Dr. Darrick Huntsmantullo would be ok with?

## 2016-02-24 ENCOUNTER — Encounter: Payer: Self-pay | Admitting: Internal Medicine

## 2016-02-24 ENCOUNTER — Ambulatory Visit (INDEPENDENT_AMBULATORY_CARE_PROVIDER_SITE_OTHER): Payer: 59 | Admitting: Internal Medicine

## 2016-02-24 VITALS — BP 138/98 | HR 98 | Temp 99.1°F | Resp 12 | Ht 66.0 in | Wt 210.2 lb

## 2016-02-24 DIAGNOSIS — F411 Generalized anxiety disorder: Secondary | ICD-10-CM | POA: Diagnosis not present

## 2016-02-24 DIAGNOSIS — E669 Obesity, unspecified: Secondary | ICD-10-CM

## 2016-02-24 MED ORDER — CITALOPRAM HYDROBROMIDE 20 MG PO TABS: 20.0000 mg | ORAL_TABLET | Freq: Every day | ORAL | Status: DC

## 2016-02-24 MED ORDER — ALPRAZOLAM 0.25 MG PO TABS: 0.2500 mg | ORAL_TABLET | Freq: Two times a day (BID) | ORAL | Status: DC | PRN

## 2016-02-24 NOTE — Progress Notes (Signed)
Pre-visit discussion using our clinic review tool. No additional management support is needed unless otherwise documented below in the visit note.  

## 2016-02-24 NOTE — Patient Instructions (Signed)
.  Please start the Celexa (citalopram) at 1/2 tablet daily in the evening for the first few days to avoid nausea.  You can increase to a full tablet after 4 days if you have  not developed side effects of nausea.  If the Celexa interferes with your sleep, take it in the morning instead.  Ok to increase the dose to 40 mg after 2 weeks of 20 mg dose if you do not feel tha tyour anxeity is better controlled,  But let me know via mychart. . If needed   Please return in  4 weeks   Alprazolam for use for panic attacks,  Or extremely bad days.  DO NOT COMBINE WITH ALCOHOL OR SHARRE WITH OTHERS.    STOP SKIPPING MEALS.     try a premixed protein drink called Premier Protein shake in the morning.  It is less $$$ and very low sugar.    160 cal  30 g protein  1 g sugar 50% calcium needs    Vanilla, chocolate, caramel  Are the best
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
## 2016-02-25 NOTE — Assessment & Plan Note (Signed)
Weight gain discussed.  I have addressed  BMI and recommended a low glycemic index diet utilizing smaller more frequent meals to increase metabolism.  I have also recommended that patient resume regular  exercising with a goal of 30 minutes of aerobic exercise a minimum of 5 days per week.

## 2016-02-25 NOTE — Assessment & Plan Note (Signed)
Aggravated by prolonged estrangement from boyfriend and impending transition complicated by increasing work evnironment disharmony and dysfunction. Afdvised trial of SSRI and prn alprazolam low dose.

## 2016-05-04 ENCOUNTER — Other Ambulatory Visit: Payer: Self-pay | Admitting: Internal Medicine

## 2016-05-04 NOTE — Telephone Encounter (Signed)
Pt called about wanting to inform that her medications have to go [facility] [place] [facility] [id number]  Pt states [facility] already sent request she just wants to verify.  Medications are ALPRAZolam (XANAX) 0.25 MG tablet and citalopram (CELEXA) 20 MG tablet.   Call pt @ [phone]. Thank you!

## 2016-05-04 NOTE — Telephone Encounter (Signed)
Please advise refill, thanks last filled in July 2017

## 2016-05-05 MED ORDER — CITALOPRAM HYDROBROMIDE 20 MG PO TABS
20.0000 mg | ORAL_TABLET | Freq: Every day | ORAL | 1 refills | Status: DC
Start: 2016-05-05 — End: 2016-09-25

## 2016-05-05 MED ORDER — ALPRAZOLAM 0.25 MG PO TABS: 0.2500 mg | ORAL_TABLET | Freq: Two times a day (BID) | ORAL | 3 refills | Status: DC | PRN

## 2016-05-05 NOTE — Telephone Encounter (Signed)
MY POLICY IS THAT I NO LONGER FILL CONTROLLED SUBSTANCES FOR 90 DAYS DUE TO THE RISK OF OVERDOSE WITH THAT MANY PILLS AVAILABLE.  I HAVE REFILLED the alprazolam for 30 days only. The other was sent to mail order.   Please start giving me the last OV for controlled substance refill requests

## 2016-05-06 ENCOUNTER — Telehealth: Payer: Self-pay

## 2016-05-06 NOTE — Telephone Encounter (Signed)
Lmom for patient to cb rx celexa has been sent to mail pharmacy and her xanax is being sent to local pharmacy

## 2016-05-06 NOTE — Telephone Encounter (Signed)
Faxed a xanax to pharmacy it failed so I called in rx to pharmacy

## 2016-06-18 ENCOUNTER — Encounter: Payer: Self-pay | Admitting: Internal Medicine

## 2016-06-18 DIAGNOSIS — F411 Generalized anxiety disorder: Secondary | ICD-10-CM

## 2016-07-09 ENCOUNTER — Encounter: Payer: Self-pay | Admitting: Licensed Clinical Social Worker

## 2016-07-09 ENCOUNTER — Ambulatory Visit (INDEPENDENT_AMBULATORY_CARE_PROVIDER_SITE_OTHER): Payer: 59 | Admitting: Licensed Clinical Social Worker

## 2016-07-09 DIAGNOSIS — F331 Major depressive disorder, recurrent, moderate: Secondary | ICD-10-CM
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
## 2016-07-30 ENCOUNTER — Ambulatory Visit (INDEPENDENT_AMBULATORY_CARE_PROVIDER_SITE_OTHER): Payer: 59 | Admitting: Licensed Clinical Social Worker

## 2016-07-30 DIAGNOSIS — F331 Major depressive disorder, recurrent, moderate: Secondary | ICD-10-CM | POA: Diagnosis not present

## 2016-07-30 NOTE — Progress Notes (Signed)
THERAPIST PROGRESS NOTE  Session Time: 2 PM to 2:50 PM  Participation Level: Active  Behavioral Response: CasualAlertEuthymic  Type of Therapy: Individual Therapy  Treatment Goals addressed:  increase self-esteem, work on healthy interpersonal relationship skills and learn coping strategies for managing anxiety more effectively  Interventions: CBT, Solution Focused, Strength-based, Supportive and Other: A skills to build self-esteem, healthy interpersonal relationships  Summary: Cynthia CovertKathryn E Lewis is a 30 y.o. female who presents with doing pretty well because of what is going on with her work. She has two weeks off before she transitions to her new position. She plans to go visit friend in [place]. Reviewed how she felt about her relationship and related that her relationship was limiting. He would not want to go places because of finances. She is making plans to do stuff. Shared that she found out two weeks ago that ex and girl who was trying to be her friend is dating her ex. She is angry that they think it is okay. Shared that the relationship is officially over and she is done and ready to move on. She has no need to figure out what is going and moving on. She learned from this if you get to point of taking medciation because of your anxiety then you didn't need to be with that person. There are things she is not going to put up with. She was relieved when the relationship was over, as she had anxiety because of that their relationship. She has self-esteem in different situations but with him she did not have self-esteem because she perceived him as having a lot of great qualities. She is out there now looking for somebody to connect with. Relates that she has to get out there or she will stay were she is. She discussed some of the continuing issues with anxiety. She is supposed to show up for meet up groups for traveling and her concern is that she is meeting strangers and not fitting in. She  is going into an unknown situation where she doesn't know anyone and doesn't like to feel like she is standing out. She feels awkward in these situations. She will be transitioning to new position, and feels positive about it, hopeful that environment will be more positive. Reviewed ongoing therapy sessions and patient feels she is doing pretty well so she will see how things go and if needed will call to schedule another appointment.  Suicidal/Homicidal: No  Therapist Response: Reviewed patient's progress and symptoms. Helped patient emotionally process through breakup and identified that she is at a lot better place. Shared that it helped with patient self-esteem that she has learn from experience to trust her judgment more and that she won't put up with what she had put up with in the previous relationship. Provided interventions and self-esteem building and challenging patient's perceptions of previous relationship that cause patient self-esteem to be lower. Provided positive feedback for positive coping and putting efforts to getting out to meet people and socialize. Provided positive feedback for patient engaging in positive activities such as traveling and doing yoga. Addressed anxiety issues and shared coping strategies for anxiety including exposure and that anxiety will decrease the more times patient participates in social groups, challenging cognitive distortions such as mind reading, and not allowing other people's perceptions to limit but she wants to do. Explained how motivational  also help overcome anxiety as her desire to do something can also help overcome her anxiety.  Encouraged patient in her insights about  healthy relationship skills and focusing on how that person makes her feel in determining whether she wants to be in the relationship. Provided strength based and supportive interventions.   Plan: 1. Patient does not request any further services at this time due to progress she has  made so she will see how things go to determine whether further sessions are needed.2. Patient will continue to gain insight to strategies to build self-esteem, healthy interpersonal skills, skills to manage anxiety and implement an to her daily life  Diagnosis: Axis I:  major depressive disorder, recurrent, moderate    Axis II: No diagnosis    Tiffony Kite A, LCSW 07/30/2016

## 2016-09-15 ENCOUNTER — Ambulatory Visit: Payer: 59 | Admitting: Psychiatry

## 2016-09-25 ENCOUNTER — Other Ambulatory Visit: Payer: Self-pay | Admitting: Internal Medicine

## 2016-09-28 ENCOUNTER — Ambulatory Visit (INDEPENDENT_AMBULATORY_CARE_PROVIDER_SITE_OTHER): Payer: 59

## 2016-09-28 ENCOUNTER — Ambulatory Visit (INDEPENDENT_AMBULATORY_CARE_PROVIDER_SITE_OTHER): Payer: 59 | Admitting: Family Medicine

## 2016-09-28 ENCOUNTER — Encounter: Payer: Self-pay | Admitting: Family Medicine

## 2016-09-28 VITALS — BP 114/84 | HR 105 | Temp 99.1°F | Wt 221.8 lb

## 2016-09-28 DIAGNOSIS — J988 Other specified respiratory disorders: Secondary | ICD-10-CM

## 2016-09-28 LAB — POCT RAPID STREP A (OFFICE): Rapid Strep A Screen: NEGATIVE

## 2016-09-28 MED ORDER — HYDROCOD POLST-CPM POLST ER 10-8 MG/5ML PO SUER: 5.0000 mL | Freq: Two times a day (BID) | ORAL | 0 refills | Status: DC | PRN

## 2016-09-28 NOTE — Progress Notes (Signed)
Pre visit review using our clinic review tool, if applicable. No additional management support is needed unless otherwise documented below in the visit note. 

## 2016-09-28 NOTE — Progress Notes (Signed)
Subjective:  Patient ID: Cynthia Lewis, female    DOB: 05/14/1986  Age: 31 y.o. MRN: [id number]  CC: Fever, ST, Cough  HPI:  31 year old female presents with with the above complaints.  Patient has been sick since Wednesday. She's had fever, body aches, chills. She's also had sore throat and productive cough. Cough productive of yellow sputum. She also reports associated congestion. Symptoms has continued to persist. She's been using over-the-counter Delsym, Advil cold, and Tylenol. No known exacerbating factors. No reported strep exposure. No reported flu exposure. No other complaints or concerns at this time.  Social Hx   Social History   Social History  . Marital status: Single    Spouse name: N/A  . Number of children: N/A  . Years of education: N/A   Social History Main Topics  . Smoking status: Never Smoker  . Smokeless tobacco: Never Used  . Alcohol use Yes  . Drug use: No  . Sexual activity: Not Asked   Other Topics Concern  . None   Social History Narrative  . None   Review of Systems  Constitutional: Positive for chills and fever.  HENT: Positive for congestion and sore throat.   Respiratory: Positive for cough.    Objective:  BP 114/84   Pulse (!) 105   Temp 99.1 F (37.3 C) (Oral)   Wt 221 lb 12.8 oz (100.6 kg)   SpO2 97%   BMI 35.80 kg/m   BP/Weight 09/28/2016 02/24/2016 11/25/2015  Systolic BP 114 138 128  Diastolic BP 84 98 86  Wt. (Lbs) 221.8 210.25 215.6  BMI 35.8 33.95 34.82   Physical Exam  Constitutional: She is oriented to person, place, and time.  Sweating but in no acute distress.  HENT:  Mouth/Throat: Oropharynx is clear and moist.  Normal TM's bilaterally.  Cardiovascular: Regular rhythm.   Tachycardia.  Pulmonary/Chest: Effort normal.  Posteriorly (Right upper) with coarse breath sounds and wheezing.  Neurological: She is alert and oriented to person, place, and time.  Vitals reviewed.  Lab Results  Component Value Date   WBC 6.7 11/14/2013   HGB 15.1 11/14/2013   HCT 45.3 11/14/2013   PLT 231 11/14/2013   GLUCOSE 75 11/14/2013   CHOL 156 10/03/2014   TRIG 84 10/03/2014   HDL 61 10/03/2014   LDLCALC 78 10/03/2014   ALT 7 10/03/2014   AST 14 10/03/2014   NA 139 10/03/2014   K 4.5 10/03/2014   CL 102 11/14/2013   CREATININE 1.0 10/03/2014   BUN 14 10/03/2014   CO2 24 11/14/2013   TSH 2.07 10/03/2014    Assessment & Plan:   Problem List Items Addressed This Visit    Respiratory infection - Primary    New problem. Given history, I suspect the patient has ongoing influenza. She is outside of the treatment window. Rapid strep negative. Given lung findings and symptoms, obtaining chest x-ray.  Treating cough with Tussionex. We'll determine need for antibiotic based on xray findings.      Relevant Medications   chlorpheniramine-HYDROcodone (TUSSIONEX PENNKINETIC ER) 10-8 MG/5ML SUER   Other Relevant Orders   DG Chest 2 View   POCT rapid strep A (Completed)     Meds ordered this encounter  Medications  . chlorpheniramine-HYDROcodone (TUSSIONEX PENNKINETIC ER) 10-8 MG/5ML SUER    Sig: Take 5 mLs by mouth every 12 (twelve) hours as needed.    Dispense:  115 mL    Refill:  0   Follow-up: PRN  [place]

## 2016-09-28 NOTE — Assessment & Plan Note (Signed)
New problem. Given history, I suspect the patient has ongoing influenza. She is outside of the treatment window. Rapid strep negative. Given lung findings and symptoms, obtaining chest x-ray.  Treating cough with Tussionex. We'll determine need for antibiotic based on xray findings.

## 2016-09-28 NOTE — Patient Instructions (Signed)
I suspect that this may be from influenza (but you are outside of the treatment window).  Use the cough medication as needed.  We will call with your chest xray result.  Take care  Dr. Adriana Simasook

## 2017-03-14 ENCOUNTER — Ambulatory Visit
Admission: EM | Admit: 2017-03-14 | Discharge: 2017-03-14 | Disposition: A | Discharge status: Home / Self Care | Payer: 59 | Attending: Emergency Medicine | Admitting: Emergency Medicine

## 2017-03-14 DIAGNOSIS — N3001 Acute cystitis with hematuria: Secondary | ICD-10-CM

## 2017-03-14 HISTORY — DX: Anxiety disorder, unspecified: F41.9

## 2017-03-14 LAB — URINALYSIS, COMPLETE (UACMP) WITH MICROSCOPIC
BILIRUBIN URINE: NEGATIVE
Glucose, UA: NEGATIVE mg/dL
Ketones, ur: NEGATIVE mg/dL
NITRITE: NEGATIVE
PROTEIN: 100 mg/dL — AB
Specific Gravity, Urine: >1.03 — ABNORMAL HIGH (ref 1.005–1.030)
pH: 5.5 (ref 5.0–8.0)

## 2017-03-14 MED ORDER — PHENAZOPYRIDINE HCL 200 MG PO TABS: 200.0000 mg | ORAL_TABLET | Freq: Three times a day (TID) | ORAL | 0 refills | Status: DC | PRN

## 2017-03-14 MED ORDER — NITROFURANTOIN MONOHYD MACRO 100 MG PO CAPS: 100.0000 mg | ORAL_CAPSULE | Freq: Two times a day (BID) | ORAL | 0 refills | Status: DC

## 2017-03-14 NOTE — ED Triage Notes (Signed)
31 year old Caucasian female is here today with complaints of polyuria that started yesterday. She states she has had a UTI before it's not chronic.

## 2017-03-14 NOTE — ED Provider Notes (Signed)
HPI  SUBJECTIVE:  Cynthia Lewis is a 31 y.o. female who presents with dysuria, urgency, frequency starting last night. She tried cranberry juice, 600 mg of Advil. Advil helps. No aggravating factors. She denies cloudy or odorous urine, hematuria. No nausea, vomiting, fevers, abdominal, back, pelvic pain. No vaginal bleeding, discharge, rash, odor. No antibiotics recently. No perfumed soaps or body washes. She is sexually active with a new female partner who is asymptomatic. They do not use condoms this weekend however STDs are not a concern today. She states this is identical to previous UTIs. She has a past medical history of UTI and remote history of chlamydia. No history of polynephritis, nephrolithiasis. No history of PID, ectopic pregnancies, gonorrhea, HIV, HSV, syphilis, Trichomonas, BV, yeast. No history of diabetes. LMP: 6/25. Denies possibility of being pregnant and states we do not need to check. PMD: Sherlene Shamsullo, Teresa L, MD    Past Medical History:  Diagnosis Date  . Anxiety   . Hypertension     History reviewed. No pertinent surgical history.  Family History  Problem Relation Age of Onset  . Anxiety disorder Father   . Diabetes Father   . Hypertension Father   . Hyperlipidemia Father   . Cancer Paternal Grandmother [id number]       breast ca  . Diabetes Paternal Grandmother   . Anxiety disorder Maternal Grandmother     Social History  Substance Use Topics  . Smoking status: Never Smoker  . Smokeless tobacco: Never Used  . Alcohol use Yes    No current facility-administered medications for this encounter.   Current Outpatient Prescriptions:  .  ALPRAZolam (XANAX) 0.25 MG tablet, Take 1 tablet (0.25 mg total) by mouth 2 (two) times daily as needed for anxiety., Disp: 30 tablet, Rfl: 3 .  citalopram (CELEXA) 20 MG tablet, TAKE 1 TABLET BY MOUTH  DAILY, Disp: 90 tablet, Rfl: 2 .  levonorgestrel (MIRENA) 20 MCG/24HR IUD, 1 each by Intrauterine route once., Disp: , Rfl:  .   nitrofurantoin, macrocrystal-monohydrate, (MACROBID) 100 MG capsule, Take 1 capsule (100 mg total) by mouth 2 (two) times daily. X 5 days, Disp: 10 capsule, Rfl: 0 .  phenazopyridine (PYRIDIUM) 200 MG tablet, Take 1 tablet (200 mg total) by mouth 3 (three) times daily as needed for pain., Disp: 6 tablet, Rfl: 0  Allergies  Allergen Reactions  . Amoxicillin Nausea And Vomiting     ROS  As noted in HPI.   Physical Exam  BP 105/79 (BP Location: Left Arm)   Pulse 74   Temp 98.6 F (37 C) (Oral)   Resp 16   Ht 5\' 6"  (1.676 m)   Wt 220 lb (99.8 kg)   SpO2 99%   BMI 35.51 kg/m   Constitutional: Well developed, well nourished, no acute distress Eyes:  EOMI, conjunctiva normal bilaterally HENT: Normocephalic, atraumatic,mucus membranes moist Respiratory: Normal inspiratory effort Cardiovascular: Normal rate GI: nondistended. Soft. Positive suprapubic tenderness. No flank tenderness. Back: No CVAT GU: Deferred skin: No rash, skin intact Musculoskeletal: no deformities Neurologic: Alert & oriented x 3, no focal neuro deficits Psychiatric: Speech and behavior appropriate   ED Course   Medications - No data to display  Orders Placed This Encounter  Procedures  . Urine culture    Standing Status:   Standing    Number of Occurrences:   1    Order Specific Question:   List patient's active antibiotics    Answer:   macrobid    Order Specific Question:  Patient immune status    Answer:   Normal  . Urinalysis, Complete w Microscopic    Standing Status:   Standing    Number of Occurrences:   1    Results for orders placed or performed during the hospital encounter of 03/14/17 (from the past 24 hour(s))  Urinalysis, Complete w Microscopic     Status: Abnormal   Collection Time: 03/14/17  8:57 AM  Result Value Ref Range   Color, Urine YELLOW YELLOW   APPearance CLOUDY (A) CLEAR   Specific Gravity, Urine >1.030 (H) 1.005 - 1.030   pH 5.5 5.0 - 8.0   Glucose, UA NEGATIVE  NEGATIVE mg/dL   Hgb urine dipstick MODERATE (A) NEGATIVE   Bilirubin Urine NEGATIVE NEGATIVE   Ketones, ur NEGATIVE NEGATIVE mg/dL   Protein, ur 161 (A) NEGATIVE mg/dL   Nitrite NEGATIVE NEGATIVE   Leukocytes, UA SMALL (A) NEGATIVE   Squamous Epithelial / LPF 0-5 (A) NONE SEEN   WBC, UA 21-50 0 - 5 WBC/hpf   RBC / HPF TOO NUMEROUS TO COUNT 0 - 5 RBC/hpf   Bacteria, UA MANY (A) NONE SEEN   No results found.  ED Clinical Impression  Acute cystitis with hematuria   ED Assessment/Plan  UA consistent with UTI. Plan to send home with Pyridium, Macrobid. Sending off urine culture to confirm antibiotic choice. She has follow-up with her primary care physician tomorrow. Ibuprofen 600 mg 1 g of Tylenol 3-4 times a day as needed for pain.  Discussed labs,  MDM, plan and followup with patient . Discussed sn/sx that should prompt return to the ED. Patient  agrees with plan.   Meds ordered this encounter  Medications  . nitrofurantoin, macrocrystal-monohydrate, (MACROBID) 100 MG capsule    Sig: Take 1 capsule (100 mg total) by mouth 2 (two) times daily. X 5 days    Dispense:  10 capsule    Refill:  0  . phenazopyridine (PYRIDIUM) 200 MG tablet    Sig: Take 1 tablet (200 mg total) by mouth 3 (three) times daily as needed for pain.    Dispense:  6 tablet    Refill:  0    *This clinic note was created using Scientist, clinical (histocompatibility and immunogenetics). Therefore, there may be occasional mistakes despite careful proofreading.  ?   Domenick Gong, MD 03/14/17 [id number]

## 2017-03-15 ENCOUNTER — Other Ambulatory Visit: Payer: Self-pay | Admitting: Internal Medicine

## 2017-03-15 ENCOUNTER — Ambulatory Visit (INDEPENDENT_AMBULATORY_CARE_PROVIDER_SITE_OTHER): Payer: 59 | Admitting: Internal Medicine

## 2017-03-15 ENCOUNTER — Encounter: Payer: Self-pay | Admitting: Internal Medicine

## 2017-03-15 VITALS — BP 114/86 | HR 76 | Temp 98.2°F | Resp 17 | Ht 66.0 in | Wt 226.2 lb

## 2017-03-15 DIAGNOSIS — Z6832 Body mass index (BMI) 32.0-32.9, adult: Secondary | ICD-10-CM | POA: Diagnosis not present

## 2017-03-15 DIAGNOSIS — E6609 Other obesity due to excess calories: Secondary | ICD-10-CM

## 2017-03-15 DIAGNOSIS — Z0000 Encounter for general adult medical examination without abnormal findings: Secondary | ICD-10-CM | POA: Diagnosis not present

## 2017-03-15 DIAGNOSIS — D508 Other iron deficiency anemias: Secondary | ICD-10-CM

## 2017-03-15 NOTE — Patient Instructions (Signed)

## 2017-03-15 NOTE — Progress Notes (Signed)
Patient ID: Cynthia Lewis, female    DOB: March 11, 1986  Age: 31 y.o. MRN: [id number]  The patient is here for annual non gyn preventive  examination   NEEDS [facility] SCREENING FROM FILLE DOUT AND SENT IN BY AUGUST 24TH   The risk factors are reflected in the social history.  The roster of all physicians providing medical care to patient - is listed in the Snapshot section of the chart.  Activities of daily living:  The patient is 100% independent in all ADLs: dressing, toileting, feeding as well as independent mobility  Home safety : The patient has smoke detectors in the home. They wear seatbelts.  There are no firearms at home. There is no violence in the home.   There is no risks for hepatitis, STDs or HIV. There is no   history of blood transfusion. They have no travel history to infectious disease endemic areas of the world.  The patient has seen their dentist in the last six month. They have seen their eye doctor in the last year.    Discussed the need for sun protection: hats, long sleeves and use of sunscreen if there is significant sun exposure.   Diet: the importance of a healthy diet is discussed. They do not have a healthy diet.  The benefits of regular aerobic exercise were discussed. She is not exercising.  Working  2 jobs, [employer] full time,.  [employer] on the weekends (20 to 25 hrs )    Depression screen: there are no signs or vegative symptoms of depression- irritability, change in appetite, anhedonia, sadness/tearfullness.  Cognitive assessment: the patient manages all their financial and personal affairs and is actively engaged. They could relate day,date,year and events; recalled 2/3 objects at 3 minutes; performed clock-face test normally.  The following portions of the patient's history were reviewed and updated as appropriate: allergies, current medications, past family history, past medical history,  past surgical history, past social history  and problem  list.  Visual acuity was not assessed per patient preference since she has regular follow up with her ophthalmologist. Hearing and body mass index were assessed and reviewed.   During the course of the visit the patient was educated and counseled about appropriate screening and preventive services including : fall prevention , diabetes screening, nutrition counseling, colorectal cancer screening, and recommended immunizations.    CC: The primary encounter diagnosis was Encounter for preventive health examination. Diagnoses of Routine general medical examination at a health care facility, Class 1 obesity due to excess calories without serious comorbidity with body mass index (BMI) of 32.0 to 32.9 in adult, and Other iron deficiency anemia were also pertinent to this visit.  Weight gain of 43 lbs since March 2016 Less anxiety panic attacks  Since she  broke up with boyfriend.  Taking macrobid for UTI  Started yesterday after swimming in the ocean and pool.    History Cataleya has a past medical history of Anxiety and Hypertension.   She has no past surgical history on file.   Her family history includes Anxiety disorder in her father and maternal grandmother; Cancer (age of onset: 53) in her paternal grandmother; Diabetes in her father and paternal grandmother; Hyperlipidemia in her father; Hypertension in her father.She reports that she has never smoked. She has never used smokeless tobacco. She reports that she drinks alcohol. She reports that she does not use drugs.  Outpatient Medications Prior to Visit  Medication Sig Dispense Refill  . ALPRAZolam (XANAX) 0.25 MG  tablet Take 1 tablet (0.25 mg total) by mouth 2 (two) times daily as needed for anxiety. 30 tablet 3  . citalopram (CELEXA) 20 MG tablet TAKE 1 TABLET BY MOUTH  DAILY 90 tablet 2  . levonorgestrel (MIRENA) 20 MCG/24HR IUD 1 each by Intrauterine route once.    . nitrofurantoin, macrocrystal-monohydrate, (MACROBID) 100 MG capsule  Take 1 capsule (100 mg total) by mouth 2 (two) times daily. X 5 days 10 capsule 0  . phenazopyridine (PYRIDIUM) 200 MG tablet Take 1 tablet (200 mg total) by mouth 3 (three) times daily as needed for pain. 6 tablet 0   No facility-administered medications prior to visit.     Review of Systems  Objective:  BP 114/86 (BP Location: Left Arm, Patient Position: Sitting, Cuff Size: Normal)   Pulse 76   Temp 98.2 F (36.8 C) (Oral)   Resp 17   Ht 5\' 6"  (1.676 m)   Wt 226 lb 3.2 oz (102.6 kg)   SpO2 96%   BMI 36.51 kg/m   Physical Exam    Assessment & Plan:   Problem List Items Addressed This Visit    Anemia, iron deficiency    Repeat iron studies are pending, patient has a Mirena IUD as does not menstruate, but has symptoms of IDA.       Obesity    I have addressed  BMI and recommended wt loss of 10% of body weight over the next 6 months using a low fat, low starch, high protein  fruit/vegetable based Mediterranean diet and 30 minutes of aerobic exercise a minimum of 5 days per week.        Routine general medical examination at a health care facility    Annual comprehensive preventive exam was done as well as an evaluation and management of chronic conditions .  During the course of the visit the patient was educated and counseled about appropriate screening and preventive services including :  diabetes screening, lipid analysis with projected  10 year  risk for CAD , nutrition counseling, breast, cervical and colorectal cancer screening, and recommended immunizations.  Printed recommendations for health maintenance screenings was given       Other Visit Diagnoses    Encounter for preventive health examination    -  Primary   Relevant Orders   Hemoglobin A1c   Lipid panel   Comprehensive metabolic panel      I am having Ms. Dobis maintain her levonorgestrel, ALPRAZolam, citalopram, nitrofurantoin (macrocrystal-monohydrate), and phenazopyridine.  No orders of the  defined types were placed in this encounter.   There are no discontinued medications.  Follow-up: No Follow-up on file.   Sherlene ShamsULLO, TERESA L, MD

## 2017-03-16 NOTE — Assessment & Plan Note (Signed)
I have addressed  BMI and recommended wt loss of 10% of body weight over the next 6 months using a low fat, low starch, high protein  fruit/vegetable based Mediterranean diet and 30 minutes of aerobic exercise a minimum of 5 days per week.   

## 2017-03-16 NOTE — Assessment & Plan Note (Signed)
Annual comprehensive preventive exam was done as well as an evaluation and management of chronic conditions .  During the course of the visit the patient was educated and counseled about appropriate screening and preventive services including :  diabetes screening, lipid analysis with projected  10 year  risk for CAD , nutrition counseling, breast, cervical and colorectal cancer screening, and recommended immunizations.  Printed recommendations for health maintenance screenings was given 

## 2017-03-16 NOTE — Assessment & Plan Note (Signed)
Repeat iron studies are pending, patient has a Mirena IUD as does not menstruate, but has symptoms of IDA.

## 2017-03-17 LAB — URINE CULTURE
Culture: >=100000 — AB
Special Requests: NORMAL

## 2017-03-22 ENCOUNTER — Telehealth: Payer: Self-pay | Admitting: Internal Medicine

## 2017-03-22 NOTE — Telephone Encounter (Signed)
Pt called requesting lab results. Please advise, thank you!  Call pt @ [phone]

## 2017-03-22 NOTE — Telephone Encounter (Signed)
Cynthia Lewis in the lab is calling [facility] to see way we haven't gotten her results back yet.

## 2017-03-22 NOTE — Telephone Encounter (Signed)
[place] stated that labs are being faxed over.

## 2017-03-23 ENCOUNTER — Encounter: Payer: Self-pay | Admitting: Internal Medicine

## 2017-03-24 ENCOUNTER — Encounter: Payer: Self-pay | Admitting: Internal Medicine

## 2017-03-24 LAB — COMPREHENSIVE METABOLIC PANEL

## 2017-03-24 LAB — HEMOGLOBIN A1C
Est. average glucose Bld gHb Est-mCnc: 105 mg/dL
Hgb A1c MFr Bld: 5.3 % (ref 4.8–5.6)

## 2017-03-24 LAB — LIPID PANEL

## 2017-03-26 ENCOUNTER — Other Ambulatory Visit: Payer: Self-pay | Admitting: Internal Medicine

## 2017-03-27 LAB — COMPREHENSIVE METABOLIC PANEL
A/G RATIO: 1.7 (ref 1.2–2.2)
ALBUMIN: 4.2 g/dL (ref 3.5–5.5)
ALT: 9 IU/L (ref 0–32)
AST: 14 IU/L (ref 0–40)
Alkaline Phosphatase: 70 IU/L (ref 39–117)
BILIRUBIN TOTAL: 0.7 mg/dL (ref 0.0–1.2)
BUN / CREAT RATIO: 12 (ref 9–23)
BUN: 11 mg/dL (ref 6–20)
CO2: 22 mmol/L (ref 20–29)
Calcium: 9.1 mg/dL (ref 8.7–10.2)
Chloride: 105 mmol/L (ref 96–106)
Creatinine, Ser: 0.93 mg/dL (ref 0.57–1.00)
GFR calc non Af Amer: 83 mL/min/{1.73_m2} (ref >59)
GFR, EST AFRICAN AMERICAN: 95 mL/min/{1.73_m2} (ref >59)
GLUCOSE: 78 mg/dL (ref 65–99)
Globulin, Total: 2.5 g/dL (ref 1.5–4.5)
Potassium: 4.3 mmol/L (ref 3.5–5.2)
SODIUM: 142 mmol/L (ref 134–144)
TOTAL PROTEIN: 6.7 g/dL (ref 6.0–8.5)

## 2017-03-27 LAB — HEMOGLOBIN A1C
ESTIMATED AVERAGE GLUCOSE: 103 mg/dL
Hgb A1c MFr Bld: 5.2 % (ref 4.8–5.6)

## 2017-03-27 LAB — LIPID PANEL
CHOL/HDL RATIO: 3.2 ratio (ref 0.0–4.4)
Cholesterol, Total: 168 mg/dL (ref 100–199)
HDL: 52 mg/dL (ref >39)
LDL CALC: 90 mg/dL (ref 0–99)
Triglycerides: 129 mg/dL (ref 0–149)
VLDL Cholesterol Cal: 26 mg/dL (ref 5–40)

## 2017-03-30 ENCOUNTER — Encounter: Payer: Self-pay | Admitting: Internal Medicine

## 2017-04-16 ENCOUNTER — Telehealth: Payer: Self-pay | Admitting: Internal Medicine

## 2017-04-16 NOTE — Telephone Encounter (Signed)
Pt dropped off e health screening form to be filled out. It is Dr Darrick Huntsman folder up front. Thank you!

## 2017-04-16 NOTE — Telephone Encounter (Signed)
Placed in red folder  

## 2017-04-16 NOTE — Telephone Encounter (Signed)
Returned to you 

## 2017-04-19 DIAGNOSIS — Z0279 Encounter for issue of other medical certificate: Secondary | ICD-10-CM

## 2017-04-19 NOTE — Telephone Encounter (Signed)
Spoke with pt and informed her that the paperwork has been completed placed up front for pick up. Pt stated that she would be by sometime tomorrow to pick it up.

## 2017-04-19 NOTE — Telephone Encounter (Signed)
LMTCB

## 2017-05-19 NOTE — Telephone Encounter (Signed)
Error

## 2017-05-19 NOTE — Telephone Encounter (Signed)
error 

## 2017-06-05 ENCOUNTER — Other Ambulatory Visit: Payer: Self-pay | Admitting: Internal Medicine

## 2017-09-24 ENCOUNTER — Ambulatory Visit (INDEPENDENT_AMBULATORY_CARE_PROVIDER_SITE_OTHER): Payer: BLUE CROSS/BLUE SHIELD | Admitting: Podiatry

## 2017-09-24 ENCOUNTER — Other Ambulatory Visit: Payer: Self-pay | Admitting: Podiatry

## 2017-09-24 ENCOUNTER — Encounter: Payer: Self-pay | Admitting: Podiatry

## 2017-09-24 ENCOUNTER — Ambulatory Visit (INDEPENDENT_AMBULATORY_CARE_PROVIDER_SITE_OTHER): Payer: BLUE CROSS/BLUE SHIELD

## 2017-09-24 DIAGNOSIS — M722 Plantar fascial fibromatosis: Secondary | ICD-10-CM

## 2017-09-24 DIAGNOSIS — R52 Pain, unspecified: Secondary | ICD-10-CM

## 2017-09-24 NOTE — Progress Notes (Signed)
   Subjective:    Patient ID: Cynthia Lewis, female    DOB: 09/21/1985, 32 y.o.   MRN: [id number]  HPI    Review of Systems  Neurological: Positive for headaches.  All other systems reviewed and are negative.      Objective:   Physical Exam        Assessment & Plan:

## 2017-09-25 ENCOUNTER — Encounter: Payer: Self-pay | Admitting: Podiatry

## 2017-09-27 ENCOUNTER — Telehealth: Payer: Self-pay | Admitting: Podiatry

## 2017-09-27 MED ORDER — MELOXICAM 15 MG PO TABS: 15.0000 mg | ORAL_TABLET | Freq: Every day | ORAL | 3 refills | Status: DC

## 2017-09-27 NOTE — Telephone Encounter (Signed)
I returned patient call and informed her that the script for Meloxicam has been sent to her pharmacy/

## 2017-09-27 NOTE — Telephone Encounter (Signed)
I was calling because I thought I had a prescription for meloxicam/pain med's just because I'm on my feet all day at work. I called this morning and was told the medication was with the nurse. I never received a call or anything letting me know the prescription is ready. I was calling to see if it is ready or if there was any issues. You can always call me at [phone]. Thanks and have a great day.

## 2017-09-27 NOTE — Progress Notes (Signed)
   Subjective: Patient presents today for pain and tenderness in the plantar aspect of bilateral heels that began 3 months ago. She states the left foot is worse than the right. Patient states that it hurts in the morning with the first steps out of bed. She states she stands on a concrete floor all day at work. She has not done anything to treat the symptoms. Patient presents today for further treatment and evaluation.   Past Medical History:  Diagnosis Date  . Anxiety   . Hypertension      Objective: Physical Exam General: The patient is alert and oriented x3 in no acute distress.  Dermatology: Skin is warm, dry and supple bilateral lower extremities. Negative for open lesions or macerations bilateral.   Vascular: Dorsalis Pedis and Posterior Tibial pulses palpable bilateral.  Capillary fill time is immediate to all digits.  Neurological: Epicritic and protective threshold intact bilateral.   Musculoskeletal: Tenderness to palpation at the medial calcaneal tubercale and through the insertion of the plantar fascia of the bilateral feet. All other joints range of motion within normal limits bilateral. Strength 5/5 in all groups bilateral.   Radiographic exam: Normal osseous mineralization. Joint spaces preserved. No fracture/dislocation/boney destruction. Calcaneal spur present with mild thickening of plantar fascia bilateral. No other soft tissue abnormalities or radiopaque foreign bodies.   Assessment: 1. plantar fasciitis bilateral feet  Plan of Care:  1. Patient evaluated. Xrays reviewed.   2. Injection of 0.5cc Celestone soluspan injected into the bilateral heels.  3. Rx for Meloxicam provided to patient.  4. Recommended good shoe gear. 5. Plantar fascial band(s) dispensed for bilateral plantar fasciitis. 6. Instructed patient regarding therapies and modalities at home to alleviate symptoms.  7. Return to clinic in 4 weeks.    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   Felecia ShellingBrent M. Parish Augustine, DPM [facility]  Dr. Felecia ShellingBrent M. Lauralyn Shadowens, DPM    [address]                                   [place], [place] [address]                Office [phone]) [phone]  Fax [phone]) [phone]

## 2017-10-22 ENCOUNTER — Encounter: Payer: Self-pay | Admitting: Podiatry

## 2017-10-22 ENCOUNTER — Ambulatory Visit: Payer: BLUE CROSS/BLUE SHIELD | Admitting: Podiatry

## 2017-10-22 DIAGNOSIS — M722 Plantar fascial fibromatosis: Secondary | ICD-10-CM

## 2017-10-22 MED ORDER — DICLOFENAC SODIUM 75 MG PO TBEC: 75.0000 mg | DELAYED_RELEASE_TABLET | Freq: Two times a day (BID) | ORAL | 1 refills | Status: DC

## 2017-10-25 NOTE — Progress Notes (Signed)
   Subjective: Patient presents today for follow up evaluation of bilateral plantar fasciitis. She states her pain has improved but is not completely resolved. She states she still walks on cement floors all day but reports the pain is not as significant in the morning time as it was previously. She has been taking Mobic with no significant relief. Patient presents today for further treatment and evaluation.   Past Medical History:  Diagnosis Date  . Anxiety   . Hypertension      Objective: Physical Exam General: The patient is alert and oriented x3 in no acute distress.  Dermatology: Skin is warm, dry and supple bilateral lower extremities. Negative for open lesions or macerations bilateral.   Vascular: Dorsalis Pedis and Posterior Tibial pulses palpable bilateral.  Capillary fill time is immediate to all digits.  Neurological: Epicritic and protective threshold intact bilateral.   Musculoskeletal: Tenderness to palpation at the medial calcaneal tubercale and through the insertion of the plantar fascia of the bilateral feet. All other joints range of motion within normal limits bilateral. Strength 5/5 in all groups bilateral.   Assessment: 1. plantar fasciitis bilateral feet - improved  Plan of Care:  1. Patient evaluated.  2. Injection of 0.5cc Celestone soluspan injected into the bilateral heels.  3. Rx for Diclofenac provided to patient. Discontinue taking Mobic. 4. Appointment with [facility]ick for custom molded orthotics.  5. Continue wearing plantar fascial braces.  6. Return to clinic in 4 weeks.    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   Felecia ShellingBrent M. Evans, DPM [facility]  Dr. Felecia ShellingBrent M. Evans, DPM    [address]                                   [place], [place] [address]                Office [phone](336) [phone]  Fax [phone](336) [phone]

## 2017-11-03 ENCOUNTER — Ambulatory Visit (INDEPENDENT_AMBULATORY_CARE_PROVIDER_SITE_OTHER): Payer: BLUE CROSS/BLUE SHIELD | Admitting: Orthotics

## 2017-11-03 DIAGNOSIS — M722 Plantar fascial fibromatosis: Secondary | ICD-10-CM

## 2017-11-12 IMAGING — DX DG CHEST 2V
2 series · 2 of 2 positions shown · non-contrast
Comparison: None.

CLINICAL DATA: Fever and chills; cough

EXAM:
CHEST  2 VIEW

[chest pa]
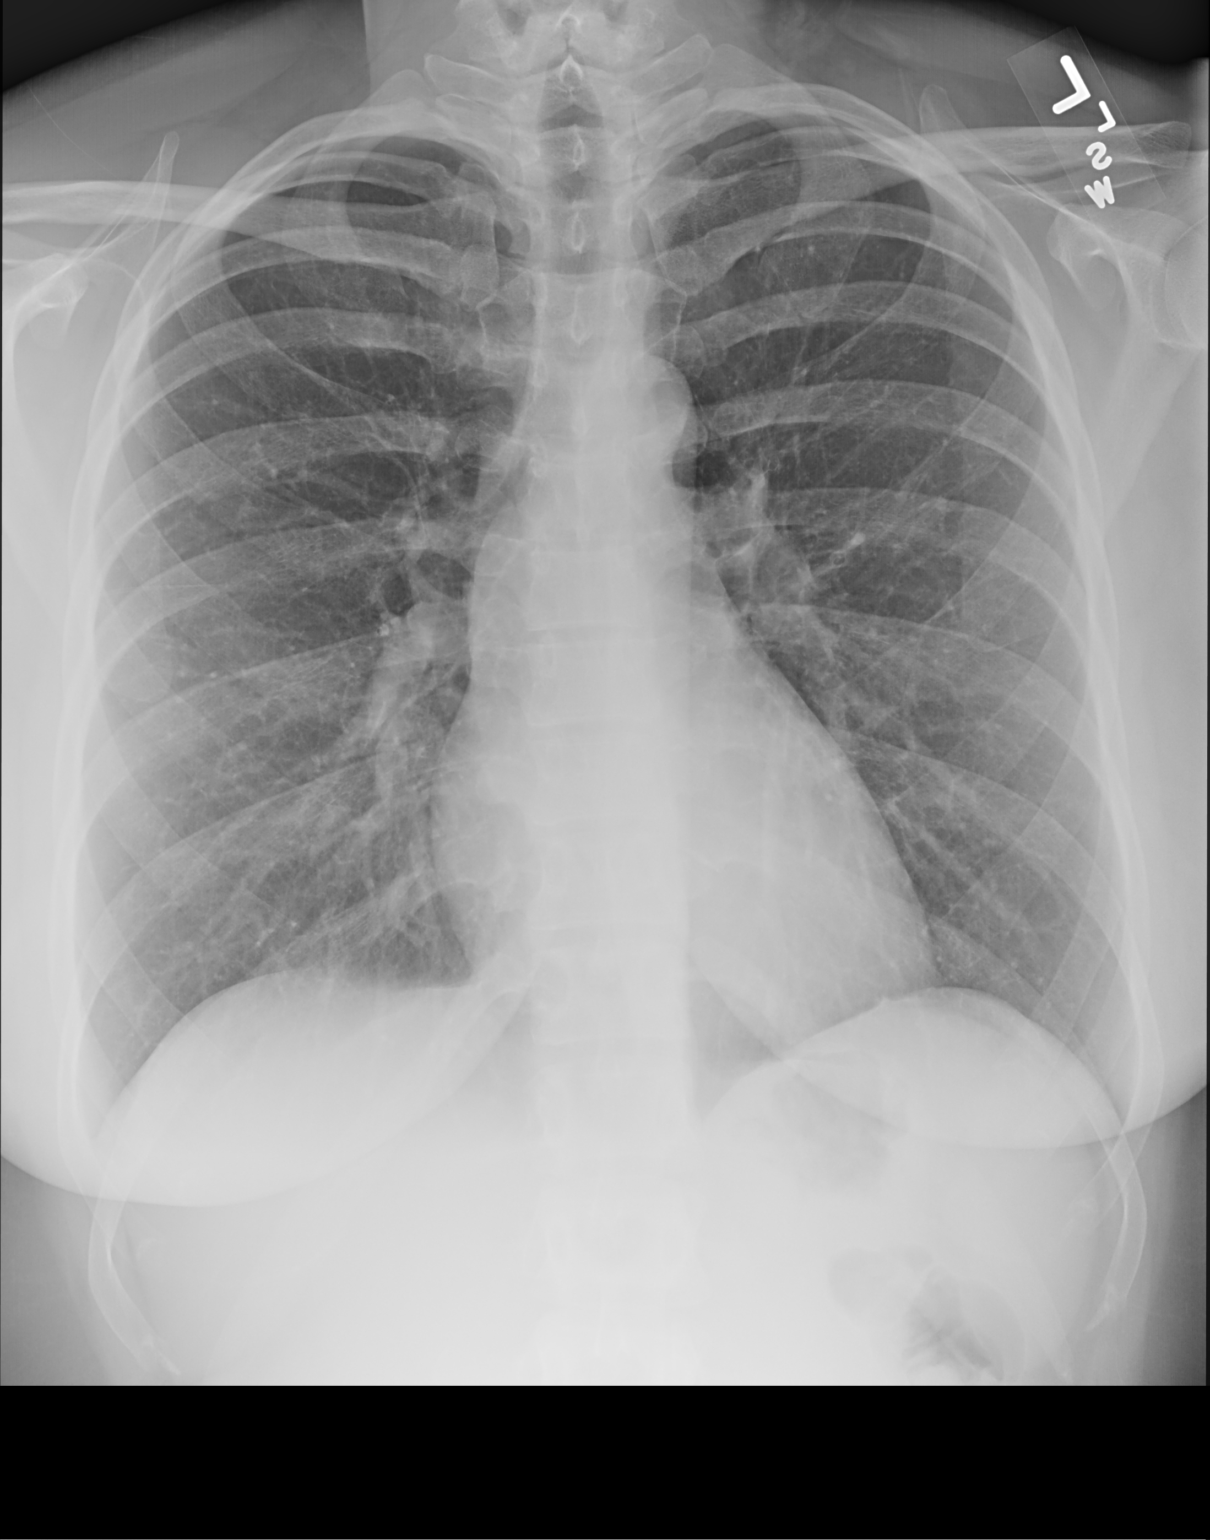

[chest lat]
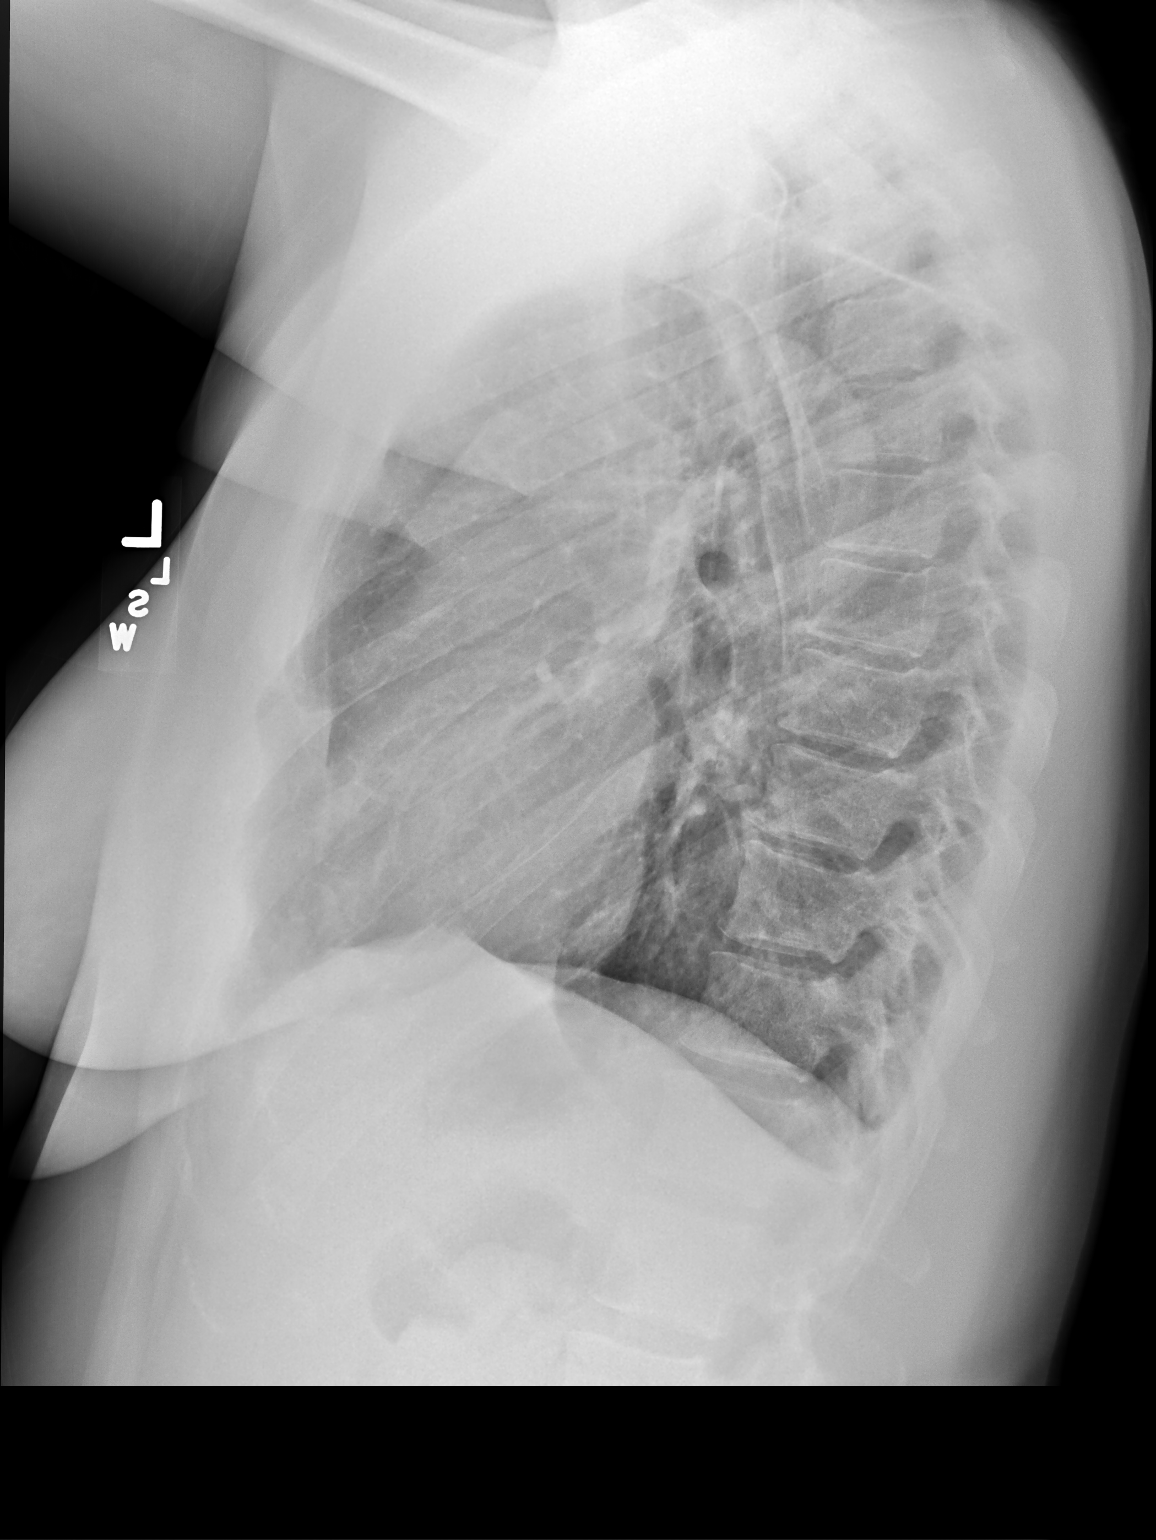

[2 of 2 positions shown; findings below may reference images not displayed]

FINDINGS: Tiny calcified granulomas are noted throughout the lungs. There is
no edema or consolidation. The heart size pulmonary vascularity are
normal. No adenopathy. No bone lesions.
IMPRESSION: Tiny calcified granulomas throughout the lungs. No edema or
consolidation.

## 2017-11-16 DIAGNOSIS — T8339XD Other mechanical complication of intrauterine contraceptive device, subsequent encounter: Secondary | ICD-10-CM | POA: Diagnosis not present

## 2017-11-16 DIAGNOSIS — Z30431 Encounter for routine checking of intrauterine contraceptive device: Secondary | ICD-10-CM | POA: Diagnosis not present

## 2017-11-16 DIAGNOSIS — Z30430 Encounter for insertion of intrauterine contraceptive device: Secondary | ICD-10-CM | POA: Diagnosis not present

## 2017-11-24 ENCOUNTER — Ambulatory Visit: Payer: BLUE CROSS/BLUE SHIELD | Admitting: Orthotics

## 2017-11-24 DIAGNOSIS — M722 Plantar fascial fibromatosis: Secondary | ICD-10-CM

## 2017-11-24 NOTE — Progress Notes (Signed)
Patient came in today to pick up custom made foot orthotics.  The goals were accomplished and the patient reported no dissatisfaction with said orthotics.  Patient was advised of breakin period and how to report any issues. 

## 2018-01-03 ENCOUNTER — Telehealth: Payer: Self-pay

## 2018-01-03 ENCOUNTER — Encounter: Payer: Self-pay | Admitting: Internal Medicine

## 2018-01-03 NOTE — Telephone Encounter (Signed)
Is it ok to use one of your 4 or 4:30pm appts for this?

## 2018-01-03 NOTE — Telephone Encounter (Signed)
Pt dropped off [employer] forms to be filled out.. Placed in Dr. Ceasar Lund color folder upfront.Marland Kitchen

## 2018-01-03 NOTE — Telephone Encounter (Signed)
yes

## 2018-01-03 NOTE — Telephone Encounter (Signed)
Forms have been placed red folder.

## 2018-01-03 NOTE — Telephone Encounter (Signed)
Copied from CRM [id number]. Topic: Appointment Scheduling - Scheduling Inquiry for Clinic >> Jan 03, 2018  3:22 PM Cynthia Lewis wrote: Reason for CRM: Patient dropped off forms for reasonable accomodation today which needs to be completed by PCP and faxed to the patient's job, however, she needs an appointment to do so and there is nothing available with [facility] before 05/31 which is when the forms are due for work. Please call patient back to see about a work in or an alternate solution.

## 2018-01-04 NOTE — Telephone Encounter (Signed)
Called the office, and also read that ok to schedule pt prior to 5/31.  Pt has been scheduled for 5/22 at 4 pm.

## 2018-01-04 NOTE — Telephone Encounter (Signed)
LMTCB. Please transfer pt to our office.  

## 2018-01-12 ENCOUNTER — Ambulatory Visit (INDEPENDENT_AMBULATORY_CARE_PROVIDER_SITE_OTHER): Payer: BLUE CROSS/BLUE SHIELD | Admitting: Internal Medicine

## 2018-01-12 ENCOUNTER — Encounter: Payer: Self-pay | Admitting: Internal Medicine

## 2018-01-12 DIAGNOSIS — M25561 Pain in right knee: Secondary | ICD-10-CM

## 2018-01-12 DIAGNOSIS — G8929 Other chronic pain: Secondary | ICD-10-CM

## 2018-01-12 DIAGNOSIS — E6609 Other obesity due to excess calories: Secondary | ICD-10-CM | POA: Diagnosis not present

## 2018-01-12 DIAGNOSIS — M25562 Pain in left knee: Secondary | ICD-10-CM | POA: Diagnosis not present

## 2018-01-12 DIAGNOSIS — Z6832 Body mass index (BMI) 32.0-32.9, adult: Secondary | ICD-10-CM | POA: Diagnosis not present

## 2018-01-12 MED ORDER — PHENTERMINE HCL 37.5 MG PO TABS: 37.5000 mg | ORAL_TABLET | Freq: Every day | ORAL | 2 refills | Status: DC

## 2018-01-12 NOTE — Progress Notes (Signed)
Subjective:  Patient ID: Cynthia Lewis, female    DOB: 30-Jul-1986  Age: 32 y.o. MRN: [id number]  CC: Diagnoses of Bilateral chronic knee pain and Class 1 obesity due to excess calories without serious comorbidity with body mass index (BMI) of 32.0 to 32.9 in adult were pertinent to this visit.  HPI Cynthia Lewis presents for request for [employer].   1) evaluation of need for [employer] (standing desk) in .  Patient  has no history of DDD or spinal stenosis but notes that after spending 8 hours  Per day sitting   In front of a computer at work, she has bilateral knee pain and  low back pain caused by her seating position .     She is only allowed one 15 minute break per 4 hour  Period.  She has tried a Youth worker and feels that the ability to change position was less stressful on her knees and lower back   2) Obesity:  She has gained 19 lbs sincer last visit July 2018. Started cross fit,  Has a coach and a team . .  Doing it 2 days per week. No joint pain aterward, just muscle pain.   Trying to follow a low carb diet,  But currently having trouble controlling appettite.  Had great success with phentemrine several years ago,  Discussed a trial of medication for 3 months     Outpatient Medications Prior to Visit  Medication Sig Dispense Refill  . citalopram (CELEXA) 20 MG tablet TAKE 1 TABLET BY MOUTH  DAILY 90 tablet 1  . levonorgestrel (MIRENA) 20 MCG/24HR IUD 1 each by Intrauterine route once.    . diclofenac (VOLTAREN) 75 MG EC tablet Take 1 tablet (75 mg total) by mouth 2 (two) times daily. (Patient not taking: Reported on 01/12/2018) 60 tablet 1  . meloxicam (MOBIC) 15 MG tablet Take 1 tablet (15 mg total) by mouth daily. (Patient not taking: Reported on 01/12/2018) 30 tablet 3   No facility-administered medications prior to visit.     Review of Systems;  Patient denies headache, fevers, malaise, unintentional weight loss, skin rash, eye pain, sinus congestion and sinus pain, sore  throat, dysphagia,  hemoptysis , cough, dyspnea, wheezing, chest pain, palpitations, orthopnea, edema, abdominal pain, nausea, melena, diarrhea, constipation, flank pain, dysuria, hematuria, urinary  Frequency, nocturia, numbness, tingling, seizures,  Focal weakness, Loss of consciousness,  Tremor, insomnia, depression, anxiety, and suicidal ideation.      Objective:  BP 130/86 (BP Location: Left Arm, Patient Position: Sitting, Cuff Size: Large)   Pulse 74   Temp 99 F (37.2 C) (Oral)   Resp 15   Ht  (1.676 m)   Wt 245 lb 9.6 oz (111.4 kg)   SpO2 98%   BMI 39.64 kg/m   BP Readings from Last 3 Encounters:  01/12/18 130/86  03/15/17 114/86  03/14/17 105/79    Wt Readings from Last 3 Encounters:  01/12/18 245 lb 9.6 oz (111.4 kg)  03/15/17 226 lb 3.2 oz (102.6 kg)  03/14/17 220 lb (99.8 kg)    General appearance: alert, cooperative and appears stated age Ears: normal TM's and external ear canals both ears Throat: lips, mucosa, and tongue normal; teeth and gums normal Neck: no adenopathy, no carotid bruit, supple, symmetrical, trachea midline and thyroid not enlarged, symmetric, no tenderness/mass/nodules Back: symmetric, no curvature. ROM normal. No CVA tenderness. Lungs: clear to auscultation bilaterally Heart: regular rate and rhythm, S1, S2 normal, no murmur,  click, rub or gallop Abdomen: soft, non-tender; bowel sounds normal; no masses,  no organomegaly Pulses: 2+ and symmetric Skin: Skin color, texture, turgor normal. No rashes or lesions Lymph nodes: Cervical, supraclavicular, and axillary nodes normal.  Lab Results  Component Value Date   HGBA1C 5.2 03/26/2017   HGBA1C 5.3 03/15/2017    Lab Results  Component Value Date   CREATININE 0.93 03/26/2017   CREATININE CANCELED 03/15/2017   CREATININE 1.0 10/03/2014    Lab Results  Component Value Date   WBC 6.7 11/14/2013   HGB 15.1 11/14/2013   HCT 45.3 11/14/2013   PLT 231 11/14/2013   GLUCOSE 78  03/26/2017   CHOL 168 03/26/2017   TRIG 129 03/26/2017   HDL 52 03/26/2017   LDLCALC 90 03/26/2017   ALT 9 03/26/2017   AST 14 03/26/2017   NA 142 03/26/2017   K 4.3 03/26/2017   CL 105 03/26/2017   CREATININE 0.93 03/26/2017   BUN 11 03/26/2017   CO2 22 03/26/2017   TSH 2.07 10/03/2014   HGBA1C 5.2 03/26/2017    No results found.  Assessment & Plan:   Problem List Items Addressed This Visit    Obesity    She has had difficulty losing weight due to increased appetite and is requesting a trial of  Phentermine.  She is aware of the possible side effects and risks and understands that    The medication will be discontinued if she has not lost 5% of her body weight over the next 3 months, which , based on today's weight is 12 lbs.      Relevant Medications   phentermine (ADIPEX-P) 37.5 MG tablet   Bilateral chronic knee pain    Recurrent   Chronic brought on by prolonged sitting for > 8 hours daily .agree that the use of a Veridesk would be healthier for knees and low back        A total of 25 minutes of face to face time was spent with patient more than half of which was spent in counselling about the above mentioned conditions  and coordination of care   I have discontinued Shenika E. Rullo's meloxicam and diclofenac. I am also having her start on phentermine. Additionally, I am having her maintain her levonorgestrel and citalopram.  Meds ordered this encounter  Medications  . phentermine (ADIPEX-P) 37.5 MG tablet    Sig: Take 1 tablet (37.5 mg total) by mouth daily before breakfast.    Dispense:  30 tablet    Refill:  2    Medications Discontinued During This Encounter  Medication Reason  . diclofenac (VOLTAREN) 75 MG EC tablet Patient has not taken in last 30 days  . meloxicam (MOBIC) 15 MG tablet Patient has not taken in last 30 days    Follow-up: No follow-ups on file.   Sherlene Shams, MD

## 2018-01-12 NOTE — Patient Instructions (Signed)
I will do my best to complete this form for your Veri Desk without perjuring myself  I have authorized the use of phentermine for 3 months.  Please have your vital signs checked a week after starting, and return to see me in 3 months  Take 1/2 tablet in the morning,  The second half by 3 PM to avoid insomnia.  Or take the whole tablet in the morning,  Goal weight loss is  5% of your starting weight by the end of the  3 months   For you ,  Based on today's reading, 11   lbs

## 2018-01-15 DIAGNOSIS — G8929 Other chronic pain: Secondary | ICD-10-CM | POA: Insufficient documentation

## 2018-01-15 DIAGNOSIS — M25562 Pain in left knee: Principal | ICD-10-CM

## 2018-01-15 DIAGNOSIS — M25561 Pain in right knee: Principal | ICD-10-CM

## 2018-01-15 NOTE — Assessment & Plan Note (Signed)
She has had difficulty losing weight due to increased appetite and is requesting a trial of  Phentermine.  She is aware of the possible side effects and risks and understands that    The medication will be discontinued if she has not lost 5% of her body weight over the next 3 months, which , based on today's weight is 12 lbs. 

## 2018-01-15 NOTE — Assessment & Plan Note (Signed)
Recurrent   Chronic brought on by prolonged sitting for > 8 hours daily .agree that the use of a Veridesk would be healthier for knees and low back

## 2018-02-15 NOTE — Progress Notes (Signed)

## 2018-03-16 ENCOUNTER — Encounter: Payer: 59 | Admitting: Internal Medicine

## 2018-03-18 ENCOUNTER — Ambulatory Visit (INDEPENDENT_AMBULATORY_CARE_PROVIDER_SITE_OTHER): Payer: BLUE CROSS/BLUE SHIELD | Admitting: Internal Medicine

## 2018-03-18 ENCOUNTER — Encounter: Payer: Self-pay | Admitting: Internal Medicine

## 2018-03-18 VITALS — BP 104/78 | HR 74 | Temp 99.0°F | Resp 15 | Ht 66.0 in | Wt 238.0 lb

## 2018-03-18 DIAGNOSIS — Z0000 Encounter for general adult medical examination without abnormal findings: Secondary | ICD-10-CM

## 2018-03-18 DIAGNOSIS — D508 Other iron deficiency anemias: Secondary | ICD-10-CM

## 2018-03-18 DIAGNOSIS — Z6832 Body mass index (BMI) 32.0-32.9, adult: Secondary | ICD-10-CM

## 2018-03-18 DIAGNOSIS — E6609 Other obesity due to excess calories: Secondary | ICD-10-CM

## 2018-03-18 MED ORDER — ALPRAZOLAM 0.25 MG PO TABS: 0.2500 mg | ORAL_TABLET | Freq: Two times a day (BID) | ORAL | 1 refills | Status: DC | PRN

## 2018-03-18 NOTE — Progress Notes (Signed)
Patient ID: DARI CARPENITO, female    DOB: 1985/09/25  Age: 32 y.o. MRN: [id number]  The patient is here for annual preventive  examination and management of other chronic and acute problems.  Last PAP smear done by [facility] Nov 2018 Mirena IUD replaced March 2019  Using celexa  20 mg daily,  Has  occasional intense anxiety , episodes of feeling like she is a failure,NEEDS HEALTH SCREENING FORM FAXED   The risk factors are reflected in the social history.  The roster of all physicians providing medical care to patient - is listed in the Snapshot section of the chart.  Activities of daily living:  The patient is 100% independent in all ADLs: dressing, toileting, feeding as well as independent mobility  Home safety : The patient has smoke detectors in the home. They wear seatbelts.  There are no firearms at home. There is no violence in the home.   There is no risks for hepatitis, STDs or HIV. There is no   history of blood transfusion. They have no travel history to infectious disease endemic areas of the world.  The patient has seen their dentist in the last six month. They have seen their eye doctor in the last year. They admit to slight hearing difficulty with regard to whispered voices and some television programs.  They have deferred audiologic testing in the last year.  They do not  have excessive sun exposure. Discussed the need for sun protection: hats, long sleeves and use of sunscreen if there is significant sun exposure.   Diet: the importance of a healthy diet is discussed. They do have a healthy diet.  The benefits of regular aerobic exercise were discussed. She walks 4 times per week ,  20 minutes.   Depression screen: there are no signs or vegative symptoms of depression- irritability, change in appetite, anhedonia, sadness/tearfullness.  Cognitive assessment: the patient manages all their financial and personal affairs and is actively engaged. They could relate  day,date,year and events; recalled 2/3 objects at 3 minutes; performed clock-face test normally.  The following portions of the patient's history were reviewed and updated as appropriate: allergies, current medications, past family history, past medical history,  past surgical history, past social history  and problem list.  Visual acuity was not assessed per patient preference since she has regular follow up with her ophthalmologist. Hearing and body mass index were assessed and reviewed.   During the course of the visit the patient was educated and counseled about appropriate screening and preventive services including : fall prevention , diabetes screening, nutrition counseling, colorectal cancer screening, and recommended immunizations.    CC: The primary encounter diagnosis was Other iron deficiency anemia. Diagnoses of Routine general medical examination at a health care facility and Class 1 obesity due to excess calories without serious comorbidity with body mass index (BMI) of 32.0 to 32.9 in adult were also pertinent to this visit.  1) obesity:  Started phentermine in May  7 lb weight loss thus far but just returned [place]  History Nashalie has a past medical history of Anxiety and Hypertension.   She has no past surgical history on file.   Her family history includes Anxiety disorder in her father and maternal grandmother; Cancer (age of onset: 79) in her paternal grandmother; Diabetes in her father and paternal grandmother; Hyperlipidemia in her father; Hypertension in her father; Ovarian cancer in her mother.She reports that she has never smoked. She has never used  smokeless tobacco. She reports that she drinks alcohol. She reports that she does not use drugs.  Outpatient Medications Prior to Visit  Medication Sig Dispense Refill  . citalopram (CELEXA) 20 MG tablet TAKE 1 TABLET BY MOUTH  DAILY 90 tablet 1  . levonorgestrel (MIRENA) 20 MCG/24HR IUD 1 each by Intrauterine  route once.    . phentermine (ADIPEX-P) 37.5 MG tablet Take 1 tablet (37.5 mg total) by mouth daily before breakfast. 30 tablet 2   No facility-administered medications prior to visit.     Review of Systems   Patient denies headache, fevers, malaise, unintentional weight loss, skin rash, eye pain, sinus congestion and sinus pain, sore throat, dysphagia,  hemoptysis , cough, dyspnea, wheezing, chest pain, palpitations, orthopnea, edema, abdominal pain, nausea, melena, diarrhea, constipation, flank pain, dysuria, hematuria, urinary  Frequency, nocturia, numbness, tingling, seizures,  Focal weakness, Loss of consciousness,  Tremor, insomnia, depression, anxiety, and suicidal ideation.      Objective:  BP 104/78 (BP Location: Left Arm, Patient Position: Sitting, Cuff Size: Large)   Pulse 74   Temp 99 F (37.2 C) (Oral)   Resp 15   Ht 5\' 6"  (1.676 m)   Wt 238 lb (108 kg)   SpO2 99%   BMI 38.41 kg/m   Physical Exam   General appearance: alert, cooperative and appears stated age Head: Normocephalic, without obvious abnormality, atraumatic Eyes: conjunctivae/corneas clear. PERRL, EOM's intact. Fundi benign. Ears: normal TM's and external ear canals both ears Nose: Nares normal. Septum midline. Mucosa normal. No drainage or sinus tenderness. Throat: lips, mucosa, and tongue normal; teeth and gums normal Neck: no adenopathy, no carotid bruit, no JVD, supple, symmetrical, trachea midline and thyroid not enlarged, symmetric, no tenderness/mass/nodules Lungs: clear to auscultation bilaterally Breasts: normal appearance, no masses or tenderness Heart: regular rate and rhythm, S1, S2 normal, no murmur, click, rub or gallop Abdomen: soft, non-tender; bowel sounds normal; no masses,  no organomegaly Extremities: extremities normal, atraumatic, no cyanosis or edema Pulses: 2+ and symmetric Skin: Skin color, texture, turgor normal. No rashes or lesions Neurologic: Alert and oriented X 3, normal  strength and tone. Normal symmetric reflexes. Normal coordination and gait.      Assessment & Plan:   Problem List Items Addressed This Visit    Routine general medical examination at a health care facility    Annual comprehensive preventive exam was done as well as an evaluation and management of chronic conditions .  During the course of the visit the patient was educated and counseled about appropriate screening and preventive services including :  diabetes screening, lipid analysis with projected  10 year  risk for CAD , nutrition counseling, breast, cervical and colorectal cancer screening, and recommended immunizations.  Printed recommendations for health maintenance screenings was given      Relevant Orders   TSH (Completed)   Comprehensive metabolic panel (Completed)   Hemoglobin A1c (Completed)   Lipid panel (Completed)   VITAMIN D 25 Hydroxy (Vit-D Deficiency, Fractures) (Completed)   Measles/Mumps/Rubella Immunity   Obesity    She has ben taking a trial of  Phentermine.  She has lost 7 of the anticipated  12 lbs that is her goal by the end of August. She has no side effects.       Anemia, iron deficiency - Primary    Repeat iron studies are normal,   patient has a Mirena IUD and does not menstruate   Lab Results  Component Value Date   WBC 8.4  03/18/2018   HGB 11.5 03/18/2018   HCT 38.1 03/18/2018   MCV 82 03/18/2018   PLT 349 03/18/2018   Lab Results  Component Value Date   IRON 26 (L) 03/18/2018   TIBC 405 03/18/2018   FERRITIN 15 03/18/2018         Relevant Orders   CBC with Differential/Platelet (Completed)   Iron, TIBC and Ferritin Panel (Completed)      I am having [name] maintain her levonorgestrel, citalopram, phentermine, and ALPRAZolam.  Meds ordered this encounter  Medications  . ALPRAZolam (XANAX) 0.25 MG tablet    Sig: Take 1 tablet (0.25 mg total) by mouth 2 (two) times daily as needed for anxiety.    Dispense:  30 tablet     Refill:  1    There are no discontinued medications.  Follow-up: No follow-ups on file.   Sherlene Shams, MD

## 2018-03-18 NOTE — Patient Instructions (Addendum)
You goal is  12  Lb wt loss (from May starting weigh)  by end of Augustt  To continue phentermine refills  I have refilled your alprazolam  For "prn" use   I have increased your dose of citalopram to 40 mg daily  Health Maintenance, Female Adopting a healthy lifestyle and getting preventive care can go a long way to promote health and wellness. Talk with your health care provider about what schedule of regular examinations is right for you. This is a good chance for you to check in with your provider about disease prevention and staying healthy. In between checkups, there are plenty of things you can do on your own. Experts have done a lot of research about which lifestyle changes and preventive measures are most likely to keep you healthy. Ask your health care provider for more information. Weight and diet Eat a healthy diet  Be sure to include plenty of vegetables, fruits, low-fat dairy products, and lean protein.  Do not eat a lot of foods high in solid fats, added sugars, or salt.  Get regular exercise. This is one of the most important things you can do for your health. ? Most adults should exercise for at least 150 minutes each week. The exercise should increase your heart rate and make you sweat (moderate-intensity exercise). ? Most adults should also do strengthening exercises at least twice a week. This is in addition to the moderate-intensity exercise.  Maintain a healthy weight  Body mass index (BMI) is a measurement that can be used to identify possible weight problems. It estimates body fat based on height and weight. Your health care provider can help determine your BMI and help you achieve or maintain a healthy weight.  For females 76 years of age and older: ? A BMI below 18.5 is considered underweight. ? A BMI of 18.5 to 24.9 is normal. ? A BMI of 25 to 29.9 is considered overweight. ? A BMI of 30 and above is considered obese.  Watch levels of cholesterol and blood  lipids  You should start having your blood tested for lipids and cholesterol at 31 years of age, then have this test every 5 years.  You may need to have your cholesterol levels checked more often if: ? Your lipid or cholesterol levels are high. ? You are older than 31 years of age. ? You are at high risk for heart disease.  Cancer screening Lung Cancer  Lung cancer screening is recommended for adults 69-67 years old who are at high risk for lung cancer because of a history of smoking.  A yearly low-dose CT scan of the lungs is recommended for people who: ? Currently smoke. ? Have quit within the past 15 years. ? Have at least a 30-pack-year history of smoking. A pack year is smoking an average of one pack of cigarettes a day for 1 year.  Yearly screening should continue until it has been 15 years since you quit.  Yearly screening should stop if you develop a health problem that would prevent you from having lung cancer treatment.  Breast Cancer  Practice breast self-awareness. This means understanding how your breasts normally appear and feel.  It also means doing regular breast self-exams. Let your health care provider know about any changes, no matter how small.  If you are in your 20s or 30s, you should have a clinical breast exam (CBE) by a health care provider every 1-3 years as part of a regular health exam.  If you are 40 or older, have a CBE every year. Also consider having a breast X-ray (mammogram) every year.  If you have a family history of breast cancer, talk to your health care provider about genetic screening.  If you are at high risk for breast cancer, talk to your health care provider about having an MRI and a mammogram every year.  Breast cancer gene (BRCA) assessment is recommended for women who have family members with BRCA-related cancers. BRCA-related cancers include: ? Breast. ? Ovarian. ? Tubal. ? Peritoneal cancers.  Results of the assessment will  determine the need for genetic counseling and BRCA1 and BRCA2 testing.  Cervical Cancer Your health care provider may recommend that you be screened regularly for cancer of the pelvic organs (ovaries, uterus, and vagina). This screening involves a pelvic examination, including checking for microscopic changes to the surface of your cervix (Pap test). You may be encouraged to have this screening done every 3 years, beginning at age 78.  For women ages 39-65, health care providers may recommend pelvic exams and Pap testing every 3 years, or they may recommend the Pap and pelvic exam, combined with testing for human papilloma virus (HPV), every 5 years. Some types of HPV increase your risk of cervical cancer. Testing for HPV may also be done on women of any age with unclear Pap test results.  Other health care providers may not recommend any screening for nonpregnant women who are considered low risk for pelvic cancer and who do not have symptoms. Ask your health care provider if a screening pelvic exam is right for you.  If you have had past treatment for cervical cancer or a condition that could lead to cancer, you need Pap tests and screening for cancer for at least 20 years after your treatment. If Pap tests have been discontinued, your risk factors (such as having a new sexual partner) need to be reassessed to determine if screening should resume. Some women have medical problems that increase the chance of getting cervical cancer. In these cases, your health care provider may recommend more frequent screening and Pap tests.  Colorectal Cancer  This type of cancer can be detected and often prevented.  Routine colorectal cancer screening usually begins at 31 years of age and continues through 31 years of age.  Your health care provider may recommend screening at an earlier age if you have risk factors for colon cancer.  Your health care provider may also recommend using home test kits to check  for hidden blood in the stool.  A small camera at the end of a tube can be used to examine your colon directly (sigmoidoscopy or colonoscopy). This is done to check for the earliest forms of colorectal cancer.  Routine screening usually begins at age 71.  Direct examination of the colon should be repeated every 5-10 years through 31 years of age. However, you may need to be screened more often if early forms of precancerous polyps or small growths are found.  Skin Cancer  Check your skin from head to toe regularly.  Tell your health care provider about any new moles or changes in moles, especially if there is a change in a mole's shape or color.  Also tell your health care provider if you have a mole that is larger than the size of a pencil eraser.  Always use sunscreen. Apply sunscreen liberally and repeatedly throughout the day.  Protect yourself by wearing long sleeves, pants, a wide-brimmed hat, and  sunglasses whenever you are outside.  Heart disease, diabetes, and high blood pressure  High blood pressure causes heart disease and increases the risk of stroke. High blood pressure is more likely to develop in: ? People who have blood pressure in the high end of the normal range (130-139/85-89 mm Hg). ? People who are overweight or obese. ? People who are African American.  If you are 22-80 years of age, have your blood pressure checked every 3-5 years. If you are 14 years of age or older, have your blood pressure checked every year. You should have your blood pressure measured twice-once when you are at a hospital or clinic, and once when you are not at a hospital or clinic. Record the average of the two measurements. To check your blood pressure when you are not at a hospital or clinic, you can use: ? An automated blood pressure machine at a pharmacy. ? A home blood pressure monitor.  If you are between 19 years and 5 years old, ask your health care provider if you should take  aspirin to prevent strokes.  Have regular diabetes screenings. This involves taking a blood sample to check your fasting blood sugar level. ? If you are at a normal weight and have a low risk for diabetes, have this test once every three years after 31 years of age. ? If you are overweight and have a high risk for diabetes, consider being tested at a younger age or more often. Preventing infection Hepatitis B  If you have a higher risk for hepatitis B, you should be screened for this virus. You are considered at high risk for hepatitis B if: ? You were born in a country where hepatitis B is common. Ask your health care provider which countries are considered high risk. ? Your parents were born in a high-risk country, and you have not been immunized against hepatitis B (hepatitis B vaccine). ? You have HIV or AIDS. ? You use needles to inject street drugs. ? You live with someone who has hepatitis B. ? You have had sex with someone who has hepatitis B. ? You get hemodialysis treatment. ? You take certain medicines for conditions, including cancer, organ transplantation, and autoimmune conditions.  Hepatitis C  Blood testing is recommended for: ? Everyone born from [birth year] through 1964. ? Anyone with known risk factors for hepatitis C.  Sexually transmitted infections (STIs)  You should be screened for sexually transmitted infections (STIs) including gonorrhea and chlamydia if: ? You are sexually active and are younger than 31 years of age. ? You are older than 31 years of age and your health care provider tells you that you are at risk for this type of infection. ? Your sexual activity has changed since you were last screened and you are at an increased risk for chlamydia or gonorrhea. Ask your health care provider if you are at risk.  If you do not have HIV, but are at risk, it may be recommended that you take a prescription medicine daily to prevent HIV infection. This is called  pre-exposure prophylaxis (PrEP). You are considered at risk if: ? You are sexually active and do not regularly use condoms or know the HIV status of your partner(s). ? You take drugs by injection. ? You are sexually active with a partner who has HIV.  Talk with your health care provider about whether you are at high risk of being infected with HIV. If you choose to begin PrEP, you  should first be tested for HIV. You should then be tested every 3 months for as long as you are taking PrEP. Pregnancy  If you are premenopausal and you may become pregnant, ask your health care provider about preconception counseling.  If you may become pregnant, take 400 to 800 micrograms (mcg) of folic acid every day.  If you want to prevent pregnancy, talk to your health care provider about birth control (contraception). Osteoporosis and menopause  Osteoporosis is a disease in which the bones lose minerals and strength with aging. This can result in serious bone fractures. Your risk for osteoporosis can be identified using a bone density scan.  If you are 7 years of age or older, or if you are at risk for osteoporosis and fractures, ask your health care provider if you should be screened.  Ask your health care provider whether you should take a calcium or vitamin D supplement to lower your risk for osteoporosis.  Menopause may have certain physical symptoms and risks.  Hormone replacement therapy may reduce some of these symptoms and risks. Talk to your health care provider about whether hormone replacement therapy is right for you. Follow these instructions at home:  Schedule regular health, dental, and eye exams.  Stay current with your immunizations.  Do not use any tobacco products including cigarettes, chewing tobacco, or electronic cigarettes.  If you are pregnant, do not drink alcohol.  If you are breastfeeding, limit how much and how often you drink alcohol.  Limit alcohol intake to no more  than 1 drink per day for nonpregnant women. One drink equals 12 ounces of beer, 5 ounces of wine, or 1 ounces of hard liquor.  Do not use street drugs.  Do not share needles.  Ask your health care provider for help if you need support or information about quitting drugs.  Tell your health care provider if you often feel depressed.  Tell your health care provider if you have ever been abused or do not feel safe at home. This information is not intended to replace advice given to you by your health care provider. Make sure you discuss any questions you have with your health care provider. Document Released: 02/23/2011 Document Revised: 01/16/2016 Document Reviewed: 05/14/2015 Elsevier Interactive Patient Education  [employer].

## 2018-03-19 LAB — TSH: TSH: 2.87 u[IU]/mL (ref 0.450–4.500)

## 2018-03-19 LAB — IRON,TIBC AND FERRITIN PANEL
Ferritin: 15 ng/mL (ref 15–150)
IRON: 26 ug/dL — AB (ref 27–159)
Iron Saturation: 6 % — CL (ref 15–55)
Total Iron Binding Capacity: 405 ug/dL (ref 250–450)
UIBC: 379 ug/dL (ref 131–425)

## 2018-03-19 LAB — CBC WITH DIFFERENTIAL/PLATELET
BASOS: 0 %
Basophils Absolute: 0 10*3/uL (ref 0.0–0.2)
EOS (ABSOLUTE): 0.1 10*3/uL (ref 0.0–0.4)
EOS: 2 %
HEMATOCRIT: 38.1 % (ref 34.0–46.6)
Hemoglobin: 11.5 g/dL (ref 11.1–15.9)
IMMATURE GRANULOCYTES: 0 %
Immature Grans (Abs): 0 10*3/uL (ref 0.0–0.1)
LYMPHS ABS: 1.9 10*3/uL (ref 0.7–3.1)
Lymphs: 23 %
MCH: 24.7 pg — ABNORMAL LOW (ref 26.6–33.0)
MCHC: 30.2 g/dL — AB (ref 31.5–35.7)
MCV: 82 fL (ref 79–97)
MONOS ABS: 0.5 10*3/uL (ref 0.1–0.9)
Monocytes: 6 %
Neutrophils Absolute: 5.8 10*3/uL (ref 1.4–7.0)
Neutrophils: 69 %
Platelets: 349 10*3/uL (ref 150–450)
RBC: 4.66 x10E6/uL (ref 3.77–5.28)
RDW: 17.1 % — AB (ref 12.3–15.4)
WBC: 8.4 10*3/uL (ref 3.4–10.8)

## 2018-03-19 LAB — COMPREHENSIVE METABOLIC PANEL
A/G RATIO: 1.8 (ref 1.2–2.2)
ALBUMIN: 4.2 g/dL (ref 3.5–5.5)
ALK PHOS: 76 IU/L (ref 39–117)
ALT: 29 IU/L (ref 0–32)
AST: 57 IU/L — ABNORMAL HIGH (ref 0–40)
BILIRUBIN TOTAL: 0.6 mg/dL (ref 0.0–1.2)
BUN / CREAT RATIO: 8 — AB (ref 9–23)
BUN: 8 mg/dL (ref 6–20)
CO2: 22 mmol/L (ref 20–29)
Calcium: 9.2 mg/dL (ref 8.7–10.2)
Chloride: 103 mmol/L (ref 96–106)
Creatinine, Ser: 0.98 mg/dL (ref 0.57–1.00)
GFR calc non Af Amer: 77 mL/min/{1.73_m2} (ref >59)
GFR, EST AFRICAN AMERICAN: 89 mL/min/{1.73_m2} (ref >59)
GLUCOSE: 76 mg/dL (ref 65–99)
Globulin, Total: 2.3 g/dL (ref 1.5–4.5)
POTASSIUM: 4.1 mmol/L (ref 3.5–5.2)
SODIUM: 140 mmol/L (ref 134–144)
Total Protein: 6.5 g/dL (ref 6.0–8.5)

## 2018-03-19 LAB — LIPID PANEL
CHOLESTEROL TOTAL: 167 mg/dL (ref 100–199)
Chol/HDL Ratio: 2.5 ratio (ref 0.0–4.4)
HDL: 66 mg/dL (ref >39)
LDL Calculated: 71 mg/dL (ref 0–99)
TRIGLYCERIDES: 148 mg/dL (ref 0–149)
VLDL CHOLESTEROL CAL: 30 mg/dL (ref 5–40)

## 2018-03-19 LAB — MEASLES/MUMPS/RUBELLA IMMUNITY
MUMPS ABS, IGG: 19.8 AU/mL (ref >10.9)
RUBELLA: 0.91 {index} — AB (ref >0.99)
RUBEOLA AB, IGG: <25 AU/mL — ABNORMAL LOW (ref >29.9)

## 2018-03-19 LAB — HEMOGLOBIN A1C
ESTIMATED AVERAGE GLUCOSE: 103 mg/dL
Hgb A1c MFr Bld: 5.2 % (ref 4.8–5.6)

## 2018-03-19 LAB — VITAMIN D 25 HYDROXY (VIT D DEFICIENCY, FRACTURES): Vit D, 25-Hydroxy: 33.6 ng/mL (ref 30.0–100.0)

## 2018-03-20 NOTE — Assessment & Plan Note (Signed)
Annual comprehensive preventive exam was done as well as an evaluation and management of chronic conditions .  During the course of the visit the patient was educated and counseled about appropriate screening and preventive services including :  diabetes screening, lipid analysis with projected  10 year  risk for CAD , nutrition counseling, breast, cervical and colorectal cancer screening, and recommended immunizations.  Printed recommendations for health maintenance screenings was given 

## 2018-03-20 NOTE — Assessment & Plan Note (Signed)
She has ben taking a trial of  Phentermine.  She has lost 7 of the anticipated  12 lbs that is her goal by the end of August. She has no side effects.

## 2018-03-20 NOTE — Assessment & Plan Note (Signed)
Repeat iron studies are normal,   patient has a Mirena IUD and does not menstruate   Lab Results  Component Value Date   WBC 8.4 03/18/2018   HGB 11.5 03/18/2018   HCT 38.1 03/18/2018   MCV 82 03/18/2018   PLT 349 03/18/2018   Lab Results  Component Value Date   IRON 26 (L) 03/18/2018   TIBC 405 03/18/2018   FERRITIN 15 03/18/2018

## 2018-03-23 ENCOUNTER — Telehealth: Payer: Self-pay

## 2018-03-23 NOTE — Telephone Encounter (Signed)
Copied from CRM [id number]. Topic: Quick Communication - See Telephone Encounter >> Mar 23, 2018 12:58 PM Herby AbrahamJohnson, Shiquita C wrote: CRM for notification. See Telephone encounter for: 03/23/18.  Pt called in to be advised. She said that she would like to know if her results and wellness form was sent to [employer] (her employer) ?  Pt says that she signed a release form at ov.   Please advise.   CB: [phone]  OR sent response via My-Chart.

## 2018-03-23 NOTE — Telephone Encounter (Signed)
Left detailed message letting pt know that the form and her lab results were faxed to number provided on the form.

## 2018-03-24 DIAGNOSIS — Z23 Encounter for immunization: Secondary | ICD-10-CM | POA: Diagnosis not present

## 2018-03-25 ENCOUNTER — Telehealth: Payer: Self-pay | Admitting: Internal Medicine

## 2018-03-25 NOTE — Telephone Encounter (Signed)
LMTCB. Need to let pt know that the form and the labs were faxed to the fax number provided on the form on 03/23/2018.

## 2018-03-25 NOTE — Telephone Encounter (Signed)
Pt dropped off Health Screening form to be signed and faxed out. Fax number on form. Also need labs sent too, see note attached to form. Form is up front in Dr. Melina Schoolsullo's color folder.

## 2018-03-25 NOTE — Telephone Encounter (Signed)
Pt called back to state that she needs the form and the full labs from the 26th of July to faxed back; any questions for pt contact her

## 2018-03-30 NOTE — Telephone Encounter (Signed)
re-faxed

## 2018-05-09 ENCOUNTER — Other Ambulatory Visit: Payer: Self-pay | Admitting: Internal Medicine

## 2018-05-13 ENCOUNTER — Ambulatory Visit: Payer: BLUE CROSS/BLUE SHIELD | Admitting: Internal Medicine

## 2018-05-20 ENCOUNTER — Ambulatory Visit (INDEPENDENT_AMBULATORY_CARE_PROVIDER_SITE_OTHER): Payer: BLUE CROSS/BLUE SHIELD | Admitting: Internal Medicine

## 2018-05-20 ENCOUNTER — Encounter: Payer: Self-pay | Admitting: Internal Medicine

## 2018-05-20 VITALS — BP 114/86 | HR 74 | Temp 98.6°F | Resp 16 | Ht 66.0 in | Wt 228.4 lb

## 2018-05-20 DIAGNOSIS — R790 Abnormal level of blood mineral: Secondary | ICD-10-CM

## 2018-05-20 DIAGNOSIS — R748 Abnormal levels of other serum enzymes: Secondary | ICD-10-CM | POA: Diagnosis not present

## 2018-05-20 DIAGNOSIS — R7401 Elevation of levels of liver transaminase levels: Secondary | ICD-10-CM

## 2018-05-20 DIAGNOSIS — Z23 Encounter for immunization: Secondary | ICD-10-CM

## 2018-05-20 DIAGNOSIS — E6609 Other obesity due to excess calories: Secondary | ICD-10-CM

## 2018-05-20 DIAGNOSIS — Z6832 Body mass index (BMI) 32.0-32.9, adult: Secondary | ICD-10-CM

## 2018-05-20 DIAGNOSIS — R740 Nonspecific elevation of levels of transaminase and lactic acid dehydrogenase [LDH]: Secondary | ICD-10-CM | POA: Diagnosis not present

## 2018-05-20 MED ORDER — PHENTERMINE HCL 37.5 MG PO TABS: 37.5000 mg | ORAL_TABLET | Freq: Every day | ORAL | 2 refills | Status: DC

## 2018-05-20 NOTE — Patient Instructions (Addendum)
I have refilled your phentermine for 3 more months.  You need to lose 11 lbs minimum by your next visit.  If I am going to sign your waiver,  Then you need to be EXERCISING a minimum of 30 minutes daily  DOG WALKING DOES NOT COUNT  UNLESS YOU ARE DOING JUMPING JACKS WHENEVER THEY STOP TO SNIFF

## 2018-05-20 NOTE — Progress Notes (Signed)
Subjective:  Patient ID: Cynthia Lewis, female    DOB: May 28, 1986  Age: 32 y.o. MRN: [id number]  CC: The primary encounter diagnosis was Need for influenza vaccination. Diagnoses of Low iron stores, Elevated AST (SGOT), Class 1 obesity due to excess calories without serious comorbidity with body mass index (BMI) of 32.0 to 32.9 in adult, and Abnormal AST and ALT were also pertinent to this visit.  HPI Cynthia Lewis presents for follow up on medical management of obesity,  And abnormal liver enzymes.    1) Obesity:  She has been tolerating phentermine use for weight loss management .  She has lost 17 lbs since May,  When she started the medication ,  And 10 lbs since late July  Goal was 12 lbs in 3 months by late august )  NOT EXERCISING .  Just walking her dogs .  Requesting Needs the [employer] appeal form to be signed .    Marland Kitchen   2) Abnormal liver enzymes. Patient had a mildly elevated AST in July , but she had just returned form a vacation which involved considerable alcohol intake. She has note had repeat testing yet, but has  reduced her alcohol intake to rare and is now requesting repeat lab testing at [facility].   Outpatient Medications Prior to Visit  Medication Sig Dispense Refill  . ALPRAZolam (XANAX) 0.25 MG tablet Take 1 tablet (0.25 mg total) by mouth 2 (two) times daily as needed for anxiety. 30 tablet 1  . citalopram (CELEXA) 20 MG tablet TAKE 1 TABLET BY MOUTH  DAILY 90 tablet 1  . levonorgestrel (MIRENA) 20 MCG/24HR IUD 1 each by Intrauterine route once.    . phentermine (ADIPEX-P) 37.5 MG tablet Take 1 tablet (37.5 mg total) by mouth daily before breakfast. 30 tablet 2   No facility-administered medications prior to visit.     Review of Systems;  Patient denies headache, fevers, malaise, unintentional weight loss, skin rash, eye pain, sinus congestion and sinus pain, sore throat, dysphagia,  hemoptysis , cough, dyspnea, wheezing, chest pain, palpitations, orthopnea,  edema, abdominal pain, nausea, melena, diarrhea, constipation, flank pain, dysuria, hematuria, urinary  Frequency, nocturia, numbness, tingling, seizures,  Focal weakness, Loss of consciousness,  Tremor, insomnia, depression, anxiety, and suicidal ideation.      Objective:  BP 114/86 (BP Location: Left Arm, Patient Position: Sitting, Cuff Size: Large)   Pulse 74   Temp 98.6 F (37 C) (Oral)   Resp 16   Ht 5\' 6"  (1.676 m)   Wt 228 lb 6.4 oz (103.6 kg)   SpO2 98%   BMI 36.86 kg/m   BP Readings from Last 3 Encounters:  05/20/18 114/86  03/18/18 104/78  01/12/18 130/86    Wt Readings from Last 3 Encounters:  05/20/18 228 lb 6.4 oz (103.6 kg)  03/18/18 238 lb (108 kg)  01/12/18 245 lb 9.6 oz (111.4 kg)    General appearance: alert, cooperative and appears stated age Ears: normal TM's and external ear canals both ears Throat: lips, mucosa, and tongue normal; teeth and gums normal Neck: no adenopathy, no carotid bruit, supple, symmetrical, trachea midline and thyroid not enlarged, symmetric, no tenderness/mass/nodules Back: symmetric, no curvature. ROM normal. No CVA tenderness. Lungs: clear to auscultation bilaterally Heart: regular rate and rhythm, S1, S2 normal, no murmur, click, rub or gallop Abdomen: soft, non-tender; bowel sounds normal; no masses,  no organomegaly Pulses: 2+ and symmetric Skin: Skin color, texture, turgor normal. No rashes or lesions Lymph nodes: Cervical,  supraclavicular, and axillary nodes normal.  Lab Results  Component Value Date   HGBA1C 5.2 03/18/2018   HGBA1C 5.2 03/26/2017   HGBA1C 5.3 03/15/2017    Lab Results  Component Value Date   CREATININE 0.98 03/18/2018   CREATININE 0.93 03/26/2017   CREATININE CANCELED 03/15/2017    Lab Results  Component Value Date   WBC 8.4 03/18/2018   HGB 11.5 03/18/2018   HCT 38.1 03/18/2018   PLT 349 03/18/2018   GLUCOSE 76 03/18/2018   CHOL 167 03/18/2018   TRIG 148 03/18/2018   HDL 66 03/18/2018     LDLCALC 71 03/18/2018   ALT 12 05/20/2018   AST 18 05/20/2018   NA 140 03/18/2018   K 4.1 03/18/2018   CL 103 03/18/2018   CREATININE 0.98 03/18/2018   BUN 8 03/18/2018   CO2 22 03/18/2018   TSH 2.870 03/18/2018   HGBA1C 5.2 03/18/2018    No results found.  Assessment & Plan:   Problem List Items Addressed This Visit    Abnormal AST and ALT    Prior elevation attributed to excessive alcohol ingestion during recent vacation.  If repeat enzymes are  Elevated, I advised her that she will need workup or other causes including viral hepatitis, autoimmune disorders, and hepatic steatosis      Obesity    Continue phentermine for management of increased appetite .  Diet reviewed .  Patient has not been exercising as recommended;  Reminded her that 30 minutes of aerobic exercise is the minimum amount she needs to do daily to ensure success and enable me to sign the waiver in good faith.       Relevant Medications   phentermine (ADIPEX-P) 37.5 MG tablet    Other Visit Diagnoses    Need for influenza vaccination    -  Primary   Relevant Orders   Flu Vaccine QUAD 6+ mos PF IM (Fluarix Quad PF) (Completed)   Low iron stores       Relevant Orders   Iron, TIBC and Ferritin Panel (Completed)   Elevated AST (SGOT)       Relevant Orders   Hepatic function panel (Completed)    A total of 25 minutes of face to face time was spent with patient more than half of which was spent in counselling about the above mentioned conditions  and coordination of care   I am having [name] maintain her levonorgestrel, ALPRAZolam, citalopram, and phentermine.  Meds ordered this encounter  Medications  . phentermine (ADIPEX-P) 37.5 MG tablet    Sig: Take 1 tablet (37.5 mg total) by mouth daily before breakfast.    Dispense:  30 tablet    Refill:  2    Medications Discontinued During This Encounter  Medication Reason  . phentermine (ADIPEX-P) 37.5 MG tablet Reorder    Follow-up:  Return in about 3 months (around 08/19/2018).   Sherlene Shams, MD

## 2018-05-21 LAB — HEPATIC FUNCTION PANEL
ALK PHOS: 71 IU/L (ref 39–117)
ALT: 12 IU/L (ref 0–32)
AST: 18 IU/L (ref 0–40)
Albumin: 4.2 g/dL (ref 3.5–5.5)
BILIRUBIN TOTAL: 0.5 mg/dL (ref 0.0–1.2)
BILIRUBIN, DIRECT: 0.13 mg/dL (ref 0.00–0.40)
Total Protein: 6.7 g/dL (ref 6.0–8.5)

## 2018-05-21 LAB — IRON,TIBC AND FERRITIN PANEL
Ferritin: 22 ng/mL (ref 15–150)
IRON SATURATION: 11 % — AB (ref 15–55)
Iron: 40 ug/dL (ref 27–159)
TIBC: 362 ug/dL (ref 250–450)
UIBC: 322 ug/dL (ref 131–425)

## 2018-05-22 DIAGNOSIS — R748 Abnormal levels of other serum enzymes: Secondary | ICD-10-CM | POA: Insufficient documentation

## 2018-05-22 NOTE — Assessment & Plan Note (Signed)
Continue phentermine for management of increased appetite .  Diet reviewed .  Patient has not been exercising as recommended;  Reminded her that 30 minutes of aerobic exercise is the minimum amount she needs to do daily to ensure success and enable me to sign the waiver in good faith.

## 2018-05-22 NOTE — Assessment & Plan Note (Addendum)
Prior elevation attributed to excessive alcohol ingestion during recent vacation.  If repeat enzymes are  Elevated, I advised her that she will need workup or other causes including viral hepatitis, autoimmune disorders, and hepatic steatosis

## 2018-06-07 ENCOUNTER — Encounter: Payer: Self-pay | Admitting: Podiatry

## 2018-06-07 ENCOUNTER — Ambulatory Visit: Payer: BLUE CROSS/BLUE SHIELD | Admitting: Podiatry

## 2018-06-07 DIAGNOSIS — M722 Plantar fascial fibromatosis: Secondary | ICD-10-CM | POA: Diagnosis not present

## 2018-06-07 MED ORDER — DICLOFENAC SODIUM 75 MG PO TBEC: 75.0000 mg | DELAYED_RELEASE_TABLET | Freq: Two times a day (BID) | ORAL | 1 refills | Status: DC

## 2018-06-10 ENCOUNTER — Ambulatory Visit (INDEPENDENT_AMBULATORY_CARE_PROVIDER_SITE_OTHER): Payer: BLUE CROSS/BLUE SHIELD | Admitting: Internal Medicine

## 2018-06-10 ENCOUNTER — Encounter: Payer: Self-pay | Admitting: Internal Medicine

## 2018-06-10 DIAGNOSIS — G43909 Migraine, unspecified, not intractable, without status migrainosus: Secondary | ICD-10-CM | POA: Insufficient documentation

## 2018-06-10 DIAGNOSIS — Z6832 Body mass index (BMI) 32.0-32.9, adult: Secondary | ICD-10-CM | POA: Diagnosis not present

## 2018-06-10 DIAGNOSIS — G43829 Menstrual migraine, not intractable, without status migrainosus: Secondary | ICD-10-CM

## 2018-06-10 DIAGNOSIS — E6609 Other obesity due to excess calories: Secondary | ICD-10-CM | POA: Diagnosis not present

## 2018-06-10 MED ORDER — ONDANSETRON 4 MG PO TBDP: 4.0000 mg | ORAL_TABLET | Freq: Three times a day (TID) | ORAL | 0 refills | Status: DC | PRN

## 2018-06-10 NOTE — Progress Notes (Signed)
   Subjective: 32 year old female presenting today for follow up evaluation of bilateral plantar fasciitis. She states the pain had improved significantly after receiving the injections but returned about one month ago. She has been using her custom orthotics and taking Diclofenac with some relief. Walking and standing for long periods of time increases the pain. Patient is here for further evaluation and treatment.   Past Medical History:  Diagnosis Date  . Anxiety   . Hypertension      Objective: Physical Exam General: The patient is alert and oriented x3 in no acute distress.  Dermatology: Skin is warm, dry and supple bilateral lower extremities. Negative for open lesions or macerations bilateral.   Vascular: Dorsalis Pedis and Posterior Tibial pulses palpable bilateral.  Capillary fill time is immediate to all digits.  Neurological: Epicritic and protective threshold intact bilateral.   Musculoskeletal: Tenderness to palpation at the medial calcaneal tubercale and through the insertion of the plantar fascia of the bilateral feet. All other joints range of motion within normal limits bilateral. Strength 5/5 in all groups bilateral.   Assessment: 1. plantar fasciitis bilateral feet   Plan of Care:  1. Patient evaluated.  2. Injection of 0.5cc Celestone soluspan injected into the bilateral heels.  3. Continue using custom orthotics.  4. Refill prescription for Diclofenac 75 mg provided to patient.  5. Return to clinic as needed.   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   Felecia Shelling, DPM [facility]  Dr. Felecia Shelling, DPM    [address] [place], [place] [address]                Office [phone]  Fax [phone]

## 2018-06-10 NOTE — Progress Notes (Signed)
Subjective:  Patient ID: Cynthia Lewis, female    DOB: 1986-07-03  Age: 32 y.o. MRN: [id number]  CC: Diagnoses of Menstrual migraine without status migrainosus, not intractable and Class 1 obesity due to excess calories without serious comorbidity with body mass index (BMI) of 32.0 to 32.9 in adult were pertinent to this visit.  HPI Cynthia Lewis presents for the purpose of obtaining FMLA authorization for work absences due to migraine headaches  History of migraines; started at age of 72,  Associated with hormonal and seasonal changes.  Migraines will present  with pain behind left eye,  Accompanied by Light sensitivity,  Diaphoresis,  scotoma,  And nausea.  Most headaches will resolve with advil if she can takre it early in the migraine and lie down for a few hours.  She has had to leave work if the migraine occurs on a work day but has never had more than an 8 hour absence     Occurring at a frequency of  once  Per month,     Other meds tried :  maxalt ,  Different doses,   Did not tolerate the med,  Made her feel groggy and lethargic,  Afraid to drive on even the lowest dose.    Last episode was Monday.  FMLA forms were advised by supervisor and are due by   2)  Obesity:  Seen 2 weeks ago to discuss pharmacotherapy for obesity,   Has been taking phentermine since last visit.  Weight gain of 2 lbs noted.  Patient reports dietary nonadherence and indiscretions due to recent out of town trip,  And is not exercising regularly yet.     Outpatient Medications Prior to Visit  Medication Sig Dispense Refill  . ALPRAZolam (XANAX) 0.25 MG tablet Take 1 tablet (0.25 mg total) by mouth 2 (two) times daily as needed for anxiety. 30 tablet 1  . citalopram (CELEXA) 20 MG tablet TAKE 1 TABLET BY MOUTH  DAILY 90 tablet 1  . diclofenac (VOLTAREN) 75 MG EC tablet Take 1 tablet (75 mg total) by mouth 2 (two) times daily. 60 tablet 1  . levonorgestrel (MIRENA) 20 MCG/24HR IUD 1 each by Intrauterine  route once.    . phentermine (ADIPEX-P) 37.5 MG tablet Take 1 tablet (37.5 mg total) by mouth daily before breakfast. 30 tablet 2   No facility-administered medications prior to visit.     Review of Systems;  Patient denies , fevers, malaise, unintentional weight loss, skin rash, eye pain, sinus congestion and sinus pain, sore throat, dysphagia,  hemoptysis , cough, dyspnea, wheezing, chest pain, palpitations, orthopnea, edema, abdominal pain, nausea, melena, diarrhea, constipation, flank pain, dysuria, hematuria, urinary  Frequency, nocturia, numbness, tingling, seizures,  Focal weakness, Loss of consciousness,  Tremor, insomnia, depression, anxiety, and suicidal ideation.      Objective:  BP 122/84 (BP Location: Left Arm, Patient Position: Sitting, Cuff Size: Large)   Pulse 69   Temp 98.7 F (37.1 C) (Oral)   Resp 15   Ht 5\' 6"  (1.676 m)   Wt 232 lb 3.2 oz (105.3 kg)   SpO2 98%   BMI 37.48 kg/m   BP Readings from Last 3 Encounters:  06/10/18 122/84  05/20/18 114/86  03/18/18 104/78    Wt Readings from Last 3 Encounters:  06/10/18 232 lb 3.2 oz (105.3 kg)  05/20/18 228 lb 6.4 oz (103.6 kg)  03/18/18 238 lb (108 kg)    General appearance: alert, cooperative and appears stated age Ears:  normal TM's and external ear canals both ears Throat: lips, mucosa, and tongue normal; teeth and gums normal Neck: no adenopathy, no carotid bruit, supple, symmetrical, trachea midline and thyroid not enlarged, symmetric, no tenderness/mass/nodules Lungs: clear to auscultation bilaterally Heart: regular rate and rhythm, S1, S2 normal, no murmur, click, rub or gallop Lymph nodes: Cervical, supraclavicular, and axillary nodes normal. Neuro:  awake and interactive with normal mood and affect. Higher cortical functions are normal. Speech is clear without word-finding difficulty or dysarthria. Extraocular movements are intact. Visual fields of both eyes are grossly intact. Sensation to light touch  is grossly intact bilaterally of upper and lower extremities. Motor examination shows 4+/5 symmetric hand grip and upper extremity and 5/5 lower extremity strength. There is no pronation or drift. Gait is non-ataxic    Lab Results  Component Value Date   HGBA1C 5.2 03/18/2018   HGBA1C 5.2 03/26/2017   HGBA1C 5.3 03/15/2017    Lab Results  Component Value Date   CREATININE 0.98 03/18/2018   CREATININE 0.93 03/26/2017   CREATININE CANCELED 03/15/2017    Lab Results  Component Value Date   WBC 8.4 03/18/2018   HGB 11.5 03/18/2018   HCT 38.1 03/18/2018   PLT 349 03/18/2018   GLUCOSE 76 03/18/2018   CHOL 167 03/18/2018   TRIG 148 03/18/2018   HDL 66 03/18/2018   LDLCALC 71 03/18/2018   ALT 12 05/20/2018   AST 18 05/20/2018   NA 140 03/18/2018   K 4.1 03/18/2018   CL 103 03/18/2018   CREATININE 0.98 03/18/2018   BUN 8 03/18/2018   CO2 22 03/18/2018   TSH 2.870 03/18/2018   HGBA1C 5.2 03/18/2018    No results found.  Assessment & Plan:   Problem List Items Addressed This Visit    Migraine headache    Hormonally mediated but also triggered by weather/barometric pressure changes.  Frequency once/month,  treated with NSAIDS and anti emetics and rest.  Requires FMLA for 8 hours per month .  Forms to be completed       Obesity    obesity with recent initiation of phentermine for appetite suppression.  She is tolerating the medication without side effects .  She is taking the medication twice daily in a divided dose and  has NOT lost   weight in the last 3weeks due to nonadherence to plan.   Weight loss goal is 12 lbs by end of December.  I have informed the patient that I will not sign the obesity tax waiver more than one time for any patient.         A total of 25 minutes of face to face time was spent with patient more than half of which was spent in counselling about the above mentioned conditions  and coordination of care   I am having Cynthia Lewis start on  ondansetron. I am also having her maintain her levonorgestrel, ALPRAZolam, citalopram, phentermine, and diclofenac.  Meds ordered this encounter  Medications  . ondansetron (ZOFRAN ODT) 4 MG disintegrating tablet    Sig: Take 1 tablet (4 mg total) by mouth every 8 (eight) hours as needed for nausea or vomiting.    Dispense:  20 tablet    Refill:  0    KEEP ON FILE FOR FUTURE NEEDS    There are no discontinued medications.  Follow-up: Return in about 3 months (around 09/10/2018) for PHENTERMINE.   Sherlene Shams, MD

## 2018-06-10 NOTE — Patient Instructions (Signed)
I WILL HAVE YOUR FMLA FORMS COMPLETED AND FAXED ON Monday   ZOFRAN ORALLY DISSOLVING TABLET SENT TO PHARMACY  TO USE AS NEEDED FOR NAUSEA    Recurrent Migraine Headache A migraine headache is very bad, throbbing pain that is usually on one side of your head. Recurrent migraines keep coming back (recurring). Talk with your doctor about what things may bring on (trigger) your migraine headaches. Follow these instructions at home: Medicines  Take over-the-counter and prescription medicines only as told by your doctor.  Do not drive or use heavy machinery while taking prescription pain medicine. Lifestyle  Do not use any products that contain nicotine or tobacco, such as cigarettes and e-cigarettes. If you need help quitting, ask your doctor.  Limit alcohol intake to no more than 1 drink a day for nonpregnant women and 2 drinks a day for men. One drink equals 12 oz of beer, 5 oz of wine, or 1 oz of hard liquor.  Get 7-9 hours of sleep each night.  Lessen any stress in your life. Ask your doctor about ways to lower your stress.  Stay at a healthy weight. Talk with your doctor if you need help losing weight.  Get regular exercise. General instructions  Keep a journal to find out if certain things bring on migraine headaches. For example, write down: ? What you eat and drink. ? How much sleep you get. ? Any change to your diet or medicines.  Lie down in a dark, quiet room when you have a migraine.  Try placing a cool towel over your head when you have a migraine.  Keep lights dim if bright lights bother you or make your migraines worse.  Keep all follow-up visits as told by your doctor. This is important. Contact a doctor if:  Medicine does not help your migraines.  Your pain keeps coming back.  You have a fever.  You have weight loss without trying. Get help right away if:  Your migraine becomes really bad and medicine does not help.  You have a stiff neck.  You have  trouble seeing.  Your muscles are weak or you lose control of your muscles.  You lose your balance or have trouble walking.  You feel like you will pass out (faint) or you pass out.  You have really bad symptoms that are different than your first symptoms.  You start having sudden, very bad headaches that last for one second or less, like a thunderclap. Summary  A migraine headache is very bad, throbbing pain that is usually on one side of your head.  Talk with your doctor about what things may bring on (trigger) your migraine headaches.  Take over-the-counter and prescription medicines only as told by your doctor.  Lie down in a dark, quiet room when you have a migraine.  Keep a journal about what you eat and drink, how much sleep you get, and any changes to your medicines. This can help you find out if certain things make you have migraine headaches. This information is not intended to replace advice given to you by your health care provider. Make sure you discuss any questions you have with your health care provider. Document Released: 05/19/2008 Document Revised: 07/03/2016 Document Reviewed: 07/03/2016 Elsevier Interactive Patient Education  2017 [employer].

## 2018-06-12 NOTE — Assessment & Plan Note (Signed)
obesity with recent initiation of phentermine for appetite suppression.  She is tolerating the medication without side effects .  She is taking the medication twice daily in a divided dose and  has NOT lost   weight in the last 3weeks due to nonadherence to plan.   Weight loss goal is 12 lbs by end of December.  I have informed the patient that I will not sign the obesity tax waiver more than one time for any patient.

## 2018-06-12 NOTE — Assessment & Plan Note (Signed)
Hormonally mediated but also triggered by weather/barometric pressure changes.  Frequency once/month,  treated with NSAIDS and anti emetics and rest.  Requires FMLA for 8 hours per month .  Forms to be completed  

## 2018-06-14 DIAGNOSIS — Z0279 Encounter for issue of other medical certificate: Secondary | ICD-10-CM

## 2018-07-14 DIAGNOSIS — Z6838 Body mass index (BMI) 38.0-38.9, adult: Secondary | ICD-10-CM | POA: Diagnosis not present

## 2018-07-14 DIAGNOSIS — Z113 Encounter for screening for infections with a predominantly sexual mode of transmission: Secondary | ICD-10-CM | POA: Diagnosis not present

## 2018-07-14 DIAGNOSIS — Z975 Presence of (intrauterine) contraceptive device: Secondary | ICD-10-CM | POA: Diagnosis not present

## 2018-07-14 DIAGNOSIS — Z130 Encounter for screening for diseases of the blood and blood-forming organs and certain disorders involving the immune mechanism: Secondary | ICD-10-CM | POA: Diagnosis not present

## 2018-07-14 DIAGNOSIS — Z01419 Encounter for gynecological examination (general) (routine) without abnormal findings: Secondary | ICD-10-CM | POA: Diagnosis not present

## 2018-07-26 DIAGNOSIS — L812 Freckles: Secondary | ICD-10-CM | POA: Diagnosis not present

## 2018-07-26 DIAGNOSIS — D485 Neoplasm of uncertain behavior of skin: Secondary | ICD-10-CM | POA: Diagnosis not present

## 2018-07-26 DIAGNOSIS — Z1283 Encounter for screening for malignant neoplasm of skin: Secondary | ICD-10-CM | POA: Diagnosis not present

## 2018-07-26 DIAGNOSIS — D229 Melanocytic nevi, unspecified: Secondary | ICD-10-CM | POA: Diagnosis not present

## 2018-07-26 DIAGNOSIS — D225 Melanocytic nevi of trunk: Secondary | ICD-10-CM | POA: Diagnosis not present

## 2018-09-14 ENCOUNTER — Ambulatory Visit (INDEPENDENT_AMBULATORY_CARE_PROVIDER_SITE_OTHER): Payer: Managed Care, Other (non HMO) | Admitting: Internal Medicine

## 2018-09-14 ENCOUNTER — Encounter: Payer: Self-pay | Admitting: Internal Medicine

## 2018-09-14 VITALS — BP 106/72 | HR 96 | Temp 98.8°F | Resp 15 | Ht 66.0 in | Wt 215.0 lb

## 2018-09-14 DIAGNOSIS — R748 Abnormal levels of other serum enzymes: Secondary | ICD-10-CM

## 2018-09-14 DIAGNOSIS — E6609 Other obesity due to excess calories: Secondary | ICD-10-CM | POA: Diagnosis not present

## 2018-09-14 DIAGNOSIS — Z6832 Body mass index (BMI) 32.0-32.9, adult: Secondary | ICD-10-CM

## 2018-09-14 MED ORDER — PHENTERMINE HCL 37.5 MG PO TABS: 37.5000 mg | ORAL_TABLET | Freq: Every day | ORAL | 2 refills | Status: DC

## 2018-09-14 NOTE — Progress Notes (Signed)
Subjective:  Patient ID: Cynthia Lewis, female    DOB: 12/22/1985  Age: 33 y.o. MRN: [id number]  CC: The primary encounter diagnosis was Abnormal AST and ALT. A diagnosis of Class 1 obesity due to excess calories without serious comorbidity with body mass index (BMI) of 32.0 to 32.9 in adult was also pertinent to this visit.  HPI ARIS STRELOW presents for follow up on weight loss management with phentermine  She has lost 17 lb since her last visit 3 months ago.  .  She is tolerating the medication without side effects.  She is  exercising 1 time per week      Outpatient Medications Prior to Visit  Medication Sig Dispense Refill  . ALPRAZolam (XANAX) 0.25 MG tablet Take 1 tablet (0.25 mg total) by mouth 2 (two) times daily as needed for anxiety. 30 tablet 1  . citalopram (CELEXA) 20 MG tablet TAKE 1 TABLET BY MOUTH  DAILY 90 tablet 1  . diclofenac (VOLTAREN) 75 MG EC tablet Take 1 tablet (75 mg total) by mouth 2 (two) times daily. 60 tablet 1  . levonorgestrel (MIRENA) 20 MCG/24HR IUD 1 each by Intrauterine route once.    . ondansetron (ZOFRAN ODT) 4 MG disintegrating tablet Take 1 tablet (4 mg total) by mouth every 8 (eight) hours as needed for nausea or vomiting. 20 tablet 0  . phentermine (ADIPEX-P) 37.5 MG tablet Take 1 tablet (37.5 mg total) by mouth daily before breakfast. 30 tablet 2   No facility-administered medications prior to visit.     Review of Systems;  Patient denies headache, fevers, malaise, unintentional weight loss, skin rash, eye pain, sinus congestion and sinus pain, sore throat, dysphagia,  hemoptysis , cough, dyspnea, wheezing, chest pain, palpitations, orthopnea, edema, abdominal pain, nausea, melena, diarrhea, constipation, flank pain, dysuria, hematuria, urinary  Frequency, nocturia, numbness, tingling, seizures,  Focal weakness, Loss of consciousness,  Tremor, insomnia, depression, anxiety, and suicidal ideation.      Objective:  BP 106/72 (BP  Location: Left Arm, Patient Position: Sitting, Cuff Size: Large)   Pulse 96   Temp 98.8 F (37.1 C) (Oral)   Resp 15   Ht 5\' 6"  (1.676 m)   Wt 215 lb (97.5 kg)   SpO2 98%   BMI 34.70 kg/m   BP Readings from Last 3 Encounters:  09/14/18 106/72  06/10/18 122/84  05/20/18 114/86    Wt Readings from Last 3 Encounters:  09/14/18 215 lb (97.5 kg)  06/10/18 232 lb 3.2 oz (105.3 kg)  05/20/18 228 lb 6.4 oz (103.6 kg)    General appearance: alert, cooperative and appears stated age Lungs: clear to auscultation bilaterally Heart: regular rate and rhythm, S1, S2 normal, no murmur, click, rub or gallop Abdomen: soft, non-tender; bowel sounds normal; no masses,  no organomegaly Pulses: 2+ and symmetric Skin: Skin color, texture, turgor normal. No rashes or lesions Lymph nodes: Cervical, supraclavicular, and axillary nodes normal.  Lab Results  Component Value Date   HGBA1C 5.2 03/18/2018   HGBA1C 5.2 03/26/2017   HGBA1C 5.3 03/15/2017    Lab Results  Component Value Date   CREATININE 0.98 03/18/2018   CREATININE 0.93 03/26/2017   CREATININE CANCELED 03/15/2017    Lab Results  Component Value Date   WBC 8.4 03/18/2018   HGB 11.5 03/18/2018   HCT 38.1 03/18/2018   PLT 349 03/18/2018   GLUCOSE 76 03/18/2018   CHOL 167 03/18/2018   TRIG 148 03/18/2018   HDL 66 03/18/2018  LDLCALC 71 03/18/2018   ALT 12 05/20/2018   AST 18 05/20/2018   NA 140 03/18/2018   K 4.1 03/18/2018   CL 103 03/18/2018   CREATININE 0.98 03/18/2018   BUN 8 03/18/2018   CO2 22 03/18/2018   TSH 2.870 03/18/2018   HGBA1C 5.2 03/18/2018    No results found.  Assessment & Plan:   Problem List Items Addressed This Visit    Abnormal AST and ALT - Primary   Relevant Orders   Comprehensive metabolic panel   Obesity    She has had difficulty losing weight due to increased appetite and is doing well with a trial of  Phentermine.  She is not having any  side effects and risks and understands that     The medication will be discontinued if she has not lost 5% of her body weight over the next 3 months, which , based on today's weight is 12 lbs.  Encouraged to exercise more with a goal of 5 days per week.       Relevant Medications   phentermine (ADIPEX-P) 37.5 MG tablet      I am having Sharma CovertKathryn E. Martinezlopez maintain her levonorgestrel, ALPRAZolam, citalopram, diclofenac, ondansetron, and phentermine.  Meds ordered this encounter  Medications  . phentermine (ADIPEX-P) 37.5 MG tablet    Sig: Take 1 tablet (37.5 mg total) by mouth daily before breakfast.    Dispense:  30 tablet    Refill:  2    Medications Discontinued During This Encounter  Medication Reason  . phentermine (ADIPEX-P) 37.5 MG tablet Reorder    Follow-up: Return in about 3 months (around 12/14/2018) for weight loss management .   Sherlene Shamseresa L Enedelia Martorelli, MD

## 2018-09-14 NOTE — Patient Instructions (Addendum)
I have refilled your phentermine for 3 more months  Goal weight loss is 11 lbs by next visit in April.  Increase exercise gradually with goal 30 mintues of cardio 5 days per week plus weight lifting 3 days per week   Regarding alcohol:  Moderate consumption for women is one 6 ounce glass of wine,  1 shot of liquor ( 1.5 ounces ),  Or one 16 ounce beer

## 2018-09-15 NOTE — Assessment & Plan Note (Signed)
She has had difficulty losing weight due to increased appetite and is doing well with a trial of  Phentermine.  She is not having any  side effects and risks and understands that    The medication will be discontinued if she has not lost 5% of her body weight over the next 3 months, which , based on today's weight is 12 lbs.  Encouraged to exercise more with a goal of 5 days per week.

## 2018-10-26 DIAGNOSIS — Z0279 Encounter for issue of other medical certificate: Secondary | ICD-10-CM

## 2018-11-17 ENCOUNTER — Encounter: Payer: Self-pay | Admitting: Podiatry

## 2018-11-17 ENCOUNTER — Telehealth: Payer: Self-pay | Admitting: Podiatry

## 2018-11-17 DIAGNOSIS — M722 Plantar fascial fibromatosis: Secondary | ICD-10-CM

## 2018-11-17 NOTE — Telephone Encounter (Signed)
Pt returned call and verified that her insurance is now [place] and is wanting to proceed.She is aware that if not covered she will be responsible for the cost of 398.00

## 2018-11-17 NOTE — Telephone Encounter (Signed)
left message for pt to call to confirm she is wanting an additional pair of orthotics and the cost is 398.00 and we can bill insurance but not sure if covered or not.

## 2018-12-03 LAB — COMPREHENSIVE METABOLIC PANEL WITH GFR
ALT: 11 [IU]/L (ref 0–32)
AST: 14 [IU]/L (ref 0–40)
Albumin/Globulin Ratio: 1.6 (ref 1.2–2.2)
Albumin: 4.1 g/dL (ref 3.8–4.8)
Alkaline Phosphatase: 74 [IU]/L (ref 39–117)
BUN/Creatinine Ratio: 12 (ref 9–23)
BUN: 13 mg/dL (ref 6–20)
Bilirubin Total: 0.5 mg/dL (ref 0.0–1.2)
CO2: 24 mmol/L (ref 20–29)
Calcium: 9.9 mg/dL (ref 8.7–10.2)
Chloride: 102 mmol/L (ref 96–106)
Creatinine, Ser: 1.05 mg/dL — ABNORMAL HIGH (ref 0.57–1.00)
GFR calc Af Amer: 82 mL/min/{1.73_m2}
GFR calc non Af Amer: 71 mL/min/{1.73_m2}
Globulin, Total: 2.6 g/dL (ref 1.5–4.5)
Glucose: 70 mg/dL (ref 65–99)
Potassium: 4.3 mmol/L (ref 3.5–5.2)
Sodium: 142 mmol/L (ref 134–144)
Total Protein: 6.7 g/dL (ref 6.0–8.5)

## 2018-12-08 ENCOUNTER — Telehealth: Payer: Self-pay | Admitting: Podiatry

## 2018-12-08 NOTE — Telephone Encounter (Signed)
Called pt to advise 2nd pair of orthotics in.Pt requested them be mailed to her at her home in [place]. I verified address and included instructions.

## 2018-12-13 ENCOUNTER — Other Ambulatory Visit: Payer: Self-pay | Admitting: Internal Medicine

## 2018-12-16 ENCOUNTER — Ambulatory Visit: Payer: Managed Care, Other (non HMO) | Admitting: Internal Medicine

## 2018-12-20 ENCOUNTER — Other Ambulatory Visit: Payer: Self-pay

## 2018-12-20 ENCOUNTER — Ambulatory Visit (INDEPENDENT_AMBULATORY_CARE_PROVIDER_SITE_OTHER): Payer: Managed Care, Other (non HMO) | Admitting: Internal Medicine

## 2018-12-20 DIAGNOSIS — F411 Generalized anxiety disorder: Secondary | ICD-10-CM

## 2018-12-20 DIAGNOSIS — E6609 Other obesity due to excess calories: Secondary | ICD-10-CM

## 2018-12-20 DIAGNOSIS — G43829 Menstrual migraine, not intractable, without status migrainosus: Secondary | ICD-10-CM

## 2018-12-20 DIAGNOSIS — M722 Plantar fascial fibromatosis: Secondary | ICD-10-CM

## 2018-12-20 DIAGNOSIS — Z6832 Body mass index (BMI) 32.0-32.9, adult: Secondary | ICD-10-CM

## 2018-12-20 MED ORDER — PHENTERMINE HCL 37.5 MG PO TABS: 37.5000 mg | ORAL_TABLET | Freq: Every day | ORAL | 2 refills | Status: DC

## 2018-12-20 NOTE — Progress Notes (Signed)
Virtual Visit via [email or web address]  This visit type was conducted due to national recommendations for restrictions regarding the COVID-19 pandemic (e.g. social distancing).  This format is felt to be most appropriate for this patient at this time.  All issues noted in this document were discussed and addressed.  No physical exam was performed (except for noted visual exam findings with Video Visits).   I connected with@ on 12/20/18 at  3:00 PM EDT by a video enabled telemedicine application  And  verified that I am speaking with the correct person using two identifiers. Location patient: home Location provider: work  Persons participating in the virtual visit: patient, provider  I discussed the limitations, risks, security and privacy concerns of performing an evaluation and management service by telephone and the availability of in person appointments. I also discussed with the patient that there may be a patient responsible charge related to this service. The patient expressed understanding and agreed to proceed.  Reason for visit: follow up on pharmacotherapy for overwieight, anxiety   HPI:  Seeing podiatry for plantar fasciitis .  Trying yoga which seems to be helping.  Gained a few lbs initially but has settled into a routine  Not buying the food that's off limits Migraine headaches,  Needs FMLA filled out . Has been averaging zero headaches ,  But spring and summer time are usually the worse seasons due to temperature change,  One day duration .  Max 2-3 days per month  Historically has not missed more than 2 days per 6 month period.   ROS: See pertinent positives and negatives per HPI.  Past Medical History:  Diagnosis Date  . Anxiety   . Hypertension     No past surgical history on file.  Family History  Problem Relation Age of Onset  . Ovarian cancer Mother   . Anxiety disorder Father   . Diabetes Father   . Hypertension Father   . Hyperlipidemia Father   . Cancer Paternal  Grandmother 23       breast ca  . Diabetes Paternal Grandmother   . Anxiety disorder Maternal Grandmother     SOCIAL HX: single , no tobacco.  Moderate alcohol   Current Outpatient Medications:  .  ALPRAZolam (XANAX) 0.25 MG tablet, Take 1 tablet (0.25 mg total) by mouth 2 (two) times daily as needed for anxiety., Disp: 30 tablet, Rfl: 1 .  citalopram (CELEXA) 20 MG tablet, TAKE 1 TABLET BY MOUTH  DAILY, Disp: 90 tablet, Rfl: 1 .  diclofenac (VOLTAREN) 75 MG EC tablet, Take 1 tablet (75 mg total) by mouth 2 (two) times daily., Disp: 60 tablet, Rfl: 1 .  levonorgestrel (MIRENA) 20 MCG/24HR IUD, 1 each by Intrauterine route once., Disp: , Rfl:  .  ondansetron (ZOFRAN ODT) 4 MG disintegrating tablet, Take 1 tablet (4 mg total) by mouth every 8 (eight) hours as needed for nausea or vomiting., Disp: 20 tablet, Rfl: 0 .  phentermine (ADIPEX-P) 37.5 MG tablet, Take 1 tablet (37.5 mg total) by mouth daily before breakfast., Disp: 30 tablet, Rfl: 2  EXAM:  VITALS per patient if applicable:  GENERAL: alert, oriented, appears well and in no acute distress  HEENT: atraumatic, conjunttiva clear, no obvious abnormalities on inspection of external nose and ears  NECK: normal movements of the head and neck  LUNGS: on inspection no signs of respiratory distress, breathing rate appears normal, no obvious gross SOB, gasping or wheezing  CV: no obvious cyanosis  MS: moves all visible extremities  without noticeable abnormality  PSYCH/NEURO: pleasant and cooperative, no obvious depression or anxiety, speech and thought processing grossly intact  ASSESSMENT AND PLAN:  Discussed the following assessment and plan:  No diagnosis found.  No problem-specific Assessment & Plan notes found for this encounter.    I discussed the assessment and treatment plan with the patient. The patient was provided an opportunity to ask questions and all were answered. The patient agreed with the plan and demonstrated  an understanding of the instructions.   The patient was advised to call back or seek an in-person evaluation if the symptoms worsen or if the condition fails to improve as anticipated.  I provided  25 minutes of non-face-to-face time during this encounter.   Sherlene Shamseresa L Sheylin Scharnhorst, MD

## 2018-12-21 DIAGNOSIS — M722 Plantar fascial fibromatosis: Secondary | ICD-10-CM | POA: Insufficient documentation

## 2018-12-21 NOTE — Assessment & Plan Note (Signed)
Managed by podiatry  Improving slowly

## 2018-12-21 NOTE — Assessment & Plan Note (Signed)
Hormonally mediated but also triggered by weather/barometric pressure changes.  Frequency once/month,  treated with NSAIDS and anti emetics and rest.  Requires FMLA for 8 hours per month .  Forms to be completed

## 2018-12-21 NOTE — Assessment & Plan Note (Signed)
Aggravated by current financial climate and risk of infection.  Continue celexa

## 2018-12-21 NOTE — Assessment & Plan Note (Signed)
She is maintaining her weight with Phentermine.  She is not having any  side effects and risks and understands that  The continued prescribing of phentermine despite a plateau in her weight is  being done due to current circumstances of the epidemic and sheltering at home.   Encouraged to exercise more with a goal of 5 days per week.

## 2018-12-21 NOTE — Progress Notes (Addendum)
Virtual Visit via [email or web address]  This visit type was conducted due to national recommendations for restrictions regarding the COVID-19 pandemic (e.g. social distancing).  This format is felt to be most appropriate for this patient at this time.  All issues noted in this document were discussed and addressed.  No physical exam was performed (except for noted visual exam findings with Video Visits).   I connected with@ on 12/20/18 at  3:00 PM EDT by a video enabled telemedicine application and  verified that I am speaking with the correct person using two identifiers. Location patient: home Location provider: work  Persons participating in the virtual visit: patient, provider  I discussed the limitations, risks, security and privacy concerns of performing an evaluation and management service by telephone and the availability of in person appointments. I also discussed with the patient that there may be a patient responsible charge related to this service. The patient expressed understanding and agreed to proceed.  Reason for visit: follow up on pharmacotherapy for overwieight, anxiety   HPI:  33 yr old female with history of obesity and GAD presents for 3 month follow up on pharmacotherapy for weight loss.    The patient is working from home for [employer].  She is sheltering at home.  She has no signs or symptoms of COVID 19 infection (fever, cough, sore throat  or shortness of breath beyond what is typical for patient).  Patient denies contact with other persons with the above mentioned symptoms or with anyone confirmed to have COVID 19   Not exercising due to foot pain. Seeing podiatry for plantar fasciitis .  Trying yoga which seems to be helping.  Gained a few lbs initially but has settled into a routine  Not buying the food that's off limits .  History of Migraine headaches,  Needs FMLA filled out . Has been averaging zero headaches ,  But spring and summer time are usually the problem seasons due  to temperature and barometric change,  Usually lasting One day in duration .  Max 2-3 days per month  Historically has not missed more than 2 days per 6 month period.  ROS: See pertinent positives and negatives per HPI.  Past Medical History:  Diagnosis Date  . Anxiety   . Hypertension     No past surgical history on file.  Family History  Problem Relation Age of Onset  . Ovarian cancer Mother   . Anxiety disorder Father   . Diabetes Father   . Hypertension Father   . Hyperlipidemia Father   . Cancer Paternal Grandmother [phone]       breast ca  . Diabetes Paternal Grandmother   . Anxiety disorder Maternal Grandmother     SOCIAL HX: single  Current Outpatient Medications:  .  ALPRAZolam (XANAX) 0.25 MG tablet, Take 1 tablet (0.25 mg total) by mouth 2 (two) times daily as needed for anxiety., Disp: 30 tablet, Rfl: 1 .  citalopram (CELEXA) 20 MG tablet, TAKE 1 TABLET BY MOUTH  DAILY, Disp: 90 tablet, Rfl: 1 .  diclofenac (VOLTAREN) 75 MG EC tablet, Take 1 tablet (75 mg total) by mouth 2 (two) times daily., Disp: 60 tablet, Rfl: 1 .  levonorgestrel (MIRENA) 20 MCG/24HR IUD, 1 each by Intrauterine route once., Disp: , Rfl:  .  ondansetron (ZOFRAN ODT) 4 MG disintegrating tablet, Take 1 tablet (4 mg total) by mouth every 8 (eight) hours as needed for nausea or vomiting., Disp: 20 tablet, Rfl: 0 .  phentermine (ADIPEX-P) 37.5  MG tablet, Take 1 tablet (37.5 mg total) by mouth daily before breakfast., Disp: 30 tablet, Rfl: 2  EXAM:  VITALS per patient if applicable:  GENERAL: alert, oriented, appears well and in no acute distress  HEENT: atraumatic, conjunttiva clear, no obvious abnormalities on inspection of external nose and ears  NECK: normal movements of the head and neck  LUNGS: on inspection no signs of respiratory distress, breathing rate appears normal, no obvious gross SOB, gasping or wheezing  CV: no obvious cyanosis  MS: moves all visible extremities without noticeable  abnormality  PSYCH/NEURO: pleasant and cooperative, no obvious depression or anxiety, speech and thought processing grossly intact  ASSESSMENT AND PLAN:  Discussed the following assessment and plan:  Menstrual migraine without status migrainosus, not intractable  Class 1 obesity due to excess calories without serious comorbidity with body mass index (BMI) of 32.0 to 32.9 in adult  Plantar fasciitis, bilateral  GAD (generalized anxiety disorder)  Migraine headache Hormonally mediated but also triggered by weather/barometric pressure changes.  Frequency once/month,  treated with NSAIDS and anti emetics and rest.  Requires FMLA for 8 hours per month .  Forms to be completed   Obesity She is maintaining her weight with Phentermine.  She is not having any  side effects and risks and understands that  The continued prescribing of phentermine despite a plateau in her weight is  being done due to current circumstances of the epidemic and sheltering at home.   Encouraged to exercise more with a goal of 5 days per week.   Plantar fasciitis, bilateral Managed by podiatry  Improving slowly   GAD (generalized anxiety disorder) Aggravated by current financial climate and risk of infection.  Continue celexa     I discussed the assessment and treatment plan with the patient. The patient was provided an opportunity to ask questions and all were answered. The patient agreed with the plan and demonstrated an understanding of the instructions.   The patient was advised to call back or seek an in-person evaluation if the symptoms worsen or if the condition fails to improve as anticipated.  I provided 25 minutes of non-face-to-face time during this encounter.   Sherlene Shams, MD

## 2018-12-22 ENCOUNTER — Telehealth: Payer: Self-pay

## 2018-12-22 NOTE — Telephone Encounter (Signed)
PA for Phentermine has been submitted on covermymeds.

## 2018-12-26 ENCOUNTER — Encounter: Payer: Self-pay | Admitting: Internal Medicine

## 2018-12-28 NOTE — Telephone Encounter (Signed)
PA has been denied.  

## 2019-02-25 ENCOUNTER — Other Ambulatory Visit: Payer: Self-pay | Admitting: Internal Medicine

## 2019-02-27 NOTE — Telephone Encounter (Signed)
Refilled: 03/18/2018 Last OV: 12/20/2018 Next OV: not scheduled

## 2019-06-01 ENCOUNTER — Other Ambulatory Visit: Payer: Self-pay | Admitting: Internal Medicine

## 2019-08-22 ENCOUNTER — Ambulatory Visit: Payer: Managed Care, Other (non HMO) | Attending: Internal Medicine

## 2019-08-22 DIAGNOSIS — Z20822 Contact with and (suspected) exposure to covid-19: Secondary | ICD-10-CM

## 2019-08-24 ENCOUNTER — Encounter: Payer: Self-pay | Admitting: *Deleted

## 2019-08-24 LAB — NOVEL CORONAVIRUS, NAA: SARS-CoV-2, NAA: DETECTED — AB

## 2019-08-28 ENCOUNTER — Other Ambulatory Visit: Payer: Self-pay

## 2019-08-28 ENCOUNTER — Other Ambulatory Visit: Payer: Self-pay | Admitting: Internal Medicine

## 2019-08-28 NOTE — Telephone Encounter (Signed)
Refilled: 06/10/2018 Last OV: 12/20/2018 Next OV: not scheduled

## 2019-09-22 NOTE — Telephone Encounter (Signed)
Left message for patient to return call to office needs appt to discuss ADHD

## 2019-09-25 ENCOUNTER — Other Ambulatory Visit: Payer: Self-pay

## 2019-09-25 ENCOUNTER — Ambulatory Visit (INDEPENDENT_AMBULATORY_CARE_PROVIDER_SITE_OTHER): Payer: Managed Care, Other (non HMO) | Admitting: Internal Medicine

## 2019-09-25 VITALS — Ht 66.0 in | Wt 245.0 lb

## 2019-09-25 DIAGNOSIS — E6609 Other obesity due to excess calories: Secondary | ICD-10-CM

## 2019-09-25 DIAGNOSIS — R41840 Attention and concentration deficit: Secondary | ICD-10-CM

## 2019-09-25 DIAGNOSIS — Z6832 Body mass index (BMI) 32.0-32.9, adult: Secondary | ICD-10-CM

## 2019-09-25 DIAGNOSIS — F411 Generalized anxiety disorder: Secondary | ICD-10-CM | POA: Diagnosis not present

## 2019-09-25 MED ORDER — BUPROPION HCL ER (XL) 150 MG PO TB24: 150.0000 mg | ORAL_TABLET | Freq: Every day | ORAL | 0 refills | Status: DC

## 2019-09-25 NOTE — Progress Notes (Signed)
Virtual Visit via [email or web address]  This visit type was conducted due to national recommendations for restrictions regarding the COVID-19 pandemic (e.g. social distancing).  This format is felt to be most appropriate for this patient at this time.  All issues noted in this document were discussed and addressed.  No physical exam was performed (except for noted visual exam findings with Video Visits).   I connected with@ on 09/25/19 at 10:30 AM EST by a video enabled telemedicine application  and verified that I am speaking with the correct person using two identifiers. Location patient: home Location provider: work or home office Persons participating in the virtual visit: patient, provider  I discussed the limitations, risks, security and privacy concerns of performing an evaluation and management service by telephone and the availability of in person appointments. I also discussed with the patient that there may be a patient responsible charge related to this service. The patient expressed understanding and agreed to proceed.   Reason for visit:  1)Weight gain 2) disturbed concentration  HPI:  34 yr old full time employee of [employer] presents with concentration distrubance.  Trouble concentrating on work,  Class  Taking a 3 hour graduate level course in person at [place],  [employer] concentrate for more than 5 minutes.  Restless.  Losing phone multiple times daily.  Denies sleepiness,  Trouble sleeping.  No anxiety or panic attacks.    Obesity:  Weight gain of 70 lbs since reaching a nadir of 177 in 2017.  Buys health food but also buys junk food and not eating the healthy food.  Appetite increased.  Exercising for 30 miutes at [employer] 3 days per seek   ROS: See pertinent positives and negatives per HPI.  Past Medical History:  Diagnosis Date  . Anxiety   . Hx of dysplastic nevus 2012   Left medial buttock   . Hypertension     No past surgical history on file.  Family History  Problem Relation Age of  Onset  . Ovarian cancer Mother   . Anxiety disorder Father   . Diabetes Father   . Hypertension Father   . Hyperlipidemia Father   . Cancer Paternal Grandmother 60       breast ca  . Diabetes Paternal Grandmother   . Anxiety disorder Maternal Grandmother     SOCIAL HX:  reports that she has never smoked. She has never used smokeless tobacco. She reports current alcohol use. She reports that she does not use drugs.   Current Outpatient Medications:  .  ALPRAZolam (XANAX) 0.25 MG tablet, TAKE 1 TABLET BY MOUTH TWICE DAILY AS NEEDED FOR ANXIETY, Disp: 30 tablet, Rfl: 5 .  citalopram (CELEXA) 20 MG tablet, TAKE 1 TABLET BY MOUTH  DAILY, Disp: 90 tablet, Rfl: 3 .  levonorgestrel (MIRENA) 20 MCG/24HR IUD, 1 each by Intrauterine route once., Disp: , Rfl:  .  ondansetron (ZOFRAN-ODT) 4 MG disintegrating tablet, DISSOLVE 1 TABLET BY MOUTH EVERY 8 HOURS AS NEEDED FOR NAUSEA OR VOMITING, Disp: 20 tablet, Rfl: 0 .  buPROPion (WELLBUTRIN XL) 150 MG 24 hr tablet, Take 1 tablet (150 mg total) by mouth daily., Disp: 90 tablet, Rfl: 0  EXAM:  VITALS per patient if applicable:  GENERAL: alert, oriented, appears well and in no acute distress  HEENT: atraumatic, conjunttiva clear, no obvious abnormalities on inspection of external nose and ears  NECK: normal movements of the head and neck  LUNGS: on inspection no signs of respiratory distress, breathing rate appears normal, no obvious  gross SOB, gasping or wheezing  CV: no obvious cyanosis  MS: moves all visible extremities without noticeable abnormality  PSYCH/NEURO: pleasant and cooperative, no obvious depression or anxiety, speech and thought processing grossly intact.  Fidgeting constantly durig 30 mintue interview   ASSESSMENT AND PLAN:  Discussed the following assessment and plan:  Disturbed concentration - Plan: Ambulatory referral to Psychology  GAD (generalized anxiety disorder)  Class 1 obesity due to excess calories without  serious comorbidity with body mass index (BMI) of 32.0 to 32.9 in adult  Disturbed concentration Starting wellbutrin XL 150 mg daily,  Referral to [facility] for ADD testing.  Encouraged to increase physical activity to 5 days per week   GAD (generalized anxiety disorder) She feels that her symptoms are well controlled  By ongoing use of citalopram and denies any current feelings of untreated anxiety.   Continue celexa   Obesity She has only been able to maintain her weight with Phentermine, which was stopped due to platueaing.  Trial of wellbutrin.  Recommending use of Weight Watchers program or Noom  and encouraged to exercise more with a goal of 5 days per week.     I discussed the assessment and treatment plan with the patient. The patient was provided an opportunity to ask questions and all were answered. The patient agreed with the plan and demonstrated an understanding of the instructions.   The patient was advised to call back or seek an in-person evaluation if the symptoms worsen or if the condition fails to improve as anticipated.  I provided 30 minutes of non-face-to-face time during this encounter.   Crecencio Mc, MD

## 2019-09-25 NOTE — Assessment & Plan Note (Signed)
She has only been able to maintain her weight with Phentermine, which was stopped due to platueaing.  Trial of wellbutrin.  Recommending use of Weight Watchers program or Noom  and encouraged to exercise more with a goal of 5 days per week.

## 2019-09-25 NOTE — Telephone Encounter (Signed)
Pt was seen by Dr. Darrick Huntsman today to discuss.

## 2019-09-25 NOTE — Assessment & Plan Note (Signed)
She feels that her symptoms are well controlled  By ongoing use of citalopram and denies any current feelings of untreated anxiety.   Continue celexa

## 2019-09-25 NOTE — Assessment & Plan Note (Signed)
Starting wellbutrin XL 150 mg daily,  Referral to [facility] for ADD testing.  Encouraged to increase physical activity to 5 days per week

## 2019-10-29 ENCOUNTER — Ambulatory Visit: Payer: Managed Care, Other (non HMO) | Attending: Internal Medicine

## 2019-10-29 DIAGNOSIS — Z23 Encounter for immunization: Secondary | ICD-10-CM | POA: Insufficient documentation

## 2019-10-29 NOTE — Progress Notes (Signed)
   Covid-19 Vaccination Clinic  Name:  Cynthia Lewis    MRN: [id number] DOB: 04-03-1986  10/29/2019  [name] was observed post Covid-19 immunization for 15 minutes without incident. She was provided with Vaccine Information Sheet and instruction to access the V-Safe system.   [name] was instructed to call 911 with any severe reactions post vaccine: Marland Kitchen Difficulty breathing  . Swelling of face and throat  . A fast heartbeat  . A bad rash all over body  . Dizziness and weakness   Immunizations Administered    Name Date Dose VIS Date Route   Pfizer COVID-19 Vaccine 10/29/2019  8:36 AM 0.3 mL 08/04/2019 Intramuscular   Manufacturer: [employer], [employer]   Lot: [id number]   NDC: [address]-0

## 2019-11-16 ENCOUNTER — Ambulatory Visit (INDEPENDENT_AMBULATORY_CARE_PROVIDER_SITE_OTHER): Payer: 59 | Admitting: Psychology

## 2019-11-16 DIAGNOSIS — F411 Generalized anxiety disorder: Secondary | ICD-10-CM | POA: Diagnosis not present

## 2019-11-20 ENCOUNTER — Ambulatory Visit: Payer: Managed Care, Other (non HMO) | Attending: Internal Medicine

## 2019-11-20 DIAGNOSIS — Z23 Encounter for immunization: Secondary | ICD-10-CM

## 2019-11-20 NOTE — Progress Notes (Signed)
   Covid-19 Vaccination Clinic  Name:  DARNEISHA WINDHORST    MRN: [id number] DOB: 04-Dec-1985  11/20/2019  Ms. Husband was observed post Covid-19 immunization for 15 minutes without incident. She was provided with Vaccine Information Sheet and instruction to access the V-Safe system.   Ms. Weisheit was instructed to call 911 with any severe reactions post vaccine: Marland Kitchen Difficulty breathing  . Swelling of face and throat  . A fast heartbeat  . A bad rash all over body  . Dizziness and weakness   Immunizations Administered    Name Date Dose VIS Date Route   Pfizer COVID-19 Vaccine 11/20/2019  8:15 AM 0.3 mL 08/04/2019 Intramuscular   Manufacturer: [employer], [employer]   Lot: [id number]   NDC: [address]-9

## 2019-11-24 ENCOUNTER — Ambulatory Visit: Payer: 59 | Admitting: Psychology

## 2019-12-29 ENCOUNTER — Other Ambulatory Visit: Payer: Self-pay

## 2019-12-29 MED ORDER — BUPROPION HCL ER (XL) 150 MG PO TB24: 150.0000 mg | ORAL_TABLET | Freq: Every day | ORAL | 1 refills | Status: DC

## 2020-01-02 ENCOUNTER — Ambulatory Visit (INDEPENDENT_AMBULATORY_CARE_PROVIDER_SITE_OTHER): Payer: Managed Care, Other (non HMO)

## 2020-01-02 ENCOUNTER — Ambulatory Visit (INDEPENDENT_AMBULATORY_CARE_PROVIDER_SITE_OTHER): Payer: Managed Care, Other (non HMO) | Admitting: Podiatry

## 2020-01-02 ENCOUNTER — Other Ambulatory Visit: Payer: Self-pay

## 2020-01-02 DIAGNOSIS — M722 Plantar fascial fibromatosis: Secondary | ICD-10-CM | POA: Diagnosis not present

## 2020-01-02 MED ORDER — DICLOFENAC SODIUM 75 MG PO TBEC
75.0000 mg | DELAYED_RELEASE_TABLET | Freq: Two times a day (BID) | ORAL | 1 refills | Status: DC
Start: 2020-01-02 — End: 2020-09-04

## 2020-01-02 MED ORDER — METHYLPREDNISOLONE 4 MG PO TBPK: ORAL_TABLET | ORAL | 0 refills | Status: DC

## 2020-01-05 NOTE — Progress Notes (Signed)
   Subjective: 34 year old female presenting today for follow up evaluation of bilateral plantar fasciitis. She states she is currently having a flare up of stabbing, sharp, burning pain of the heels that began about a month ago. She has been using orthotics with minimal relief. She reports some relief in the past with injections. Walking and being on her feet for long periods of time increases the pain. Patient is here for further evaluation and treatment.   Past Medical History:  Diagnosis Date  . Anxiety   . Hx of dysplastic nevus 2012   Left medial buttock   . Hypertension      Objective: Physical Exam General: The patient is alert and oriented x3 in no acute distress.  Dermatology: Skin is warm, dry and supple bilateral lower extremities. Negative for open lesions or macerations bilateral.   Vascular: Dorsalis Pedis and Posterior Tibial pulses palpable bilateral.  Capillary fill time is immediate to all digits.  Neurological: Epicritic and protective threshold intact bilateral.   Musculoskeletal: Tenderness to palpation at the medial calcaneal tubercale and through the insertion of the plantar fascia of the bilateral feet. All other joints range of motion within normal limits bilateral. Strength 5/5 in all groups bilateral.   Radiographic Exam:  Normal osseous mineralization. Joint spaces preserved. No fracture/dislocation/boney destruction.    Assessment: 1. plantar fasciitis bilateral feet   Plan of Care:  1. Patient evaluated. X-Rays reviewed.  2. Injection of 0.5cc Celestone soluspan injected into the bilateral heels.  3. Prescription for Medrol Dose Pak provided to patient. 4. Refill prescription for Diclofenac provided to patient.  5. Continue using custom orthotics.  6. Return to clinic as needed.    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   Felecia Shelling, DPM [facility]  Dr. Felecia Shelling, DPM    [address] [place], [place] [address]                Office [phone]  Fax [phone]

## 2020-02-05 ENCOUNTER — Ambulatory Visit: Payer: 59 | Admitting: Psychology

## 2020-02-09 DIAGNOSIS — F411 Generalized anxiety disorder: Secondary | ICD-10-CM

## 2020-02-09 DIAGNOSIS — F900 Attention-deficit hyperactivity disorder, predominantly inattentive type: Secondary | ICD-10-CM

## 2020-02-16 ENCOUNTER — Telehealth: Payer: Self-pay

## 2020-02-16 ENCOUNTER — Other Ambulatory Visit: Payer: Self-pay

## 2020-02-16 MED ORDER — ALPRAZOLAM 0.25 MG PO TABS: 0.2500 mg | ORAL_TABLET | Freq: Two times a day (BID) | ORAL | 5 refills | Status: DC | PRN

## 2020-02-16 NOTE — Telephone Encounter (Signed)
mychart message sent in regards to phentermine refill request.

## 2020-02-16 NOTE — Telephone Encounter (Signed)
Refilled: 02/27/2019 Last OV: 09/25/2019 Next OV: not scheduled

## 2020-02-28 ENCOUNTER — Encounter: Payer: Self-pay | Admitting: Internal Medicine

## 2020-02-28 ENCOUNTER — Other Ambulatory Visit: Payer: Self-pay

## 2020-02-28 ENCOUNTER — Ambulatory Visit (INDEPENDENT_AMBULATORY_CARE_PROVIDER_SITE_OTHER): Payer: Managed Care, Other (non HMO) | Admitting: Internal Medicine

## 2020-02-28 DIAGNOSIS — F411 Generalized anxiety disorder: Secondary | ICD-10-CM | POA: Diagnosis not present

## 2020-02-28 DIAGNOSIS — F909 Attention-deficit hyperactivity disorder, unspecified type: Secondary | ICD-10-CM

## 2020-02-28 MED ORDER — LISDEXAMFETAMINE DIMESYLATE 20 MG PO CAPS
20.0000 mg | ORAL_CAPSULE | Freq: Every day | ORAL | 0 refills | Status: DC
Start: 2020-02-28 — End: 2020-03-25

## 2020-02-28 MED ORDER — CITALOPRAM HYDROBROMIDE 20 MG PO TABS: 20.0000 mg | ORAL_TABLET | Freq: Every day | ORAL | 3 refills | Status: DC

## 2020-02-28 NOTE — Patient Instructions (Addendum)
Trial of Vyvanse  Starting at 20 mg daily.  Prior authorization may be required, just let us know    Lisdexamfetamine Oral Capsule What is this medicine? LISDEXAMFETAMINE (lis DEX am fet a meen) is used to treat attention-deficit hyperactivity disorder (ADHD) in adults and children. It is also used to treat binge-eating disorder in adults. Federal law prohibits giving this medicine to any person other than the person for whom it was prescribed. Do not share this medicine with anyone else. This medicine may be used for other purposes; ask your health care provider or pharmacist if you have questions. COMMON BRAND NAME(S): Vyvanse What should I tell my health care provider before I take this medicine? They need to know if you have any of these conditions:  anxiety or panic attacks  circulation problems in fingers and toes  glaucoma  hardening or blockages of the arteries or heart blood vessels  heart disease or a heart defect  high blood pressure  history of a drug or alcohol abuse problem  history of stroke  kidney disease  liver disease  mental illness  seizures  suicidal thoughts, plans, or attempt; a previous suicide attempt by you or a family member  thyroid disease  Tourette's syndrome  an unusual or allergic reaction to lisdexamfetamine, other medicines, foods, dyes, or preservatives  pregnant or trying to get pregnant  breast-feeding How should I use this medicine? Take this medicine by mouth. Follow the directions on the prescription label. Swallow the capsules with a drink of water. You may open capsule and add to a glass of water, then drink right away. Take your doses at regular intervals. Do not take your medicine more often than directed. Do not suddenly stop your medicine. You must gradually reduce the dose or you may feel withdrawal effects. Ask your doctor or health care professional for advice. A special MedGuide will be given to you by the pharmacist  with each prescription and refill. Be sure to read this information carefully each time. Talk to your pediatrician regarding the use of this medicine in children. While this drug may be prescribed for children as young as 69 years of age for selected conditions, precautions do apply. Overdosage: If you think you have taken too much of this medicine contact a poison control center or emergency room at once. NOTE: This medicine is only for you. Do not share this medicine with others. What if I miss a dose? If you miss a dose, take it as soon as you can. If it is almost time for your next dose, take only that dose. Do not take double or extra doses. What may interact with this medicine? Do not take this medicine with any of the following medications:  MAOIs like Carbex, Eldepryl, Marplan, Nardil, and Parnate  other stimulant medicines for attention disorders, weight loss, or to stay awake This medicine may also interact with the following medications:  acetazolamide  ammonium chloride  antacids  ascorbic acid  atomoxetine  caffeine  certain medicines for blood pressure  certain medicines for depression, anxiety, or psychotic disturbances  certain medicines for seizures like carbamazepine, phenobarbital, phenytoin  certain medicines for stomach problems like cimetidine, famotidine, omeprazole, lansoprazole  cold or allergy medicines  green tea  levodopa  linezolid  medicines for sleep during surgery  methenamine  norepinephrine  phenothiazines like chlorpromazine, mesoridazine, prochlorperazine, thioridazine  propoxyphene  sodium acid phosphate  sodium bicarbonate This list may not describe all possible interactions. Give your health care provider  a list of all the medicines, herbs, non-prescription drugs, or dietary supplements you use. Also tell them if you smoke, drink alcohol, or use illegal drugs. Some items may interact with your medicine. What should I watch  for while using this medicine? Visit your doctor for regular check ups. This prescription requires that you follow special procedures with your doctor and pharmacy. You will need to have a new written prescription from your doctor every time you need a refill. This medicine may affect your concentration, or hide signs of tiredness. Until you know how this medicine affects you, do not drive, ride a bicycle, use machinery, or do anything that needs mental alertness. Tell your doctor or health care professional if this medicine loses its effects, or if you feel you need to take more than the prescribed amount. Do not change your dose without talking to your doctor or health care professional. Decreased appetite is a common side effect when starting this medicine. Eating small, frequent meals or snacks can help. Talk to your doctor if you continue to have poor eating habits. Height and weight growth of a child taking this medicine will be monitored closely. Do not take this medicine close to bedtime. It may prevent you from sleeping. If you are going to need surgery, a MRI, CT scan, or other procedure, tell your doctor that you are taking this medicine. You may need to stop taking this medicine before the procedure. Tell your doctor or healthcare professional right away if you notice unexplained wounds on your fingers and toes while taking this medicine. You should also tell your healthcare provider if you experience numbness or pain, changes in the skin color, or sensitivity to temperature in your fingers or toes. What side effects may I notice from receiving this medicine? Side effects that you should report to your doctor or health care professional as soon as possible:  allergic reactions like skin rash, itching or hives, swelling of the face, lips, or tongue  changes in vision  chest pain or chest tightness  confusion, trouble speaking or understanding  fast, irregular heartbeat  fingers or  toes feel numb, cool, painful  hallucination, loss of contact with reality  high blood pressure  males: prolonged or painful erection  seizures  severe headaches  shortness of breath  suicidal thoughts or other mood changes  trouble walking, dizziness, loss of balance or coordination  uncontrollable head, mouth, neck, arm, or leg movements Side effects that usually do not require medical attention (report to your doctor or health care professional if they continue or are bothersome):  anxious  headache  loss of appetite  nausea, vomiting  trouble sleeping  weight loss This list may not describe all possible side effects. Call your doctor for medical advice about side effects. You may report side effects to FDA at 1-800-FDA-1088. Where should I keep my medicine? Keep out of the reach of children. This medicine can be abused. Keep your medicine in a safe place to protect it from theft. Do not share this medicine with anyone. Selling or giving away this medicine is dangerous and against the law. Store at room temperature between 15 and 30 degrees C (59 and 86 degrees F). Protect from light. Keep container tightly closed. Throw away any unused medicine after the expiration date. NOTE: This sheet is a summary. It may not cover all possible information. If you have questions about this medicine, talk to your doctor, pharmacist, or health care provider.  2020 Elsevier/Gold Standard (  2014-06-13 19:20:14) Cognitive Behavioral Therapy Cognitive behavioral therapy (CBT) is a short-term, goal-oriented type of talk therapy. CBT can help you:  Identify patterns of thinking, feeling, and behaving that are causing you problems.  Decide how you want to think, feel, and respond to life events.  Set goals to change the beliefs and thoughts that cause you to act in ways that are not helpful for you.  Follow up on the changes that you make. What are the different types of CBT? The  different types of CBT include:  Dialectical behavioral therapy (DBT). This approach is often used in group therapy, and it aids a person in managing behavior by focusing on: ? Things that cause problems to start (triggers). ? Methods of self-calming. ? Re-evaluating thinking processes.  Mindfulness-based cognitive therapy. This approach involves focusing your attention, meditating, and developing awareness of the present moment (mindfulness).  Rational emotive behavior therapy. This approach uses rational thought to reframe your thinking so it is less judgmental. Your therapist may directly challenge your thought processes.  Stress inoculation training. This approach involves planning ahead for stressful situations by practicing new thoughts and behaviors. This planning can help you avoid going back to old actions.  Acceptance and commitment therapy (ACT). This approach focuses on accepting yourself as you are and practicing mindfulness. It helps you understand what you would like to change and how you can set goals in that direction. What conditions is CBT used to treat? CBT may help to treat:  Mental health conditions, including: ? Depression. ? Anxiety. ? Bipolar disorder. ? Eating disorders. ? Post-traumatic stress disorder (PTSD). ? Obsessive-compulsive disorder (OCD).  Insomnia and other sleep disorders.  Pain.  Stress.  Coping with loss or grief.  Coping with a difficult medical diagnosis or illness.  Relationship problems.  Emotional distress or shock (trauma). How can CBT help me? CBT may:  Give you a chance to share your thoughts, feelings, problems, and fears in a safe space.  Help you focus on specific problems.  Give you homework that helps you put theory into practice. Homework may include keeping a journal or doing thinking exercises.  Help you become aware of your patterns of thinking, feeling, and behaving, and how those three patterns affect each  other.  Change your thoughts so that you can change your behaviors.  Help you chose how you want to view the world.  Teach you planned coping skills and offer better ways to deal with stress and difficult situations. To make the most of CBT, make sure you:  Find a licensed therapist whom you trust.  Take an active part in your therapy and do the homework that you are given.  Are honest about your problems.  Avoid skipping your therapy sessions. Summary  Cognitive behavioral therapy (CBT) is a short-term, goal-oriented type of talk therapy.  CBT can help you become aware of your patterns and the relationships among your thoughts, feelings, and behavior.  CBT may help mental health conditions and other problems. This information is not intended to replace advice given to you by your health care provider. Make sure you discuss any questions you have with your health care provider. Document Revised: 05/03/2019 Document Reviewed: 12/22/2016 Elsevier Patient Education  2020 [employer].

## 2020-02-28 NOTE — Progress Notes (Signed)
Subjective:  Patient ID: Cynthia Lewis, female    DOB: 26-Aug-1985  Age: 34 y.o. MRN: [id number]  CC: Diagnoses of GAD (generalized anxiety disorder) and Adult ADHD were pertinent to this visit.  HPI Cynthia Lewis presents for follow up on recent testing for ADHD .  Results have been reviewed with patient .    This visit occurred during the SARS-CoV-2 public health emergency.  Safety protocols were in place, including screening questions prior to the visit, additional usage of staff PPE, and extensive cleaning of exam room while observing appropriate contact time as indicated for disinfecting solutions.   She has had considerable anxiety regarding her inability to perform at her potential because of her attention deficit disorder    She has developed insomnia from working late at night on laptop pursuing a Masters Degree in [employer] and working full time for [employer] .  She is dismayed that she received her first C in accounting class.         Outpatient Medications Prior to Visit  Medication Sig Dispense Refill   ALPRAZolam (XANAX) 0.25 MG tablet Take 1 tablet (0.25 mg total) by mouth 2 (two) times daily as needed. for anxiety 30 tablet 5   diclofenac (VOLTAREN) 75 MG EC tablet Take 1 tablet (75 mg total) by mouth 2 (two) times daily. 60 tablet 1   levonorgestrel (MIRENA) 20 MCG/24HR IUD 1 each by Intrauterine route once.     methylPREDNISolone (MEDROL DOSEPAK) 4 MG TBPK tablet 6 day dose pack - take as directed 21 tablet 0   ondansetron (ZOFRAN-ODT) 4 MG disintegrating tablet DISSOLVE 1 TABLET BY MOUTH EVERY 8 HOURS AS NEEDED FOR NAUSEA OR VOMITING 20 tablet 0   buPROPion (WELLBUTRIN XL) 150 MG 24 hr tablet Take 1 tablet (150 mg total) by mouth daily. 90 tablet 1   citalopram (CELEXA) 20 MG tablet TAKE 1 TABLET BY MOUTH  DAILY 90 tablet 3   No facility-administered medications prior to visit.    Review of Systems;  Patient denies headache, fevers,  malaise, unintentional weight loss, skin rash, eye pain, sinus congestion and sinus pain, sore throat, dysphagia,  hemoptysis , cough, dyspnea, wheezing, chest pain, palpitations, orthopnea, edema, abdominal pain, nausea, melena, diarrhea, constipation, flank pain, dysuria, hematuria, urinary  Frequency, nocturia, numbness, tingling, seizures,  Focal weakness, Loss of consciousness,  Tremor,  Depression,  and suicidal ideation.      Objective:  BP 118/90 (BP Location: Left Arm, Patient Position: Sitting, Cuff Size: Large)    Pulse 90    Temp 98.4 F (36.9 C) (Temporal)    Resp 15    Ht 5\' 6"  (1.676 m)    Wt 277 lb 3.2 oz (125.7 kg)    SpO2 98%    BMI 44.74 kg/m   BP Readings from Last 3 Encounters:  02/28/20 118/90  09/14/18 106/72  06/10/18 122/84    Wt Readings from Last 3 Encounters:  02/28/20 277 lb 3.2 oz (125.7 kg)  09/25/19 245 lb (111.1 kg)  09/14/18 215 lb (97.5 kg)    General appearance: alert, cooperative and appears stated age Ears: normal TM's and external ear canals both ears Throat: lips, mucosa, and tongue normal; teeth and gums normal Neck: no adenopathy, no carotid bruit, supple, symmetrical, trachea midline and thyroid not enlarged, symmetric, no tenderness/mass/nodules Back: symmetric, no curvature. ROM normal. No CVA tenderness. Lungs: clear to auscultation bilaterally Heart: regular rate and rhythm, S1, S2 normal, no murmur, click, rub or  gallop Abdomen: soft, non-tender; bowel sounds normal; no masses,  no organomegaly Pulses: 2+ and symmetric Skin: Skin color, texture, turgor normal. No rashes or lesions Lymph nodes: Cervical, supraclavicular, and axillary nodes normal.  Lab Results  Component Value Date   HGBA1C 5.2 03/18/2018   HGBA1C 5.2 03/26/2017   HGBA1C 5.3 03/15/2017    Lab Results  Component Value Date   CREATININE 1.05 (H) 12/02/2018   CREATININE 0.98 03/18/2018   CREATININE 0.93 03/26/2017    Lab Results  Component Value Date   WBC  8.4 03/18/2018   HGB 11.5 03/18/2018   HCT 38.1 03/18/2018   PLT 349 03/18/2018   GLUCOSE 70 12/02/2018   CHOL 167 03/18/2018   TRIG 148 03/18/2018   HDL 66 03/18/2018   LDLCALC 71 03/18/2018   ALT 11 12/02/2018   AST 14 12/02/2018   NA 142 12/02/2018   K 4.3 12/02/2018   CL 102 12/02/2018   CREATININE 1.05 (H) 12/02/2018   BUN 13 12/02/2018   CO2 24 12/02/2018   TSH 2.870 03/18/2018   HGBA1C 5.2 03/18/2018    No results found.  Assessment & Plan:   Problem List Items Addressed This Visit      Unprioritized   GAD (generalized anxiety disorder)    She feels that her symptoms will be better  controlled with treatment of her ADD and continued use of citalopram .   Continue celexa       Relevant Medications   citalopram (CELEXA) 20 MG tablet   Adult ADHD    Diagnosed with formal testing June 2021.  Trial of Vyvanse        .      I have discontinued Falen Lehrmann. Mazurowski's buPROPion. I have also changed her citalopram. Additionally, I am having her start on lisdexamfetamine. Lastly, I am having her maintain her levonorgestrel, ondansetron, methylPREDNISolone, diclofenac, and ALPRAZolam.  Meds ordered this encounter  Medications   lisdexamfetamine (VYVANSE) 20 MG capsule    Sig: Take 1 capsule (20 mg total) by mouth daily.    Dispense:  30 capsule    Refill:  0   citalopram (CELEXA) 20 MG tablet    Sig: Take 1 tablet (20 mg total) by mouth daily.    Dispense:  90 tablet    Refill:  3    KEEP ON FILE FOR FUTURE REFILLS    Medications Discontinued During This Encounter  Medication Reason   citalopram (CELEXA) 20 MG tablet    buPROPion (WELLBUTRIN XL) 150 MG 24 hr tablet     Follow-up: Return in about 6 months (around 08/30/2020).   Sherlene Shams, MD

## 2020-02-29 ENCOUNTER — Telehealth: Payer: Self-pay | Admitting: Internal Medicine

## 2020-02-29 NOTE — Telephone Encounter (Signed)
LMTCB

## 2020-02-29 NOTE — Telephone Encounter (Signed)
Pt needs a PA on lisdexamfetamine (VYVANSE) 20 MG capsule

## 2020-02-29 NOTE — Telephone Encounter (Signed)
PA has been submitted on covermymeds.  

## 2020-03-01 ENCOUNTER — Encounter: Payer: Self-pay | Admitting: Internal Medicine

## 2020-03-01 DIAGNOSIS — F909 Attention-deficit hyperactivity disorder, unspecified type: Secondary | ICD-10-CM | POA: Insufficient documentation

## 2020-03-01 NOTE — Assessment & Plan Note (Addendum)
Diagnosed with formal testing June 2021 with decreased executive function.  Trial of Vyvanse  Recommended along with CBT ,  She is contemplative of CBT but does not want to pursue it at this time due to cost and time constraints.

## 2020-03-01 NOTE — Assessment & Plan Note (Signed)
She feels that her symptoms will be better  controlled with treatment of her ADD and continued use of citalopram .   Continue celexa

## 2020-03-05 ENCOUNTER — Telehealth: Payer: Self-pay | Admitting: Internal Medicine

## 2020-03-05 NOTE — Telephone Encounter (Signed)
Patient called to let Dr. Darrick Huntsman know that her pharmacy, [facility] cancelled her lisdexamfetamine (VYVANSE) 20 MG capsule. She is trying to find out way but can not get through to her Pharmacy. She said she thought the prior authorization went through.

## 2020-03-05 NOTE — Telephone Encounter (Signed)
Spoke with pt to let her know that the PA was submitted again this morning. Pt gave a verbal understanding.

## 2020-03-07 NOTE — Telephone Encounter (Signed)
PA has been approved and pt is aware.  

## 2020-03-12 NOTE — Telephone Encounter (Signed)
Pt called stated that her lisdexamfetamine (VYVANSE) 20 MG capsule needs to be raised to a higher amount not getting results

## 2020-03-12 NOTE — Telephone Encounter (Signed)
Vyvanse comes in 20 mg 30 mg and 40 mg doses.  Since Cynthia Lewis still has 2 weeks left I would like her to try taking 2 capsules daily (40 mg total) for the next week;  If Cynthia Lewis feels this is too strong,  We will reduce the dose to 30 mg with a new rx

## 2020-03-12 NOTE — Telephone Encounter (Signed)
Spoke with pt and advised her that Dr. Darrick Huntsman would like for her to increase to 40 mg daily for the next week and see how that works for her. I let her know that if it was too strong to give [place] a call back and she would send in a rx for the 30 mg dose. Pt gave a verbal understanding.

## 2020-03-25 ENCOUNTER — Telehealth: Payer: Self-pay

## 2020-03-25 MED ORDER — LISDEXAMFETAMINE DIMESYLATE 40 MG PO CAPS
40.0000 mg | ORAL_CAPSULE | Freq: Every day | ORAL | 0 refills | Status: DC
Start: 2020-03-25 — End: 2020-04-30

## 2020-03-25 NOTE — Addendum Note (Signed)
Addended by: Sherlene Shams on: 03/25/2020 05:25 PM   Modules accepted: Orders

## 2020-03-25 NOTE — Telephone Encounter (Signed)
-  Caller states she needs a refill on Vyvanse 40 mg prescription. Caller states she ran out this morning. No symptoms. Was able to take last two and has 20 mg left.     [facility] - [place] TELEPHONE ADVICE RECORD [facility] Patient Name: Cynthia Lewis Gender: Female DOB: 01-Jun-1986 Age: 34 Y 1 M 25 D Return Phone Number: [phone] (Primary) Address: City/State/Zip: [place] [address] Client [facility] - [place] Client Site [facility] - Day Physician Duncan Dull - MD Contact Type Call Who Is Calling Patient / Member / Family / Caregiver Call Type Triage / Clinical Relationship To Patient Self Return Phone Number [phone] (Primary) Chief Complaint Prescription Refill or Medication Request (non symptomatic) Reason for Call Medication Question / Request Initial Comment Caller states she needs a refill on Vyvanse 40 mg prescription. Translation No Nurse Assessment Nurse: Saddie Benders, RN, Claudette Date/Time (Eastern Time): 03/23/2020 9:08:25 AM Confirm and document reason for call. If symptomatic, describe symptoms. ---Caller states she needs a refill on Vyvanse 40 mg prescription. Caller states she ran out this morning. No symptoms. Was able to take last two and has 20 mg left. Has the patient had close contact with a person known or suspected to have the novel coronavirus illness OR traveled / lives in area with major community spread (including international travel) in the last 14 days from the onset of symptoms? * If Asymptomatic, screen for exposure and travel within the last 14 days. ---No Does the patient have any new or worsening symptoms? ---Yes Will a triage be completed? ---Yes Related visit to physician within the last 2 weeks? ---No Does the PT have any chronic conditions? (i.e. diabetes, asthma, this includes High risk factors for pregnancy, etc.) ---Yes List  chronic conditions. ---ADHD Is the patient pregnant or possibly pregnant? (Ask all females between the ages of 69-55) ---No Is this a behavioral health or substance abuse call? ---No Nurse: Saddie Benders, RN, Claudette Date/Time (Eastern Time): 03/23/2020 9:11:41 AM Please select the assessment type ---Request for controlled medication refill Additional Documentation ---Vyvanse 40 mg once a day Is there an on-call physician for the client? ---No PLEASE NOTE: All timestamps contained within this report are represented as [place] Standard Time. CONFIDENTIALTY NOTICE: This fax transmission is intended only for the addressee. It contains information that is legally privileged, confidential or otherwise protected from use or disclosure. If you are not the intended recipient, you are strictly prohibited from reviewing, disclosing, copying using or disseminating any of this information or taking any action in reliance on or regarding this information. If you have received this fax in error, please notify us immediately by telephone so that we can arrange for its return to [place]. Phone: [phone], Toll-Free: [phone], Fax: [phone] Page: 2 of 2 Call Id: [id number] Disp. Time Lamount Cohen Time) Disposition Final User 03/23/2020 9:13:05 AM Clinical Call Yes Saddie Benders, RN, Claudette Comments User: Burley Saver, RN Date/Time Lamount Cohen Time): 03/23/2020 9:12:57 AM Explained to caller that controlled substances can not be called in after hours. She has 20 mg left of her medication for today and she will try to obtain a loaner dose from the pharmacy if possible.

## 2020-04-30 ENCOUNTER — Telehealth: Payer: Self-pay | Admitting: Internal Medicine

## 2020-04-30 MED ORDER — LISDEXAMFETAMINE DIMESYLATE 40 MG PO CAPS: 40.0000 mg | ORAL_CAPSULE | Freq: Every day | ORAL | 0 refills | Status: DC

## 2020-04-30 NOTE — Telephone Encounter (Signed)
Refill request for vyvanse, last seen 02-28-20, last filled 03-25-20.  Please advise.

## 2020-04-30 NOTE — Telephone Encounter (Signed)
Refilled for 3 months.

## 2020-04-30 NOTE — Telephone Encounter (Signed)
Pt needs refill on lisdexamfetamine (VYVANSE) 40 MG capsule sent to the [facility] on [address]

## 2020-05-28 ENCOUNTER — Other Ambulatory Visit: Payer: Self-pay | Admitting: Internal Medicine

## 2020-08-01 ENCOUNTER — Ambulatory Visit
Admission: RE | Admit: 2020-08-01 | Discharge: 2020-08-01 | Disposition: A | Discharge status: Home / Self Care | Payer: Managed Care, Other (non HMO) | Source: Ambulatory Visit | Attending: Urgent Care | Admitting: Urgent Care

## 2020-08-01 VITALS — BP 138/90 | HR 95 | Temp 99.2°F | Resp 20

## 2020-08-01 DIAGNOSIS — R300 Dysuria: Secondary | ICD-10-CM | POA: Insufficient documentation

## 2020-08-01 DIAGNOSIS — R102 Pelvic and perineal pain: Secondary | ICD-10-CM | POA: Diagnosis present

## 2020-08-01 HISTORY — DX: Attention-deficit hyperactivity disorder, unspecified type: F90.9

## 2020-08-01 LAB — POCT URINALYSIS DIP (MANUAL ENTRY)
Bilirubin, UA: NEGATIVE
Glucose, UA: NEGATIVE mg/dL
Ketones, POC UA: NEGATIVE mg/dL
Nitrite, UA: NEGATIVE
Protein Ur, POC: NEGATIVE mg/dL
Spec Grav, UA: <=1.005 — AB (ref 1.010–1.025)
Urobilinogen, UA: 0.2 E.U./dL
pH, UA: 5.5 (ref 5.0–8.0)

## 2020-08-01 LAB — POCT URINE PREGNANCY: Preg Test, Ur: NEGATIVE

## 2020-08-01 NOTE — Discharge Instructions (Signed)
Make sure you hydrate very well with plain water and a quantity of 64 ounces of water a day.  Please limit drinks that are considered urinary irritants such as soda, sweet tea, coffee, energy drinks, alcohol.  These can worsen your urinary symptoms and also be the source of them.  I will let you know about your urine culture results through MyChart to see if we need to prescribe you antibiotics based off of those results.

## 2020-08-01 NOTE — ED Provider Notes (Signed)
Cynthia Lewis   MRN: [id number] DOB: 1985/09/22  Subjective:   Cynthia Lewis is a 34 y.o. female presenting for 3-4 day hx of dysuria, pelvic pressure.  I have patient had sex with 1 female partner and did not use condoms about 3 to 4 days ago.  Denies fever, nausea, vomiting, abdominal or pelvic pain, vaginal discharge or genital rash.  Patient would like to make sure she gets checked for gonorrhea, chlamydia, trichomonas just to make sure she does not have an STI.  She does have an IUD in place, is not passed the expiration date.  Patient admits that she does not hydrate very well with water.  She drinks a lot of urinary irritants but is trying to hydrate better in the past few days.  No current facility-administered medications for this encounter.  Current Outpatient Medications:  .  ALPRAZolam (XANAX) 0.25 MG tablet, Take 1 tablet (0.25 mg total) by mouth 2 (two) times daily as needed. for anxiety, Disp: 30 tablet, Rfl: 5 .  citalopram (CELEXA) 20 MG tablet, Take 1 tablet (20 mg total) by mouth daily., Disp: 90 tablet, Rfl: 3 .  levonorgestrel (MIRENA) 20 MCG/24HR IUD, 1 each by Intrauterine route once., Disp: , Rfl:  .  lisdexamfetamine (VYVANSE) 40 MG capsule, Take 1 capsule (40 mg total) by mouth daily., Disp: 30 capsule, Rfl: 0 .  diclofenac (VOLTAREN) 75 MG EC tablet, Take 1 tablet (75 mg total) by mouth 2 (two) times daily., Disp: 60 tablet, Rfl: 1 .  lisdexamfetamine (VYVANSE) 40 MG capsule, Take 1 capsule (40 mg total) by mouth daily., Disp: 30 capsule, Rfl: 0 .  lisdexamfetamine (VYVANSE) 40 MG capsule, Take 1 capsule (40 mg total) by mouth daily., Disp: 30 capsule, Rfl: 0 .  methylPREDNISolone (MEDROL DOSEPAK) 4 MG TBPK tablet, 6 day dose pack - take as directed, Disp: 21 tablet, Rfl: 0 .  ondansetron (ZOFRAN-ODT) 4 MG disintegrating tablet, DISSOLVE 1 TABLET BY MOUTH EVERY 8 HOURS AS NEEDED FOR NAUSEA OR VOMITING, Disp: 20 tablet, Rfl: 0   Allergies  Allergen  Reactions  . Amoxicillin Nausea And Vomiting    Past Medical History:  Diagnosis Date  . Anxiety   . Hx of dysplastic nevus 2012   Left medial buttock   . Hypertension      No past surgical history on file.  Family History  Problem Relation Age of Onset  . Ovarian cancer Mother   . Anxiety disorder Father   . Diabetes Father   . Hypertension Father   . Hyperlipidemia Father   . Cancer Paternal Grandmother 38       breast ca  . Diabetes Paternal Grandmother   . Anxiety disorder Maternal Grandmother     Social History   Tobacco Use  . Smoking status: Never Smoker  . Smokeless tobacco: Never Used  Substance Use Topics  . Alcohol use: Yes  . Drug use: No    ROS   Objective:   Vitals: There were no vitals taken for this visit.  Physical Exam Constitutional:      General: She is not in acute distress.    Appearance: Normal appearance. She is well-developed. She is obese. She is not ill-appearing, toxic-appearing or diaphoretic.  HENT:     Head: Normocephalic and atraumatic.     Nose: Nose normal.     Mouth/Throat:     Mouth: Mucous membranes are moist.     Pharynx: Oropharynx is clear.  Eyes:     General:  No scleral icterus.       Right eye: No discharge.        Left eye: No discharge.     Extraocular Movements: Extraocular movements intact.     Conjunctiva/sclera: Conjunctivae normal.     Pupils: Pupils are equal, round, and reactive to light.  Cardiovascular:     Rate and Rhythm: Normal rate.  Pulmonary:     Effort: Pulmonary effort is normal.  Abdominal:     General: Bowel sounds are normal. There is no distension.     Palpations: Abdomen is soft. There is no mass.     Tenderness: There is abdominal tenderness (mild tenderness over bilateral pelvic region). There is no right CVA tenderness, left CVA tenderness, guarding or rebound.  Skin:    General: Skin is warm and dry.  Neurological:     General: No focal deficit present.     Mental Status:  She is alert and oriented to person, place, and time.  Psychiatric:        Mood and Affect: Mood normal.        Behavior: Behavior normal.        Thought Content: Thought content normal.        Judgment: Judgment normal.     Results for orders placed or performed during the hospital encounter of 08/01/20 (from the past 24 hour(s))  POCT urinalysis dipstick     Status: Abnormal   Collection Time: 08/01/20 12:46 PM  Result Value Ref Range   Color, UA yellow yellow   Clarity, UA clear (A) clear   Glucose, UA negative negative mg/dL   Bilirubin, UA negative negative   Ketones, POC UA negative negative mg/dL   Spec Grav, UA <=9.562 (A) 1.010 - 1.025   Blood, UA moderate (A) negative   pH, UA 5.5 5.0 - 8.0   Protein Ur, POC negative negative mg/dL   Urobilinogen, UA 0.2 0.2 or 1.0 E.U./dL   Nitrite, UA Negative Negative   Leukocytes, UA Small (1+) (A) Negative  POCT urine pregnancy     Status: None   Collection Time: 08/01/20 12:46 PM  Result Value Ref Range   Preg Test, Ur Negative Negative    Assessment and Plan :   PDMP not reviewed this encounter.  1. Dysuria   2. Pelvic pressure in female   3. Pelvic pain in female     Counseled on the general limitations of testing to screen for STIs.  Patient verbalized understanding and wants to proceed anyhow.  Suspect that she is having urinary irritation from lack of hydration and drinking urinary irritants.  Urine culture pending.  STI testing pending.  Recommended better hydration and avoidance of urinary irritants. Counseled patient on potential for adverse effects with medications prescribed/recommended today, ER and return-to-clinic precautions discussed, patient verbalized understanding.    Wallis Bamberg, [place] 08/01/20 1307

## 2020-08-01 NOTE — ED Triage Notes (Signed)
Pt reports pain with urination since unprotected intercourse 4 days ago.  No visible blood in toilet but some blood when wiping.  Also requesting STI testing.  No rash, no discharge.

## 2020-08-02 LAB — CERVICOVAGINAL ANCILLARY ONLY
Chlamydia: NEGATIVE
Comment: NEGATIVE
Comment: NEGATIVE
Comment: NORMAL
Neisseria Gonorrhea: NEGATIVE
Trichomonas: NEGATIVE

## 2020-08-03 ENCOUNTER — Telehealth (HOSPITAL_COMMUNITY): Payer: Self-pay | Admitting: Physician Assistant

## 2020-08-03 LAB — URINE CULTURE: Culture: >=100000 — AB

## 2020-08-03 MED ORDER — NITROFURANTOIN MONOHYD MACRO 100 MG PO CAPS: 100.0000 mg | ORAL_CAPSULE | Freq: Two times a day (BID) | ORAL | 0 refills | Status: DC

## 2020-08-03 NOTE — Telephone Encounter (Signed)
Patient called back for urine culture results and starting on medicine. Urine culture grew out e coli, susceptible to keflex, macrobid, bactrim. Patient with allergy to amoxcillin, will avoid keflex for now. macrobid called into pharmacy.

## 2020-09-02 ENCOUNTER — Other Ambulatory Visit: Payer: Self-pay | Admitting: Internal Medicine

## 2020-09-03 NOTE — Telephone Encounter (Signed)
Refill for 30 days only.  OFFICE VISIT NEEDED prior to any more refills. PLEASE NOTIFY PATIENT

## 2020-09-04 ENCOUNTER — Other Ambulatory Visit: Payer: Self-pay

## 2020-09-04 ENCOUNTER — Ambulatory Visit: Payer: Managed Care, Other (non HMO) | Admitting: Internal Medicine

## 2020-09-04 ENCOUNTER — Encounter: Payer: Self-pay | Admitting: Internal Medicine

## 2020-09-04 VITALS — BP 110/82 | HR 85 | Temp 98.7°F | Resp 15 | Ht 66.0 in | Wt 282.0 lb

## 2020-09-04 DIAGNOSIS — Z6832 Body mass index (BMI) 32.0-32.9, adult: Secondary | ICD-10-CM

## 2020-09-04 DIAGNOSIS — F909 Attention-deficit hyperactivity disorder, unspecified type: Secondary | ICD-10-CM | POA: Diagnosis not present

## 2020-09-04 DIAGNOSIS — E66811 Obesity, class 1: Secondary | ICD-10-CM

## 2020-09-04 DIAGNOSIS — E6609 Other obesity due to excess calories: Secondary | ICD-10-CM

## 2020-09-04 DIAGNOSIS — Z9229 Personal history of other drug therapy: Secondary | ICD-10-CM | POA: Diagnosis not present

## 2020-09-04 DIAGNOSIS — Z1152 Encounter for screening for COVID-19: Secondary | ICD-10-CM

## 2020-09-04 DIAGNOSIS — F411 Generalized anxiety disorder: Secondary | ICD-10-CM

## 2020-09-04 MED ORDER — LISDEXAMFETAMINE DIMESYLATE 40 MG PO CAPS: 40.0000 mg | ORAL_CAPSULE | Freq: Every day | ORAL | 0 refills | Status: DC

## 2020-09-04 MED ORDER — ALPRAZOLAM 0.25 MG PO TABS: ORAL_TABLET | ORAL | 5 refills | Status: DC

## 2020-09-04 NOTE — Assessment & Plan Note (Signed)
Diagnosed with formal testing June 2021 with decreased executive function.  Trial of Vyvanse  Well tolerated.  She is functioning well at work and completing her assignments for her master's degree.  No changes today . Refill history confirmed via [place] Controlled Substance databas, accessed by me today.Marland Kitchen

## 2020-09-04 NOTE — Progress Notes (Signed)
Subjective:  Patient ID: Cynthia Lewis, female    DOB: 1985-12-08  Age: 35 y.o. MRN: [id number]  CC: The primary encounter diagnosis was Encounter for screening for COVID-19. Diagnoses of Class 1 obesity due to excess calories without serious comorbidity with body mass index (BMI) of 32.0 to 32.9 in adult, Morbid obesity (HCC), COVID-19 vaccine series completed, Adult ADHD, and GAD (generalized anxiety disorder) were also pertinent to this visit.  HPI Cynthia Lewis presents for follow up on GAD,  ADD and obesity  This visit occurred during the SARS-CoV-2 public health emergency.  Safety protocols were in place, including screening questions prior to the visit, additional usage of staff PPE, and extensive cleaning of exam room while observing appropriate contact time as indicated for disinfecting solutions.     She has gained weight since last visit and BMI is now 45.  She has been stress eating and not exercising due to work [employer] schedule.  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  Working full time at [employer], using alprazolam  For insomnia.  Grandmother hospitalized and transferred to [facility] for rehab since Dec 1 .  Father also was " drinking more."  Over the holidays and this made her nervous.becuase of conflict between parents and father's change in personality when he drinks. She and mother have tried confronting him.:  Her plan is to stop eating out and start cooking more. Weight has plateaued.  Going to [place] for a bachelor's party  Has been vaccinated x 3 ,  Wants to be screened today          Outpatient Medications Prior to Visit  Medication Sig Dispense Refill  . citalopram (CELEXA) 20 MG tablet Take 1 tablet (20 mg total) by mouth daily. 90 tablet 3  . levonorgestrel (MIRENA) 20 MCG/24HR IUD 1 each by Intrauterine route once.    . ondansetron (ZOFRAN-ODT) 4 MG disintegrating tablet DISSOLVE 1 TABLET BY MOUTH EVERY 8 HOURS AS NEEDED FOR NAUSEA OR VOMITING 20  tablet 0  . ALPRAZolam (XANAX) 0.25 MG tablet TAKE 1 TABLET(0.25 MG) BY MOUTH TWICE DAILY AS NEEDED FOR ANXIETY 30 tablet 0  . lisdexamfetamine (VYVANSE) 40 MG capsule Take 1 capsule (40 mg total) by mouth daily. 30 capsule 0  . lisdexamfetamine (VYVANSE) 40 MG capsule Take 1 capsule (40 mg total) by mouth daily. 30 capsule 0  . lisdexamfetamine (VYVANSE) 40 MG capsule Take 1 capsule (40 mg total) by mouth daily. 30 capsule 0  . diclofenac (VOLTAREN) 75 MG EC tablet Take 1 tablet (75 mg total) by mouth 2 (two) times daily. (Patient not taking: Reported on 09/04/2020) 60 tablet 1  . methylPREDNISolone (MEDROL DOSEPAK) 4 MG TBPK tablet 6 day dose pack - take as directed (Patient not taking: Reported on 09/04/2020) 21 tablet 0  . nitrofurantoin, macrocrystal-monohydrate, (MACROBID) 100 MG capsule Take 1 capsule (100 mg total) by mouth 2 (two) times daily. (Patient not taking: Reported on 09/04/2020) 10 capsule 0   No facility-administered medications prior to visit.    Review of Systems;  Patient denies headache, fevers, malaise, unintentional weight loss, skin rash, eye pain, sinus congestion and sinus pain, sore throat, dysphagia,  hemoptysis , cough, dyspnea, wheezing, chest pain, palpitations, orthopnea, edema, abdominal pain, nausea, melena, diarrhea, constipation, flank pain, dysuria, hematuria, urinary  Frequency, nocturia, numbness, tingling, seizures,  Focal weakness, Loss of consciousness,  Tremor, insomnia, depression, anxiety, and suicidal ideation.      Objective:  BP 110/82 (BP  Location: Left Arm, Patient Position: Sitting, Cuff Size: Large)   Pulse 85   Temp 98.7 F (37.1 C) (Oral)   Resp 15   Ht 5\' 6"  (1.676 m)   Wt 282 lb (127.9 kg)   SpO2 98%   BMI 45.52 kg/m   BP Readings from Last 3 Encounters:  09/04/20 110/82  08/01/20 138/90  02/28/20 118/90    Wt Readings from Last 3 Encounters:  09/04/20 282 lb (127.9 kg)  02/28/20 277 lb 3.2 oz (125.7 kg)  09/25/19 245 lb  (111.1 kg)    General appearance: alert, cooperative and appears stated age Ears: normal TM's and external ear canals both ears Throat: lips, mucosa, and tongue normal; teeth and gums normal Neck: no adenopathy, no carotid bruit, supple, symmetrical, trachea midline and thyroid not enlarged, symmetric, no tenderness/mass/nodules Back: symmetric, no curvature. ROM normal. No CVA tenderness. Lungs: clear to auscultation bilaterally Heart: regular rate and rhythm, S1, S2 normal, no murmur, click, rub or gallop Abdomen: soft, non-tender; bowel sounds normal; no masses,  no organomegaly Pulses: 2+ and symmetric Skin: Skin color, texture, turgor normal. No rashes or lesions Lymph nodes: Cervical, supraclavicular, and axillary nodes normal.  Lab Results  Component Value Date   HGBA1C 5.2 03/18/2018   HGBA1C 5.2 03/26/2017   HGBA1C 5.3 03/15/2017    Lab Results  Component Value Date   CREATININE 1.05 (H) 12/02/2018   CREATININE 0.98 03/18/2018   CREATININE 0.93 03/26/2017    Lab Results  Component Value Date   WBC 8.4 03/18/2018   HGB 11.5 03/18/2018   HCT 38.1 03/18/2018   PLT 349 03/18/2018   GLUCOSE 70 12/02/2018   CHOL 167 03/18/2018   TRIG 148 03/18/2018   HDL 66 03/18/2018   LDLCALC 71 03/18/2018   ALT 11 12/02/2018   AST 14 12/02/2018   NA 142 12/02/2018   K 4.3 12/02/2018   CL 102 12/02/2018   CREATININE 1.05 (H) 12/02/2018   BUN 13 12/02/2018   CO2 24 12/02/2018   TSH 2.870 03/18/2018   HGBA1C 5.2 03/18/2018    No results found.  Assessment & Plan:   Problem List Items Addressed This Visit      Unprioritized   Adult ADHD    Diagnosed with formal testing June 2021 with decreased executive function.  Trial of Vyvanse  Well tolerated.  She is functioning well at work and completing her assignments for her master's degree.  No changes today . Refill history confirmed via [place] Controlled Substance databas, accessed by me today..      Relevant Medications    lisdexamfetamine (VYVANSE) 40 MG capsule (Start on 11/02/2020)   lisdexamfetamine (VYVANSE) 40 MG capsule (Start on 10/04/2020)   lisdexamfetamine (VYVANSE) 40 MG capsule   GAD (generalized anxiety disorder)    She feels that her symptom have improved since she began effective  treatment of her ADD and continued use of citalopram .   She using alprazolam once a week prn insomnia. The risks and benefits of benzodiazepine use were reviewed with patient today including excessive sedation leading to respiratory depression,  impaired thinking/driving, and addiction.  Patient was advised to avoid concurrent use with alcohol, to use medication only as needed and not to share with others  .        Relevant Medications   ALPRAZolam (XANAX) 0.25 MG tablet   Obesity    I have addressed  BMI and recommended a low glycemic index diet utilizing smaller more frequent meals to increase  metabolism.  I have also recommended that patient start exercising with a goal of 30 minutes of aerobic exercise a minimum of 5 days per week.Recommended the Optavia diet for managemet of morbid obesity. Screening for thyroid,  Lipids and diabetes to be done.       Relevant Medications   lisdexamfetamine (VYVANSE) 40 MG capsule (Start on 11/02/2020)   lisdexamfetamine (VYVANSE) 40 MG capsule (Start on 10/04/2020)   lisdexamfetamine (VYVANSE) 40 MG capsule    Other Visit Diagnoses    Encounter for screening for COVID-19    -  Primary   Relevant Orders   Novel Coronavirus, NAA ([facility])   SARS-CoV-2 Semi-Quantitative Total Antibody, Spike   Morbid obesity (HCC)       Relevant Medications   lisdexamfetamine (VYVANSE) 40 MG capsule (Start on 11/02/2020)   lisdexamfetamine (VYVANSE) 40 MG capsule (Start on 10/04/2020)   lisdexamfetamine (VYVANSE) 40 MG capsule   Other Relevant Orders   Comprehensive metabolic panel   Hemoglobin A1c   Lipid panel   TSH   COVID-19 vaccine series completed          I have discontinued Samara Deist  E. Kisiel's methylPREDNISolone, diclofenac, and nitrofurantoin (macrocrystal-monohydrate). I have also changed her ALPRAZolam. Additionally, I am having her maintain her levonorgestrel, citalopram, ondansetron, lisdexamfetamine, lisdexamfetamine, and lisdexamfetamine.  Meds ordered this encounter  Medications  . lisdexamfetamine (VYVANSE) 40 MG capsule    Sig: Take 1 capsule (40 mg total) by mouth daily.    Dispense:  30 capsule    Refill:  0  . lisdexamfetamine (VYVANSE) 40 MG capsule    Sig: Take 1 capsule (40 mg total) by mouth daily.    Dispense:  30 capsule    Refill:  0  . lisdexamfetamine (VYVANSE) 40 MG capsule    Sig: Take 1 capsule (40 mg total) by mouth daily.    Dispense:  30 capsule    Refill:  0  . ALPRAZolam (XANAX) 0.25 MG tablet    Sig: TAKE 1 TABLET(0.25 MG) BY MOUTH ONCE DAILY  AS NEEDED FOR ANXIETY    Dispense:  30 tablet    Refill:  5    Medications Discontinued During This Encounter  Medication Reason  . diclofenac (VOLTAREN) 75 MG EC tablet   . methylPREDNISolone (MEDROL DOSEPAK) 4 MG TBPK tablet   . nitrofurantoin, macrocrystal-monohydrate, (MACROBID) 100 MG capsule   . lisdexamfetamine (VYVANSE) 40 MG capsule Reorder  . lisdexamfetamine (VYVANSE) 40 MG capsule Reorder  . lisdexamfetamine (VYVANSE) 40 MG capsule Reorder  . ALPRAZolam (XANAX) 0.25 MG tablet Reorder    Follow-up: No follow-ups on file.   Sherlene Shams, MD

## 2020-09-04 NOTE — Assessment & Plan Note (Signed)
I have addressed  BMI and recommended a low glycemic index diet utilizing smaller more frequent meals to increase metabolism.  I have also recommended that patient start exercising with a goal of 30 minutes of aerobic exercise a minimum of 5 days per week.Recommended the Optavia diet for managemet of morbid obesity. Screening for thyroid,  Lipids and diabetes to be done.

## 2020-09-04 NOTE — Patient Instructions (Addendum)
I strongly recommend the commercially available Optavia diet .  Check it out.  100% successful for the 40+ patients of mine who have subscribed to it.    VITAMIN C 1000 MG DAILY VITAMIN D 2000 IU DAILY  ZINC 50 MG DAILY

## 2020-09-04 NOTE — Assessment & Plan Note (Signed)
She feels that her symptom have improved since she began effective  treatment of her ADD and continued use of citalopram .   She using alprazolam once a week prn insomnia. The risks and benefits of benzodiazepine use were reviewed with patient today including excessive sedation leading to respiratory depression,  impaired thinking/driving, and addiction.  Patient was advised to avoid concurrent use with alcohol, to use medication only as needed and not to share with others  .

## 2020-09-05 LAB — COMPREHENSIVE METABOLIC PANEL
ALT: 15 IU/L (ref 0–32)
AST: 13 IU/L (ref 0–40)
Albumin/Globulin Ratio: 1.6 (ref 1.2–2.2)
Albumin: 4.3 g/dL (ref 3.8–4.8)
Alkaline Phosphatase: 88 IU/L (ref 44–121)
BUN/Creatinine Ratio: 7 — ABNORMAL LOW (ref 9–23)
BUN: 7 mg/dL (ref 6–20)
Bilirubin Total: 0.6 mg/dL (ref 0.0–1.2)
CO2: 23 mmol/L (ref 20–29)
Calcium: 9.6 mg/dL (ref 8.7–10.2)
Chloride: 101 mmol/L (ref 96–106)
Creatinine, Ser: 1 mg/dL (ref 0.57–1.00)
GFR calc Af Amer: 86 mL/min/{1.73_m2} (ref >59)
GFR calc non Af Amer: 74 mL/min/{1.73_m2} (ref >59)
Globulin, Total: 2.7 g/dL (ref 1.5–4.5)
Glucose: 86 mg/dL (ref 65–99)
Potassium: 4.6 mmol/L (ref 3.5–5.2)
Sodium: 137 mmol/L (ref 134–144)
Total Protein: 7 g/dL (ref 6.0–8.5)

## 2020-09-05 LAB — HEMOGLOBIN A1C
Est. average glucose Bld gHb Est-mCnc: 103 mg/dL
Hgb A1c MFr Bld: 5.2 % (ref 4.8–5.6)

## 2020-09-05 LAB — LIPID PANEL
Chol/HDL Ratio: 4.1 ratio (ref 0.0–4.4)
Cholesterol, Total: 189 mg/dL (ref 100–199)
HDL: 46 mg/dL (ref >39)
LDL Chol Calc (NIH): 108 mg/dL — ABNORMAL HIGH (ref 0–99)
Triglycerides: 200 mg/dL — ABNORMAL HIGH (ref 0–149)
VLDL Cholesterol Cal: 35 mg/dL (ref 5–40)

## 2020-09-05 LAB — TSH: TSH: 2.28 u[IU]/mL (ref 0.450–4.500)

## 2020-09-05 NOTE — Progress Notes (Signed)
Your a1c, thyroid , liver and kidney function are normal.  You are not  prediabetic    All of your labs are normal, except your triglycerides which is raising your total cholesterol by 20 pts/ .  This should improve with a low glycemic index diet and Exercise .  We should repeat in 6 months.   Regards,  Dr. Darrick Huntsman

## 2020-09-06 ENCOUNTER — Other Ambulatory Visit: Payer: Managed Care, Other (non HMO)

## 2020-09-06 LAB — SARS-COV-2, NAA 2 DAY TAT

## 2020-09-06 LAB — NOVEL CORONAVIRUS, NAA: SARS-CoV-2, NAA: NOT DETECTED

## 2020-09-06 LAB — SPECIMEN STATUS REPORT

## 2020-09-11 ENCOUNTER — Telehealth (INDEPENDENT_AMBULATORY_CARE_PROVIDER_SITE_OTHER): Payer: Managed Care, Other (non HMO) | Admitting: Family Medicine

## 2020-09-11 ENCOUNTER — Encounter: Payer: Self-pay | Admitting: Family Medicine

## 2020-09-11 DIAGNOSIS — R42 Dizziness and giddiness: Secondary | ICD-10-CM | POA: Diagnosis not present

## 2020-09-11 MED ORDER — MECLIZINE HCL 12.5 MG PO TABS: 12.5000 mg | ORAL_TABLET | Freq: Three times a day (TID) | ORAL | 0 refills | Status: DC | PRN

## 2020-09-11 NOTE — Patient Instructions (Signed)
How to Perform the Epley Maneuver The Epley maneuver is an exercise that relieves symptoms of vertigo. Vertigo is the feeling that you or your surroundings are moving when they are not. When you feel vertigo, you may feel like the room is spinning and may have trouble walking. The Epley maneuver is used for a type of vertigo caused by a calcium deposit in a part of the inner ear. The maneuver involves changing head positions to help the deposit move out of the area. You can do this maneuver at home whenever you have symptoms of vertigo. You can repeat it in 24 hours if your vertigo has not gone away. Even though the Epley maneuver may relieve your vertigo for a few weeks, it is possible that your symptoms will return. This maneuver relieves vertigo, but it does not relieve dizziness. What are the risks? If it is done correctly, the Epley maneuver is considered safe. Sometimes it can lead to dizziness or nausea that goes away after a short time. If you develop other symptoms--such as changes in vision, weakness, or numbness--stop doing the maneuver and call your health care provider. Supplies needed:  A bed or table.  A pillow. How to do the Epley maneuver 1. Sit on the edge of a bed or table with your back straight and your legs extended or hanging over the edge of the bed or table. 2. Turn your head halfway toward the affected ear or side as told by your health care provider. 3. Lie backward quickly with your head turned until you are lying flat on your back. You may want to position a pillow under your shoulders. 4. Hold this position for at least 30 seconds. If you feel dizzy or have symptoms of vertigo, continue to hold the position until the symptoms stop. 5. Turn your head to the opposite direction until your unaffected ear is facing the floor. 6. Hold this position for at least 30 seconds. If you feel dizzy or have symptoms of vertigo, continue to hold the position until the symptoms  stop. 7. Turn your whole body to the same side as your head so that you are positioned on your side. Your head will now be nearly facedown. Hold for at least 30 seconds. If you feel dizzy or have symptoms of vertigo, continue to hold the position until the symptoms stop. 8. Sit back up. You can repeat the maneuver in 24 hours if your vertigo does not go away.      Follow these instructions at home: For 24 hours after doing the Epley maneuver:  Keep your head in an upright position.  When lying down to sleep or rest, keep your head raised (elevated) with two or more pillows.  Avoid excessive neck movements. Activity  Do not drive or use machinery if you feel dizzy.  After doing the Epley maneuver, return to your normal activities as told by your health care provider. Ask your health care provider what activities are safe for you. General instructions  Drink enough fluid to keep your urine pale yellow.  Do not drink alcohol.  Take over-the-counter and prescription medicines only as told by your health care provider.  Keep all follow-up visits as told by your health care provider. This is important. Preventing vertigo symptoms Ask your health care provider if there is anything you should do at home to prevent vertigo. He or she may recommend that you:  Keep your head elevated with two or more pillows while you sleep.    Do not sleep on the side of your affected ear.  Get up slowly from bed.  Avoid sudden movements during the day.  Avoid extreme head positions or movement, such as looking up or bending over. Contact a health care provider if:  Your vertigo gets worse.  You have other symptoms, including: ? Nausea. ? Vomiting. ? Headache. Get help right away if you:  Have vision changes.  Have a headache or neck pain that is severe or getting worse.  Cannot stop vomiting.  Have new numbness or weakness in any part of your body. Summary  Vertigo is the feeling that  you or your surroundings are moving when they are not.  The Epley maneuver is an exercise that relieves symptoms of vertigo.  If the Epley maneuver is done correctly, it is considered safe and relieves vertigo quickly. This information is not intended to replace advice given to you by your health care provider. Make sure you discuss any questions you have with your health care provider. Document Revised: 06/07/2019 Document Reviewed: 06/07/2019 Elsevier Patient Education  2021 Elsevier Inc.  

## 2020-09-11 NOTE — Assessment & Plan Note (Signed)
I believe this is BPPV based on symptom description.  I will send her modified Epley maneuver to complete at home three times daily until she is without symptoms for 24 hours.  We will also provide meclizine to use on an as-needed basis.  If she has not noticed any improvement in her symptoms by this coming Monday she will let us know.  Advised to seek medical attention for constant vertiginous symptoms.

## 2020-09-11 NOTE — Telephone Encounter (Signed)
Spoke with pt and schedule her for a virtual with Dr. Birdie Sons this morning.

## 2020-09-11 NOTE — Progress Notes (Signed)
Virtual Visit via telephone Note  This visit type was conducted due to national recommendations for restrictions regarding the COVID-19 pandemic (e.g. social distancing).  This format is felt to be most appropriate for this patient at this time.  All issues noted in this document were discussed and addressed.  No physical exam was performed (except for noted visual exam findings with Video Visits).   I connected with Cynthia Lewis today at 10:30 AM EST by telephone and verified that I am speaking with the correct person using two identifiers. Location patient: home Location provider: home office Persons participating in the virtual visit: patient, provider  I discussed the limitations, risks, security and privacy concerns of performing an evaluation and management service by telephone and the availability of in person appointments. I also discussed with the patient that there may be a patient responsible charge related to this service. The patient expressed understanding and agreed to proceed.  Interactive audio and video telecommunications were attempted between this provider and patient, however failed, due to patient having technical difficulties OR patient did not have access to video capability.  We continued and completed visit with audio only.   Reason for visit: same day visit fairly specific kind of vertigo: Benign paroxysmal positional vertigo  HPI: Vertigo: Patient notes she went on a trip out of state recently and when the plane was turning her head jostled and she felt a little dizzy at that time.  Subsequently she bent over to the ground to pick something up and the room started spinning around her later in the day.  This has been an issue since then.  It occurs intermittently with change in head position.  Does occur in bed when she changes her head position.  Notes a history of this in the past.  No hearing loss, tinnitus, or ear fullness.   ROS: See pertinent positives and  negatives per HPI.  Past Medical History:  Diagnosis Date  . ADHD   . Anxiety   . Hx of dysplastic nevus 2012   Left medial buttock   . Hypertension     No past surgical history on file.  Family History  Problem Relation Age of Onset  . Ovarian cancer Mother   . Anxiety disorder Father   . Diabetes Father   . Hypertension Father   . Hyperlipidemia Father   . Cancer Paternal Grandmother 52       breast ca  . Diabetes Paternal Grandmother   . Anxiety disorder Maternal Grandmother     SOCIAL HX: Non-smoker   Current Outpatient Medications:  .  ALPRAZolam (XANAX) 0.25 MG tablet, TAKE 1 TABLET(0.25 MG) BY MOUTH ONCE DAILY  AS NEEDED FOR ANXIETY, Disp: 30 tablet, Rfl: 5 .  citalopram (CELEXA) 20 MG tablet, Take 1 tablet (20 mg total) by mouth daily., Disp: 90 tablet, Rfl: 3 .  levonorgestrel (MIRENA) 20 MCG/24HR IUD, 1 each by Intrauterine route once., Disp: , Rfl:  .  [START ON 11/02/2020] lisdexamfetamine (VYVANSE) 40 MG capsule, Take 1 capsule (40 mg total) by mouth daily., Disp: 30 capsule, Rfl: 0 .  [START ON 10/04/2020] lisdexamfetamine (VYVANSE) 40 MG capsule, Take 1 capsule (40 mg total) by mouth daily., Disp: 30 capsule, Rfl: 0 .  lisdexamfetamine (VYVANSE) 40 MG capsule, Take 1 capsule (40 mg total) by mouth daily., Disp: 30 capsule, Rfl: 0 .  meclizine (ANTIVERT) 12.5 MG tablet, Take 1 tablet (12.5 mg total) by mouth 3 (three) times daily as needed for dizziness., Disp: 30  tablet, Rfl: 0 .  ondansetron (ZOFRAN-ODT) 4 MG disintegrating tablet, DISSOLVE 1 TABLET BY MOUTH EVERY 8 HOURS AS NEEDED FOR NAUSEA OR VOMITING, Disp: 20 tablet, Rfl: 0  EXAM: This was a telephone visit and thus no physical exam was completed.  ASSESSMENT AND PLAN:  Discussed the following assessment and plan:  Problem List Items Addressed This Visit    Vertigo    I believe this is BPPV based on symptom description.  I will send her modified Epley maneuver to complete at home three times daily  until she is without symptoms for 24 hours.  We will also provide meclizine to use on an as-needed basis.  If she has not noticed any improvement in her symptoms by this coming Monday she will let us know.  Advised to seek medical attention for constant vertiginous symptoms.            I discussed the assessment and treatment plan with the patient. The patient was provided an opportunity to ask questions and all were answered. The patient agreed with the plan and demonstrated an understanding of the instructions.   The patient was advised to call back or seek an in-person evaluation if the symptoms worsen or if the condition fails to improve as anticipated.  I provided 6 minutes of non-face-to-face time during this encounter.   Marikay Alar, MD

## 2020-09-12 ENCOUNTER — Other Ambulatory Visit: Payer: Managed Care, Other (non HMO)

## 2020-09-13 ENCOUNTER — Other Ambulatory Visit: Payer: Managed Care, Other (non HMO)

## 2020-09-16 NOTE — Telephone Encounter (Signed)
Pt saw Dr. Birdie Sons on 09/11/20 she said that she is still having the Vertigo and wanted to know what the next step was

## 2020-09-17 NOTE — Telephone Encounter (Signed)
Please follow-up with the patient to see if her symptoms are the same as during our visit last week. Has she been able to complete the exercises that were sent to her in her AVS on mychart? Were they helpful at all? Has she developed any new symptoms?

## 2020-09-20 ENCOUNTER — Telehealth: Payer: Self-pay

## 2020-09-20 DIAGNOSIS — H8113 Benign paroxysmal vertigo, bilateral: Secondary | ICD-10-CM

## 2020-09-20 NOTE — Telephone Encounter (Addendum)
Error.  Johnedward Brodrick,cma  

## 2020-09-20 NOTE — Telephone Encounter (Signed)
Noted. At this point I would suggest vestibular rehab which is a form of PT. Would she be ok with a referral for that?

## 2020-09-20 NOTE — Telephone Encounter (Deleted)
LVM for the patient to call back and ask for Nina.  Nina,cma  

## 2020-09-23 NOTE — Telephone Encounter (Signed)
PT vestibular rehab referral placed.

## 2020-09-24 LAB — HM PAP SMEAR: HM Pap smear: ABNORMAL

## 2020-10-22 ENCOUNTER — Other Ambulatory Visit: Payer: Self-pay | Admitting: Internal Medicine

## 2020-10-25 NOTE — Telephone Encounter (Signed)
Error.  Tinsley Lomas,cma  

## 2020-11-20 ENCOUNTER — Telehealth: Payer: Self-pay | Admitting: Podiatry

## 2020-11-20 NOTE — Telephone Encounter (Signed)
Pt left message stating she was wanting to discuss ordering another pair of orthotics.   I returned call and left message for pt that she would need an appt with at least one of our assistants to discuss ordering another pair if we are billing insurance.

## 2020-12-04 ENCOUNTER — Encounter: Payer: Self-pay | Admitting: Internal Medicine

## 2020-12-04 ENCOUNTER — Ambulatory Visit: Payer: Managed Care, Other (non HMO) | Admitting: Internal Medicine

## 2020-12-04 ENCOUNTER — Other Ambulatory Visit: Payer: Self-pay

## 2020-12-04 DIAGNOSIS — F909 Attention-deficit hyperactivity disorder, unspecified type: Secondary | ICD-10-CM

## 2020-12-04 DIAGNOSIS — R42 Dizziness and giddiness: Secondary | ICD-10-CM

## 2020-12-04 DIAGNOSIS — Z6832 Body mass index (BMI) 32.0-32.9, adult: Secondary | ICD-10-CM | POA: Diagnosis not present

## 2020-12-04 DIAGNOSIS — E6609 Other obesity due to excess calories: Secondary | ICD-10-CM

## 2020-12-04 MED ORDER — LISDEXAMFETAMINE DIMESYLATE 40 MG PO CAPS: 40.0000 mg | ORAL_CAPSULE | Freq: Every day | ORAL | 0 refills | Status: DC
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
## 2020-12-06 NOTE — Assessment & Plan Note (Signed)
I have addressed  BMI and recommended a low glycemic index diet utilizing smaller more frequent meals to increase metabolism.  I have also recommended that patient start exercising with a goal of 30 minutes of aerobic exercise a minimum of 5 days per week.Recommended the Optavia diet for managemet of morbid obesity. Screening for thyroid,  Lipids and diabetes to be done.
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
## 2020-12-06 NOTE — Assessment & Plan Note (Addendum)
Diagnosed with formal testing June 2021 with decreased executive function.  Tolerating Vyvanse 40 mg daily .  She is functioning well at work and completing her assignments for her master's degree.  No changes today . Refill history confirmed via [place] Controlled Substance databas, accessed by me today.Marland Kitchen

## 2021-01-11 ENCOUNTER — Encounter: Payer: Self-pay | Admitting: Emergency Medicine

## 2021-01-11 ENCOUNTER — Emergency Department: Payer: Managed Care, Other (non HMO)

## 2021-01-11 ENCOUNTER — Other Ambulatory Visit: Payer: Self-pay

## 2021-01-11 ENCOUNTER — Observation Stay: Payer: Managed Care, Other (non HMO)

## 2021-01-11 ENCOUNTER — Inpatient Hospital Stay
Admission: EM | Admit: 2021-01-11 | Discharge: 2021-01-14 | DRG: 494 | Disposition: A | Discharge status: Home / Self Care | Payer: Managed Care, Other (non HMO) | Attending: Orthopedic Surgery | Admitting: Orthopedic Surgery

## 2021-01-11 DIAGNOSIS — S82852A Displaced trimalleolar fracture of left lower leg, initial encounter for closed fracture: Secondary | ICD-10-CM | POA: Diagnosis not present

## 2021-01-11 DIAGNOSIS — S82892A Other fracture of left lower leg, initial encounter for closed fracture: Secondary | ICD-10-CM | POA: Diagnosis not present

## 2021-01-11 DIAGNOSIS — Z833 Family history of diabetes mellitus: Secondary | ICD-10-CM

## 2021-01-11 DIAGNOSIS — F909 Attention-deficit hyperactivity disorder, unspecified type: Secondary | ICD-10-CM | POA: Diagnosis present

## 2021-01-11 DIAGNOSIS — Y93K1 Activity, walking an animal: Secondary | ICD-10-CM

## 2021-01-11 DIAGNOSIS — F419 Anxiety disorder, unspecified: Secondary | ICD-10-CM | POA: Diagnosis present

## 2021-01-11 DIAGNOSIS — Z20822 Contact with and (suspected) exposure to covid-19: Secondary | ICD-10-CM | POA: Diagnosis present

## 2021-01-11 DIAGNOSIS — T148XXA Other injury of unspecified body region, initial encounter: Secondary | ICD-10-CM

## 2021-01-11 DIAGNOSIS — W1781XA Fall down embankment (hill), initial encounter: Secondary | ICD-10-CM

## 2021-01-11 DIAGNOSIS — Z8249 Family history of ischemic heart disease and other diseases of the circulatory system: Secondary | ICD-10-CM

## 2021-01-11 DIAGNOSIS — Z83438 Family history of other disorder of lipoprotein metabolism and other lipidemia: Secondary | ICD-10-CM

## 2021-01-11 HISTORY — DX: Other fracture of left lower leg, initial encounter for closed fracture: S82.892A

## 2021-01-11 LAB — COMPREHENSIVE METABOLIC PANEL
ALT: 16 U/L (ref 0–44)
AST: 17 U/L (ref 15–41)
Albumin: 3.7 g/dL (ref 3.5–5.0)
Alkaline Phosphatase: 68 U/L (ref 38–126)
Anion gap: 9 (ref 5–15)
BUN: 13 mg/dL (ref 6–20)
CO2: 24 mmol/L (ref 22–32)
Calcium: 8.9 mg/dL (ref 8.9–10.3)
Chloride: 104 mmol/L (ref 98–111)
Creatinine, Ser: 0.93 mg/dL (ref 0.44–1.00)
GFR, Estimated: >60 mL/min (ref >60)
Glucose, Bld: 132 mg/dL — ABNORMAL HIGH (ref 70–99)
Potassium: 4.1 mmol/L (ref 3.5–5.1)
Sodium: 137 mmol/L (ref 135–145)
Total Bilirubin: 0.9 mg/dL (ref 0.3–1.2)
Total Protein: 7.2 g/dL (ref 6.5–8.1)

## 2021-01-11 LAB — SURGICAL PCR SCREEN
MRSA, PCR: NEGATIVE
Staphylococcus aureus: NEGATIVE

## 2021-01-11 LAB — CBC WITH DIFFERENTIAL/PLATELET
Abs Immature Granulocytes: 0.09 10*3/uL — ABNORMAL HIGH (ref 0.00–0.07)
Basophils Absolute: 0 10*3/uL (ref 0.0–0.1)
Basophils Relative: 0 %
Eosinophils Absolute: 0.1 10*3/uL (ref 0.0–0.5)
Eosinophils Relative: 0 %
HCT: 41.9 % (ref 36.0–46.0)
Hemoglobin: 14.6 g/dL (ref 12.0–15.0)
Immature Granulocytes: 1 %
Lymphocytes Relative: 12 %
Lymphs Abs: 1.9 10*3/uL (ref 0.7–4.0)
MCH: 31.2 pg (ref 26.0–34.0)
MCHC: 34.8 g/dL (ref 30.0–36.0)
MCV: 89.5 fL (ref 80.0–100.0)
Monocytes Absolute: 0.8 10*3/uL (ref 0.1–1.0)
Monocytes Relative: 5 %
Neutro Abs: 13.6 10*3/uL — ABNORMAL HIGH (ref 1.7–7.7)
Neutrophils Relative %: 82 %
Platelets: 301 10*3/uL (ref 150–400)
RBC: 4.68 MIL/uL (ref 3.87–5.11)
RDW: 12.3 % (ref 11.5–15.5)
WBC: 16.5 10*3/uL — ABNORMAL HIGH (ref 4.0–10.5)
nRBC: 0 % (ref 0.0–0.2)

## 2021-01-11 LAB — RESP PANEL BY RT-PCR (FLU A&B, COVID) ARPGX2
Influenza A by PCR: NEGATIVE
Influenza B by PCR: NEGATIVE
SARS Coronavirus 2 by RT PCR: NEGATIVE

## 2021-01-11 LAB — PROTIME-INR
INR: 1 (ref 0.8–1.2)
Prothrombin Time: 13.5 seconds (ref 11.4–15.2)

## 2021-01-11 LAB — APTT: aPTT: 25 seconds (ref 24–36)

## 2021-01-11 MED ORDER — BISACODYL 5 MG PO TBEC
5.0000 mg | DELAYED_RELEASE_TABLET | Freq: Every day | ORAL | Status: DC | PRN
  Administered 2021-01-14: 5 mg via ORAL
  Filled 2021-01-11 (×2): qty 1

## 2021-01-11 MED ORDER — MORPHINE SULFATE (PF) 2 MG/ML IV SOLN
2.0000 mg | INTRAVENOUS | Status: DC | PRN
  Administered 2021-01-12: 2 mg via INTRAVENOUS
  Filled 2021-01-11: qty 1

## 2021-01-11 MED ORDER — DOCUSATE SODIUM 100 MG PO CAPS
100.0000 mg | ORAL_CAPSULE | Freq: Two times a day (BID) | ORAL | Status: DC
  Administered 2021-01-11 – 2021-01-14 (×5): 100 mg via ORAL
  Filled 2021-01-11 (×6): qty 1

## 2021-01-11 MED ORDER — MORPHINE SULFATE (PF) 4 MG/ML IV SOLN
4.0000 mg | Freq: Once | INTRAVENOUS | Status: AC
  Administered 2021-01-11: 4 mg via INTRAVENOUS
  Filled 2021-01-11: qty 1

## 2021-01-11 MED ORDER — OXYCODONE-ACETAMINOPHEN 7.5-325 MG PO TABS
1.0000 | ORAL_TABLET | Freq: Once | ORAL | Status: AC
  Administered 2021-01-11: 1 via ORAL
  Filled 2021-01-11: qty 1

## 2021-01-11 MED ORDER — SODIUM CHLORIDE 0.9 % IV SOLN: INTRAVENOUS | Status: DC

## 2021-01-11 MED ORDER — ONDANSETRON HCL 4 MG/2ML IJ SOLN
4.0000 mg | Freq: Once | INTRAMUSCULAR | Status: AC
  Administered 2021-01-11: 4 mg via INTRAVENOUS
  Filled 2021-01-11: qty 2

## 2021-01-11 MED ORDER — METHOCARBAMOL 500 MG PO TABS
500.0000 mg | ORAL_TABLET | Freq: Four times a day (QID) | ORAL | Status: DC | PRN
Start: 2021-01-11 — End: 2021-01-14
  Administered 2021-01-12 – 2021-01-14 (×3): 500 mg via ORAL
  Filled 2021-01-11 (×4): qty 1

## 2021-01-11 MED ORDER — OXYCODONE HCL 5 MG PO TABS
5.0000 mg | ORAL_TABLET | ORAL | Status: DC | PRN
  Administered 2021-01-11 (×2): 5 mg via ORAL
  Administered 2021-01-12: 10 mg via ORAL
  Administered 2021-01-12 (×2): 5 mg via ORAL
  Filled 2021-01-11 (×3): qty 1
  Filled 2021-01-11: qty 2
  Filled 2021-01-11: qty 1

## 2021-01-11 MED ORDER — METHOCARBAMOL 1000 MG/10ML IJ SOLN
500.0000 mg | Freq: Four times a day (QID) | INTRAVENOUS | Status: DC | PRN
  Filled 2021-01-11: qty 5

## 2021-01-11 MED ORDER — SENNOSIDES-DOCUSATE SODIUM 8.6-50 MG PO TABS: 1.0000 | ORAL_TABLET | Freq: Every evening | ORAL | Status: DC | PRN

## 2021-01-11 MED ORDER — CEFAZOLIN SODIUM-DEXTROSE 2-4 GM/100ML-% IV SOLN: 2.0000 g | INTRAVENOUS | Status: DC

## 2021-01-11 NOTE — ED Triage Notes (Signed)
Pt via POV. Pt was walking her dog and fell down a hill and twisted her L ankle. Pt states that she is unable to bear weight on the leg. Pt is A&Ox4 and NAD.

## 2021-01-11 NOTE — H&P (Signed)
PREOPERATIVE H&P  Chief Complaint: Left ankle pain status post fall  HPI: Cynthia Lewis is a 35 y.o. female who presents for preoperative history and physical with a diagnosis of left ankle pain status post fall.  Patient states she was walking her dog and fell down a hill sustaining a twisting injury to her left ankle.  She had deformity and an inability to bear weight after the injury.  She needed to crawl on her hands and knees to get help.  X-rays in the ED reveal a trimalleolar ankle fracture equivalent which is closed.  Past Medical History:  Diagnosis Date  . ADHD   . Anxiety   . Hx of dysplastic nevus 2012   Left medial buttock   . Hypertension    History reviewed. No pertinent surgical history. Social History   Socioeconomic History  . Marital status: Single    Spouse name: Not on file  . Number of children: Not on file  . Years of education: Not on file  . Highest education level: Not on file  Occupational History  . Not on file  Tobacco Use  . Smoking status: Never Smoker  . Smokeless tobacco: Never Used  Substance and Sexual Activity  . Alcohol use: Yes  . Drug use: No  . Sexual activity: Yes    Birth control/protection: I.U.D., None  Other Topics Concern  . Not on file  Social History Narrative  . Not on file   Social Determinants of Health   Financial Resource Strain: Not on file  Food Insecurity: Not on file  Transportation Needs: Not on file  Physical Activity: Not on file  Stress: Not on file  Social Connections: Not on file   Family History  Problem Relation Age of Onset  . Ovarian cancer Mother   . Anxiety disorder Father   . Diabetes Father   . Hypertension Father   . Hyperlipidemia Father   . Cancer Paternal Grandmother 25       breast ca  . Diabetes Paternal Grandmother   . Anxiety disorder Maternal Grandmother    Allergies  Allergen Reactions  . Amoxicillin Nausea And Vomiting   Prior to Admission medications   Medication Sig  Start Date End Date Taking? Authorizing Provider  ALPRAZolam (XANAX) 0.25 MG tablet TAKE 1 TABLET(0.25 MG) BY MOUTH ONCE DAILY  AS NEEDED FOR ANXIETY Patient taking differently: Take 0.25 mg by mouth daily as needed for anxiety. 09/04/20  Yes Sherlene Shams, MD  citalopram (CELEXA) 20 MG tablet Take 1 tablet (20 mg total) by mouth daily. 02/28/20  Yes Sherlene Shams, MD  levonorgestrel (MIRENA) 20 MCG/24HR IUD 1 each by Intrauterine route once. 11/11/12  Yes [provider]  lisdexamfetamine (VYVANSE) 40 MG capsule Take 1 capsule (40 mg total) by mouth daily. 01/03/21  Yes Sherlene Shams, MD  ondansetron (ZOFRAN-ODT) 4 MG disintegrating tablet DISSOLVE 1 TABLET BY MOUTH EVERY 8 HOURS AS NEEDED FOR NAUSEA OR VOMITING Patient taking differently: Take 4 mg by mouth every 8 (eight) hours as needed for nausea or vomiting. 10/22/20  Yes Sherlene Shams, MD  lisdexamfetamine (VYVANSE) 40 MG capsule Take 1 capsule (40 mg total) by mouth daily. 02/03/21   Sherlene Shams, MD  lisdexamfetamine (VYVANSE) 40 MG capsule Take 1 capsule (40 mg total) by mouth daily. Patient not taking: No sig reported 12/04/20   Sherlene Shams, MD     Positive ROS: All other systems have been reviewed and were otherwise negative  with the exception of those mentioned in the HPI and as above.  Physical Exam: General: Alert, no acute distress Cardiovascular: Regular rate and rhythm, no murmurs rubs or gallops.  No pedal edema Respiratory: Clear to auscultation bilaterally, no wheezes rales or rhonchi. No cyanosis, no use of accessory musculature GI: No organomegaly, abdomen is soft and non-tender nondistended with positive bowel sounds. Skin: Skin intact, no lesions within the operative field. Neurologic: Sensation intact distally Psychiatric: Patient is competent for consent with normal mood and affect Lymphatic: No cervical lymphadenopathy  MUSCULOSKELETAL: Left ankle: Patient skin is intact.  She has external rotation  and extension deformity.  Her compartments are soft and compressible.  She has palpable pedal pulses, intact sensation light touch and she can gently flex and extend her toes.  Patient has superficial abrasions on both knees from crawling on her hands and needs to get help.  Assessment: Left ankle fracture trimalleolar equivalent  Plan: Reviewed the x-rays of the patient's left ankle.  She has fractures involving the posterior and medial malleolus.  There appears to be widening of the syndesmosis with increased medial clear space.  The fibula does not appear to be fractured.  I closed reduced the patient's ankle in the ER and placed her in an AO splint.  I am getting a CT scan for further evaluation and surgical planning.  Patient will be admitted to the hospital for pain control, neurovascular monitoring and elevation.  Patient will be n.p.o. after midnight.  The plan will be for surgical fixation tomorrow.  I reviewed the details of the operation as well as the postoperative course with the patient and her father.  They understood and agreed with this plan.    Juanell Fairly, MD   01/11/2021 2:34 PM

## 2021-01-11 NOTE — ED Provider Notes (Signed)
[facility] Emergency Department Provider Note  ____________________________________________   Event Date/Time   First MD Initiated Contact with Patient 01/11/21 1133     (approximate)  I have reviewed the triage vital signs and the nursing notes.   HISTORY  Chief Complaint Ankle Pain   HPI TANDI HANKO is a 35 y.o. female presents to the ED with complaint of left ankle pain.  Patient states that she took her dog out and twisted her ankle causing her to fall.  She has been unable to bear weight since the injury.  This occurred approximately 1 hour prior to arrival in the ED.  She denies any head injury or loss of consciousness.  She denies prior ankle fractures.  Rates her pain as 10/10.       Past Medical History:  Diagnosis Date  . ADHD   . Anxiety   . Hx of dysplastic nevus 2012   Left medial buttock   . Hypertension     Patient Active Problem List   Diagnosis Date Noted  . Vertigo 09/11/2020  . Adult ADHD 03/01/2020  . Plantar fasciitis, bilateral 12/21/2018  . Migraine headache 06/10/2018  . Bilateral chronic knee pain 01/15/2018  . GAD (generalized anxiety disorder) 11/05/2012  . Routine general medical examination at a health care facility 11/05/2012  . Obesity 08/05/2011  . Screening for cervical cancer 08/05/2011    History reviewed. No pertinent surgical history.  Prior to Admission medications   Medication Sig Start Date End Date Taking? Authorizing Provider  ALPRAZolam (XANAX) 0.25 MG tablet TAKE 1 TABLET(0.25 MG) BY MOUTH ONCE DAILY  AS NEEDED FOR ANXIETY Patient taking differently: Take 0.25 mg by mouth daily as needed for anxiety. 09/04/20  Yes Sherlene Shams, MD  citalopram (CELEXA) 20 MG tablet Take 1 tablet (20 mg total) by mouth daily. 02/28/20  Yes Sherlene Shams, MD  levonorgestrel (MIRENA) 20 MCG/24HR IUD 1 each by Intrauterine route once. 11/11/12  Yes [provider]  lisdexamfetamine (VYVANSE) 40 MG  capsule Take 1 capsule (40 mg total) by mouth daily. 01/03/21  Yes Sherlene Shams, MD  ondansetron (ZOFRAN-ODT) 4 MG disintegrating tablet DISSOLVE 1 TABLET BY MOUTH EVERY 8 HOURS AS NEEDED FOR NAUSEA OR VOMITING Patient taking differently: Take 4 mg by mouth every 8 (eight) hours as needed for nausea or vomiting. 10/22/20  Yes Sherlene Shams, MD  lisdexamfetamine (VYVANSE) 40 MG capsule Take 1 capsule (40 mg total) by mouth daily. 02/03/21   Sherlene Shams, MD  lisdexamfetamine (VYVANSE) 40 MG capsule Take 1 capsule (40 mg total) by mouth daily. Patient not taking: No sig reported 12/04/20   Sherlene Shams, MD    Allergies Amoxicillin  Family History  Problem Relation Age of Onset  . Ovarian cancer Mother   . Anxiety disorder Father   . Diabetes Father   . Hypertension Father   . Hyperlipidemia Father   . Cancer Paternal Grandmother 68       breast ca  . Diabetes Paternal Grandmother   . Anxiety disorder Maternal Grandmother     Social History Social History   Tobacco Use  . Smoking status: Never Smoker  . Smokeless tobacco: Never Used  Substance Use Topics  . Alcohol use: Yes  . Drug use: No    Review of Systems Constitutional: No fever/chills Eyes: No visual changes. ENT: No trauma. Cardiovascular: Denies chest pain. Respiratory: Denies shortness of breath. Musculoskeletal: Positive for left ankle pain. Skin: Negative  for rash. Neurological: Negative for headaches, focal weakness or numbness. ____________________________________________   PHYSICAL EXAM:  VITAL SIGNS: ED Triage Vitals  Enc Vitals Group     BP      Pulse      Resp      Temp      Temp src      SpO2      Weight      Height      Head Circumference      Peak Flow      Pain Score      Pain Loc      Pain Edu?      Excl. in GC?     Constitutional: Alert and oriented. Well appearing and in no acute distress. Eyes: Conjunctivae are normal. PERRL. EOMI. Head: Atraumatic. Nose: No  trauma. Mouth/Throat: No trauma. Neck: No stridor.  Nontender cervical spine to palpation posteriorly. Cardiovascular: Normal rate, regular rhythm. Grossly normal heart sounds.  Good peripheral circulation. Respiratory: Normal respiratory effort.  No retractions. Lungs CTAB. Musculoskeletal: On examination of left lower leg extremity there is moderate edema and tenderness noted to ankle bilaterally.  Range of motion is moderately restricted secondary to increased pain.  Motor sensory function intact.  Dorsalis pedis intact. Neurologic:  Normal speech and language. No gross focal neurologic deficits are appreciated. No gait instability. Skin:  Skin is warm, dry and intact.  No ecchymosis noted. Psychiatric: Mood and affect are normal. Speech and behavior are normal.  ____________________________________________   LABS (all labs ordered are listed, but only abnormal results are displayed)  Labs Reviewed  CBC WITH DIFFERENTIAL/PLATELET - Abnormal; Notable for the following components:      Result Value   WBC 16.5 (*)    Neutro Abs 13.6 (*)    Abs Immature Granulocytes 0.09 (*)    All other components within normal limits  COMPREHENSIVE METABOLIC PANEL - Abnormal; Notable for the following components:   Glucose, Bld 132 (*)    All other components within normal limits  RESP PANEL BY RT-PCR (FLU A&B, COVID) ARPGX2  POC URINE PREG, ED   ____________________________________________  RADIOLOGY Beaulah Corin, personally viewed and evaluated these images (plain radiographs) as part of my medical decision making, as well as reviewing the written report by the radiologist.   Official radiology report(s): DG Ankle Complete Left  Result Date: 01/11/2021 CLINICAL DATA:  Fall, ankle pain while walking dog this morning. EXAM: LEFT ANKLE COMPLETE - 3+ VIEW COMPARISON:  Foot evaluation from Jan 02, 2020. FINDINGS: Fracture dislocation of the LEFT ankle with syndesmotic widening, fracture of  the medial malleolus and posterior tibia with posterior translation of the talus relative to the tibia. Soft tissue swelling about the ankle. No visible lateral malleolar fracture. IMPRESSION: Fracture dislocation of the LEFT ankle with posterior translation of the talus relative to the tibia, medial malleolar fracture and "posterior malleolus fracture". May be functionally similar to a trimalleolar fracture given syndesmotic widening. CT may be helpful for further evaluation as warranted. Electronically Signed   By: Donzetta Kohut M.D.   On: 01/11/2021 12:45    ____________________________________________   PROCEDURES  Procedure(s) performed (including Critical Care):  Procedures   ____________________________________________   INITIAL IMPRESSION / ASSESSMENT AND PLAN / ED COURSE  As part of my medical decision making, I reviewed the following data within the electronic MEDICAL RECORD NUMBER Notes from prior ED visits and [place] Controlled Substance Database  35 year old female presents to the ED after a fall  that occurred approximately 1 hour prior to arrival.  Patient states that she slid down a  hill while she was walking her dog.  She states her ankle twisted and she has been unable to bear weight since.  She denies any head injury or loss of consciousness during this event.  Physical exam is moderately suspicious for fracture and x-ray confirms.  Dr. Martha Clan was in to see the patient and arrangements are being made for patient to be admitted and surgery tomorrow.  OCL splint was applied by Dr. Martha Clan.  Elevation of the ankle and pain management.  ____________________________________________   FINAL CLINICAL IMPRESSION(S) / ED DIAGNOSES  Final diagnoses:  Closed fracture dislocation of left ankle, initial encounter     ED Discharge Orders    None      *Please note:  DAYSI BOGGAN was evaluated in Emergency Department on 01/11/2021 for the symptoms described in the history of  present illness. She was evaluated in the context of the global COVID-19 pandemic, which necessitated consideration that the patient might be at risk for infection with the SARS-CoV-2 virus that causes COVID-19. Institutional protocols and algorithms that pertain to the evaluation of patients at risk for COVID-19 are in a state of rapid change based on information released by regulatory bodies including the CDC and federal and state organizations. These policies and algorithms were followed during the patient's care in the ED.  Some ED evaluations and interventions may be delayed as a result of limited staffing during and the pandemic.*   Note:  This document was prepared using Dragon voice recognition software and may include unintentional dictation errors.    Tommi Rumps, PA-C 01/11/21 1331    Delton Prairie, MD 01/11/21 [phone]

## 2021-01-12 ENCOUNTER — Encounter: Payer: Self-pay | Admitting: Orthopedic Surgery

## 2021-01-12 ENCOUNTER — Encounter
Admission: EM | Disposition: A | Discharge status: Home / Self Care | Payer: Self-pay | Source: Home / Self Care | Attending: Orthopedic Surgery

## 2021-01-12 ENCOUNTER — Observation Stay: Payer: Managed Care, Other (non HMO) | Admitting: Certified Registered Nurse Anesthetist

## 2021-01-12 ENCOUNTER — Inpatient Hospital Stay: Payer: Managed Care, Other (non HMO)

## 2021-01-12 ENCOUNTER — Observation Stay: Payer: Managed Care, Other (non HMO)

## 2021-01-12 DIAGNOSIS — Z8249 Family history of ischemic heart disease and other diseases of the circulatory system: Secondary | ICD-10-CM | POA: Diagnosis not present

## 2021-01-12 DIAGNOSIS — Z20822 Contact with and (suspected) exposure to covid-19: Secondary | ICD-10-CM | POA: Diagnosis present

## 2021-01-12 DIAGNOSIS — S82892A Other fracture of left lower leg, initial encounter for closed fracture: Secondary | ICD-10-CM | POA: Diagnosis present

## 2021-01-12 DIAGNOSIS — W1781XA Fall down embankment (hill), initial encounter: Secondary | ICD-10-CM | POA: Diagnosis not present

## 2021-01-12 DIAGNOSIS — Z833 Family history of diabetes mellitus: Secondary | ICD-10-CM | POA: Diagnosis not present

## 2021-01-12 DIAGNOSIS — Z83438 Family history of other disorder of lipoprotein metabolism and other lipidemia: Secondary | ICD-10-CM | POA: Diagnosis not present

## 2021-01-12 DIAGNOSIS — F909 Attention-deficit hyperactivity disorder, unspecified type: Secondary | ICD-10-CM | POA: Diagnosis present

## 2021-01-12 DIAGNOSIS — S82852A Displaced trimalleolar fracture of left lower leg, initial encounter for closed fracture: Secondary | ICD-10-CM | POA: Diagnosis present

## 2021-01-12 DIAGNOSIS — F419 Anxiety disorder, unspecified: Secondary | ICD-10-CM | POA: Diagnosis present

## 2021-01-12 DIAGNOSIS — Y93K1 Activity, walking an animal: Secondary | ICD-10-CM | POA: Diagnosis not present

## 2021-01-12 HISTORY — PX: ORIF ANKLE FRACTURE: SHX5408

## 2021-01-12 SURGERY — OPEN REDUCTION INTERNAL FIXATION (ORIF) ANKLE FRACTURE
Anesthesia: General | Site: Ankle | Laterality: Left

## 2021-01-12 MED ORDER — DEXMEDETOMIDINE (PRECEDEX) IN NS 20 MCG/5ML (4 MCG/ML) IV SYRINGE
PREFILLED_SYRINGE | INTRAVENOUS | Status: AC
  Filled 2021-01-12: qty 5

## 2021-01-12 MED ORDER — CITALOPRAM HYDROBROMIDE 20 MG PO TABS
20.0000 mg | ORAL_TABLET | Freq: Every day | ORAL | Status: DC
  Administered 2021-01-13 – 2021-01-14 (×2): 20 mg via ORAL
  Filled 2021-01-12 (×2): qty 1

## 2021-01-12 MED ORDER — POLYETHYLENE GLYCOL 3350 17 G PO PACK
17.0000 g | PACK | Freq: Every day | ORAL | Status: DC | PRN
  Administered 2021-01-13: 17 g via ORAL
  Filled 2021-01-12: qty 1

## 2021-01-12 MED ORDER — OXYCODONE HCL 5 MG PO TABS
10.0000 mg | ORAL_TABLET | ORAL | Status: DC | PRN
  Administered 2021-01-13 – 2021-01-14 (×2): 15 mg via ORAL
  Administered 2021-01-14 (×2): 10 mg via ORAL
  Filled 2021-01-12: qty 3
  Filled 2021-01-12: qty 2
  Filled 2021-01-12: qty 3

## 2021-01-12 MED ORDER — LISDEXAMFETAMINE DIMESYLATE 20 MG PO CAPS
40.0000 mg | ORAL_CAPSULE | Freq: Every day | ORAL | Status: DC
  Administered 2021-01-13 – 2021-01-14 (×2): 40 mg via ORAL
  Filled 2021-01-12 (×3): qty 2

## 2021-01-12 MED ORDER — ONDANSETRON HCL 4 MG/2ML IJ SOLN: 4.0000 mg | Freq: Four times a day (QID) | INTRAMUSCULAR | Status: DC | PRN

## 2021-01-12 MED ORDER — PROPOFOL 10 MG/ML IV BOLUS
INTRAVENOUS | Status: AC
  Filled 2021-01-12: qty 20

## 2021-01-12 MED ORDER — FENTANYL CITRATE (PF) 100 MCG/2ML IJ SOLN
INTRAMUSCULAR | Status: DC | PRN
  Administered 2021-01-12 (×6): 50 ug via INTRAVENOUS

## 2021-01-12 MED ORDER — ACETAMINOPHEN 325 MG PO TABS: 325.0000 mg | ORAL_TABLET | Freq: Four times a day (QID) | ORAL | Status: DC | PRN

## 2021-01-12 MED ORDER — FENTANYL CITRATE (PF) 100 MCG/2ML IJ SOLN
INTRAMUSCULAR | Status: AC
  Filled 2021-01-12: qty 2

## 2021-01-12 MED ORDER — FENTANYL CITRATE (PF) 100 MCG/2ML IJ SOLN
INTRAMUSCULAR | Status: AC
  Administered 2021-01-12: 25 ug via INTRAVENOUS
  Filled 2021-01-12: qty 2

## 2021-01-12 MED ORDER — DEXTROSE 5 % IV SOLN
INTRAVENOUS | Status: DC | PRN
  Administered 2021-01-12: 3 g via INTRAVENOUS

## 2021-01-12 MED ORDER — CEFAZOLIN SODIUM 1 G IJ SOLR
INTRAMUSCULAR | Status: AC
  Filled 2021-01-12: qty 30

## 2021-01-12 MED ORDER — ALPRAZOLAM 0.25 MG PO TABS: 0.2500 mg | ORAL_TABLET | Freq: Every day | ORAL | Status: DC | PRN

## 2021-01-12 MED ORDER — SENNA 8.6 MG PO TABS
1.0000 | ORAL_TABLET | Freq: Two times a day (BID) | ORAL | Status: DC
  Administered 2021-01-12 – 2021-01-14 (×4): 8.6 mg via ORAL
  Filled 2021-01-12 (×4): qty 1

## 2021-01-12 MED ORDER — PROPOFOL 10 MG/ML IV BOLUS
INTRAVENOUS | Status: DC | PRN
  Administered 2021-01-12: 170 mg via INTRAVENOUS

## 2021-01-12 MED ORDER — ACETAMINOPHEN 500 MG PO TABS: 1000.0000 mg | ORAL_TABLET | Freq: Four times a day (QID) | ORAL | Status: DC

## 2021-01-12 MED ORDER — DEXMEDETOMIDINE (PRECEDEX) IN NS 20 MCG/5ML (4 MCG/ML) IV SYRINGE
PREFILLED_SYRINGE | INTRAVENOUS | Status: DC | PRN
  Administered 2021-01-12: 4 ug via INTRAVENOUS
  Administered 2021-01-12 (×2): 8 ug via INTRAVENOUS

## 2021-01-12 MED ORDER — ACETAMINOPHEN 500 MG PO TABS
1000.0000 mg | ORAL_TABLET | Freq: Four times a day (QID) | ORAL | Status: AC
  Administered 2021-01-13 (×3): 1000 mg via ORAL
  Filled 2021-01-12 (×5): qty 2

## 2021-01-12 MED ORDER — PHENYLEPHRINE HCL (PRESSORS) 10 MG/ML IV SOLN
INTRAVENOUS | Status: DC | PRN
  Administered 2021-01-12 (×3): 100 ug via INTRAVENOUS

## 2021-01-12 MED ORDER — FENTANYL CITRATE (PF) 100 MCG/2ML IJ SOLN
25.0000 ug | INTRAMUSCULAR | Status: AC | PRN
  Administered 2021-01-12 (×4): 25 ug via INTRAVENOUS

## 2021-01-12 MED ORDER — HYDROMORPHONE HCL 1 MG/ML IJ SOLN: 0.5000 mg | INTRAMUSCULAR | Status: DC | PRN

## 2021-01-12 MED ORDER — MAGNESIUM CITRATE PO SOLN
1.0000 | Freq: Once | ORAL | Status: DC | PRN
  Filled 2021-01-12: qty 296

## 2021-01-12 MED ORDER — ACETAMINOPHEN 10 MG/ML IV SOLN
INTRAVENOUS | Status: DC | PRN
  Administered 2021-01-12: 1000 mg via INTRAVENOUS

## 2021-01-12 MED ORDER — MIDAZOLAM HCL 2 MG/2ML IJ SOLN
INTRAMUSCULAR | Status: DC | PRN
  Administered 2021-01-12: 2 mg via INTRAVENOUS

## 2021-01-12 MED ORDER — MIDAZOLAM HCL 2 MG/2ML IJ SOLN
INTRAMUSCULAR | Status: AC
  Filled 2021-01-12: qty 2

## 2021-01-12 MED ORDER — ONDANSETRON HCL 4 MG/2ML IJ SOLN
INTRAMUSCULAR | Status: DC | PRN
  Administered 2021-01-12: 4 mg via INTRAVENOUS

## 2021-01-12 MED ORDER — BUPIVACAINE HCL 0.25 % IJ SOLN
INTRAMUSCULAR | Status: DC | PRN
  Administered 2021-01-12: 30 mL

## 2021-01-12 MED ORDER — KETOROLAC TROMETHAMINE 30 MG/ML IJ SOLN
INTRAMUSCULAR | Status: DC | PRN
  Administered 2021-01-12: 30 mg via INTRAVENOUS

## 2021-01-12 MED ORDER — ONDANSETRON HCL 4 MG PO TABS: 4.0000 mg | ORAL_TABLET | Freq: Four times a day (QID) | ORAL | Status: DC | PRN

## 2021-01-12 MED ORDER — LIDOCAINE HCL (CARDIAC) PF 100 MG/5ML IV SOSY
PREFILLED_SYRINGE | INTRAVENOUS | Status: DC | PRN
  Administered 2021-01-12: 60 mg via INTRAVENOUS

## 2021-01-12 MED ORDER — TRAMADOL HCL 50 MG PO TABS
50.0000 mg | ORAL_TABLET | Freq: Four times a day (QID) | ORAL | Status: DC
Start: 2021-01-12 — End: 2021-01-14
  Administered 2021-01-13 – 2021-01-14 (×7): 50 mg via ORAL
  Filled 2021-01-12 (×7): qty 1

## 2021-01-12 MED ORDER — CEFAZOLIN SODIUM-DEXTROSE 2-4 GM/100ML-% IV SOLN
2.0000 g | Freq: Four times a day (QID) | INTRAVENOUS | Status: AC
  Administered 2021-01-12 – 2021-01-13 (×2): 2 g via INTRAVENOUS
  Filled 2021-01-12 (×2): qty 100

## 2021-01-12 MED ORDER — ACETAMINOPHEN 10 MG/ML IV SOLN
INTRAVENOUS | Status: AC
  Filled 2021-01-12: qty 100

## 2021-01-12 MED ORDER — KETOROLAC TROMETHAMINE 30 MG/ML IJ SOLN
INTRAMUSCULAR | Status: AC
  Filled 2021-01-12: qty 1

## 2021-01-12 MED ORDER — OXYCODONE HCL 5 MG PO TABS
5.0000 mg | ORAL_TABLET | ORAL | Status: DC | PRN
  Administered 2021-01-12: 10 mg via ORAL
  Administered 2021-01-13 – 2021-01-14 (×3): 5 mg via ORAL
  Filled 2021-01-12 (×3): qty 1
  Filled 2021-01-12: qty 2

## 2021-01-12 MED ORDER — ENOXAPARIN SODIUM 40 MG/0.4ML IJ SOSY
40.0000 mg | PREFILLED_SYRINGE | INTRAMUSCULAR | Status: DC
  Administered 2021-01-13 – 2021-01-14 (×2): 40 mg via SUBCUTANEOUS
  Filled 2021-01-12 (×2): qty 0.4

## 2021-01-12 MED ORDER — DEXAMETHASONE SODIUM PHOSPHATE 10 MG/ML IJ SOLN
INTRAMUSCULAR | Status: DC | PRN
  Administered 2021-01-12: 10 mg via INTRAVENOUS

## 2021-01-12 MED ORDER — BISACODYL 10 MG RE SUPP: 10.0000 mg | Freq: Every day | RECTAL | Status: DC | PRN

## 2021-01-12 MED ORDER — PROMETHAZINE HCL 25 MG/ML IJ SOLN: 6.2500 mg | INTRAMUSCULAR | Status: DC | PRN

## 2021-01-12 MED ORDER — LIDOCAINE HCL (PF) 2 % IJ SOLN
INTRAMUSCULAR | Status: AC
  Filled 2021-01-12: qty 4

## 2021-01-12 MED ORDER — NEOMYCIN-POLYMYXIN B GU 40-200000 IR SOLN
Status: DC | PRN
  Administered 2021-01-12: 4 mL

## 2021-01-12 MED ORDER — ALUM & MAG HYDROXIDE-SIMETH 200-200-20 MG/5ML PO SUSP: 30.0000 mL | ORAL | Status: DC | PRN

## 2021-01-12 SURGICAL SUPPLY — 60 items
BIT DRILL 2.5X110 QC LCP DISP (BIT) ×1 IMPLANT
BIT DRILL LONG 2.7 (BIT) IMPLANT
BLADE SURG 15 STRL LF DISP TIS (BLADE) ×1 IMPLANT
BLADE SURG 15 STRL SS (BLADE) ×1
BNDG COHESIVE 4X5 TAN STRL (GAUZE/BANDAGES/DRESSINGS) ×2 IMPLANT
BNDG ELASTIC 4X5.8 VLCR NS LF (GAUZE/BANDAGES/DRESSINGS) ×4 IMPLANT
BNDG ESMARK 6X12 TAN STRL LF (GAUZE/BANDAGES/DRESSINGS) ×2 IMPLANT
COVER WAND RF STERILE (DRAPES) ×2 IMPLANT
CUFF TOURN SGL QUICK 34 (TOURNIQUET CUFF) ×1
CUFF TRNQT CYL 34X4.125X (TOURNIQUET CUFF) IMPLANT
DRAPE FLUOR MINI C-ARM 54X84 (DRAPES) ×2 IMPLANT
DRAPE INCISE IOBAN 66X45 STRL (DRAPES) ×2 IMPLANT
DRAPE U-SHAPE 47X51 STRL (DRAPES) ×2 IMPLANT
DRILL BIT LONG 2.7 (BIT) ×2
DRSG AQUACEL AG ADV 3.5X 4 (GAUZE/BANDAGES/DRESSINGS) ×1 IMPLANT
DURAPREP 26ML APPLICATOR (WOUND CARE) ×4 IMPLANT
ELECT CAUTERY BLADE 6.4 (BLADE) ×1 IMPLANT
ELECT REM PT RETURN 9FT ADLT (ELECTROSURGICAL) ×2
ELECTRODE REM PT RTRN 9FT ADLT (ELECTROSURGICAL) ×1 IMPLANT
GAUZE SPONGE 4X4 12PLY STRL (GAUZE/BANDAGES/DRESSINGS) ×2 IMPLANT
GAUZE XEROFORM 1X8 LF (GAUZE/BANDAGES/DRESSINGS) ×3 IMPLANT
GLOVE SURG 9.0 ORTHO LTXF (GLOVE) ×4 IMPLANT
GLOVE SURG UNDER POLY LF SZ9 (GLOVE) ×2 IMPLANT
GOWN STRL REUS TWL 2XL XL LVL4 (GOWN DISPOSABLE) ×2 IMPLANT
GOWN STRL REUS W/ TWL LRG LVL3 (GOWN DISPOSABLE) ×1 IMPLANT
GOWN STRL REUS W/TWL LRG LVL3 (GOWN DISPOSABLE) ×1
GUIDEWARE NON THREAD 1.25X150 (WIRE) ×8
GUIDEWIRE NON THREAD 1.25X150 (WIRE) IMPLANT
IMPL TIGHTROP W/DRV K-LESS (Anchor) IMPLANT
IMPLANT TIGHTROPE W/DRV K-LESS (Anchor) ×4 IMPLANT
KIT TURNOVER KIT A (KITS) ×2 IMPLANT
LABEL OR SOLS (LABEL) ×2 IMPLANT
MANIFOLD NEPTUNE II (INSTRUMENTS) ×2 IMPLANT
NS IRRIG 1000ML POUR BTL (IV SOLUTION) ×2 IMPLANT
PACK EXTREMITY ARMC (MISCELLANEOUS) ×2 IMPLANT
PAD ABD DERMACEA PRESS 5X9 (GAUZE/BANDAGES/DRESSINGS) ×4 IMPLANT
PAD CAST CTTN 4X4 STRL (SOFTGOODS) ×3 IMPLANT
PADDING CAST COTTON 4X4 STRL (SOFTGOODS) ×3
PLATE LCP 3.5 1/3 TUB 5HX57 (Plate) ×1 IMPLANT
SCREW CANC FT ST SFS 4X16 (Screw) ×2 IMPLANT
SCREW CANN L THRD/32 4.0 (Orthopedic Implant) ×1 IMPLANT
SCREW CORTEX 3.5 14MM (Screw) ×1 IMPLANT
SCREW LOCK CORT ST 3.5X14 (Screw) IMPLANT
SCREW SHORT THREAD 4.0X32 (Screw) ×1 IMPLANT
SCREW SHORT THREAD 4.0X40 (Screw) ×1 IMPLANT
SCREW SHORT THREAD 4.0X42 (Screw) ×1 IMPLANT
SPLINT CAST 1 STEP 4X30 (MISCELLANEOUS) ×4 IMPLANT
SPONGE LAP 18X18 RF (DISPOSABLE) ×2 IMPLANT
STAPLER SKIN PROX 35W (STAPLE) ×2 IMPLANT
STOCKINETTE STRL 6IN 960660 (GAUZE/BANDAGES/DRESSINGS) ×2 IMPLANT
STRIP CLOSURE SKIN 1/2X4 (GAUZE/BANDAGES/DRESSINGS) ×4 IMPLANT
SUT ETHILON 4-0 (SUTURE) ×1
SUT ETHILON 4-0 FS2 18XMFL BLK (SUTURE) ×1
SUT VIC AB 0 CT1 36 (SUTURE) ×1 IMPLANT
SUT VIC AB 2-0 SH 27 (SUTURE) ×3
SUT VIC AB 2-0 SH 27XBRD (SUTURE) ×2 IMPLANT
SUTURE ETHLN 4-0 FS2 18XMF BLK (SUTURE) IMPLANT
SYR 30ML LL (SYRINGE) ×2 IMPLANT
TAPE PAPER 2X10 WHT MICROPORE (GAUZE/BANDAGES/DRESSINGS) ×2 IMPLANT
TOWEL OR 17X26 4PK STRL BLUE (TOWEL DISPOSABLE) ×3 IMPLANT

## 2021-01-12 NOTE — Plan of Care (Signed)
  Problem: Education: Goal: Knowledge of General Education information will improve Description Including pain rating scale, medication(s)/side effects and non-pharmacologic comfort measures Outcome: Progressing   

## 2021-01-12 NOTE — Anesthesia Postprocedure Evaluation (Signed)
Anesthesia Post Note  Patient: Cynthia Lewis  Procedure(s) Performed: OPEN REDUCTION INTERNAL FIXATION (ORIF) ANKLE FRACTURE (Left Ankle)  Patient location during evaluation: PACU Anesthesia Type: General Level of consciousness: awake and alert Pain management: pain level controlled Vital Signs Assessment: post-procedure vital signs reviewed and stable Respiratory status: spontaneous breathing, nonlabored ventilation, respiratory function stable and patient connected to nasal cannula oxygen Cardiovascular status: blood pressure returned to baseline and stable Postop Assessment: no apparent nausea or vomiting Anesthetic complications: no   No complications documented.   Last Vitals:  Vitals:   01/12/21 2028 01/12/21 2208  BP: (!) 153/96 133/76  Pulse: 98 (!) 102  Resp: 17 17  Temp: 36.4 C 36.7 C  SpO2: 97% 92%    Last Pain:  Vitals:   01/12/21 2208  TempSrc: Oral  PainSc:                  Lenard Simmer

## 2021-01-12 NOTE — Anesthesia Preprocedure Evaluation (Addendum)
Anesthesia Evaluation  Patient identified by MRN, date of birth, ID band Patient awake    Reviewed: Allergy & Precautions, H&P , NPO status , Patient's Chart, lab work & pertinent test results, reviewed documented beta blocker date and time   History of Anesthesia Complications Negative for: history of anesthetic complications  Airway Mallampati: II  TM Distance: >3 FB Neck ROM: full    Dental  (+) Dental Advidsory Given   Pulmonary neg pulmonary ROS,    Pulmonary exam normal breath sounds clear to auscultation       Cardiovascular Exercise Tolerance: Good hypertension, (-) angina(-) Past MI and (-) Cardiac Stents Normal cardiovascular exam(-) dysrhythmias (-) Valvular Problems/Murmurs Rhythm:regular Rate:Normal     Neuro/Psych PSYCHIATRIC DISORDERS Anxiety negative neurological ROS     GI/Hepatic negative GI ROS, Neg liver ROS,   Endo/Other  neg diabetesMorbid obesity  Renal/GU negative Renal ROS  negative genitourinary   Musculoskeletal   Abdominal   Peds  Hematology negative hematology ROS (+)   Anesthesia Other Findings Past Medical History: No date: ADHD No date: Anxiety 2012: Hx of dysplastic nevus     Comment:  Left medial buttock  No date: Hypertension   Reproductive/Obstetrics negative OB ROS                            Anesthesia Physical Anesthesia Plan  ASA: III  Anesthesia Plan: General   Post-op Pain Management:    Induction: Intravenous  PONV Risk Score and Plan: 3 and Ondansetron, Dexamethasone, Midazolam, Promethazine and Treatment may vary due to age or medical condition  Airway Management Planned: LMA and Oral ETT  Additional Equipment:   Intra-op Plan:   Post-operative Plan: Extubation in OR  Informed Consent: I have reviewed the patients History and Physical, chart, labs and discussed the procedure including the risks, benefits and alternatives for  the proposed anesthesia with the patient or authorized representative who has indicated his/her understanding and acceptance.     Dental Advisory Given  Plan Discussed with: Anesthesiologist, CRNA and Surgeon  Anesthesia Plan Comments:        Anesthesia Quick Evaluation

## 2021-01-12 NOTE — Op Note (Signed)
01/12/2021  6:06 PM  PATIENT:  Cynthia Lewis    PRE-OPERATIVE DIAGNOSIS:  left ankle trimalleolar fracture equivalent with posterior dislocation  POST-OPERATIVE DIAGNOSIS:  Same  PROCEDURE: Open reduction internal fixation of left ankle medial and posterior malleolar fractures and syndesmotic fixation  SURGEON:  Thornton Park, MD  ANESTHESIA:   General  PREOPERATIVE INDICATIONS:  Cynthia Lewis is a  35 y.o. female with a diagnosis of left ankle fracture sustained yesterday from a twisting injury when she fell down a hill walking her dog.  X-rays and a CT scan of the ankle were taken demonstrating a trimalleolar ankle fracture equivalent with posterior dislocation.  A closed reduction was performed in the emergency department.  Patient was splinted and admitted to the hospital and scheduled for surgery given the instability of her left ankle.  I discussed the risks and benefits of surgery. The risks include but are not limited to infection, bleeding requiring blood transfusion, nerve or blood vessel injury, joint stiffness or loss of motion, persistent pain, weakness or instability, malunion, nonunion and hardware failure and the need for further surgery. Medical risks include but are not limited to DVT and pulmonary embolism, myocardial infarction, stroke, pneumonia, respiratory failure and death. Patient understood these risks and wished to proceed.   OPERATIVE IMPLANTS: Synthes 4.0 mm cannulated screws x3 for medial and posterior malleolar fracture fixation.  A Synthes 5 hole one third tubular plate with 1 proximal bicortical screw anterior distal cancellous screws with 2 Arthrex stainless steel ankle tight ropes for syndesmotic fixation.  OPERATIVE FINDINGS: Displaced medial malleolar fractures with syndesmotic disruption and instability of the distal tibiofibular joint.  OPERATIVE PROCEDURE:   Patient was met in the preoperative area. The left leg was signed my initials and the  word yes according the hospital's correct site of surgery protocol. She was brought to the operating room where she underwent general anesthesia. The patient was placed supine on the operative table. A bump was placed under the left hip. A tourniquet was applied to the left thigh.  The lower extremity was prepped and draped in a sterile fashion. A timeout was performed to verify the patient's name, date of birth, medical record number, correct site of surgery and correct procedure to be performed. It was also used to verify the patient received antibiotics, and that all appropriate instruments, implants and radiographic studies were available in the room. Once all in attendance were in agreement, the case began.  The left lower extremity was exsanguinated with an Esmarch. The tourniquet was inflated to 275 mmHg. This was applied for a total of 135 minutes.  A medial incision was made to expose the medial malleolus.  The medial malleolus had a oblique anterior avulsion fragment as well as a vertical component extending into the posterior malleolus.  Once the fractures were identified the posterior malleolar fracture extending vertically into the medial malleolus was addressed first.  Patient had her left foot dorsiflexed to reduce the fracture.  A K wire was then advanced from a posteromedial to anterolateral direction.  Its position was confirmed on FluoroScan imaging in both the AP and lateral planes.  The wire was then measured with a depth gauge and overdrilled to allow for placement of a 4.0 cannulated screw.  The final screw position was confirmed on AP and lateral FluoroScan imaging.  The CT scan had demonstrated a comminuted posterior malleolus fracture with a posterior lateral fragment as well.  A small incision was made anteriorly to allow for  placement of a K wire from anterior to posterior.  The lateral posterior malleolar fragment was palpated and reduced with a dental pick and the K wire was advanced  anterior to posterior.  The fracture reduction and K wire placement were confirmed on FluoroScan imaging in the AP and lateral planes.  The fracture was well reduced.  The K wire was overdrilled and a short threaded 4.0 mm cannulated screw, 42 mm in length, was advanced into position by hand.  Its final position was again confirmed on AP and lateral FluoroScan imaging.  Once the posterior malleolar fragments were fixed, attention was turned to the medial malleolus. The fracture of the medial malleolus was identified. This was reduced with a dental pick. A single threaded K wire was then advanced through the tip of the medial malleolus across the fracture site and into the distal tibia. The position of the K wires was evaluated on AP and lateral FluoroScan images. The length of the wire was measured with a depth gauge and were determined to be 32 mm in length. The wire was then overdrilled with a cannulated drill and the short threaded 4.0 mm cannulated screw was advanced into position by hand reducing the medial malleolus fracture.  Final FluoroScan images of this fracture reduction and screw placement were taken.  The attention was then turned to syndesmotic fixation.  A lateral incision was made over the fibula. The subcutaneous tissues were dissected with the Metzenbaum scissor and pickup. Care was taken to avoid injury to the superficial peroneal nerve.  The lateral malleolus was exposed.  A 5 hole one third tubular plate was contoured to the lateral malleolus.  The plate was fixed to the lateral malleolus using single 3.33m bicortical screw in the first hole proximally and 2 4.031mcancellous screws in the fourth and fifth holes distally.  The third and fourth holes were used to place two Arthrex stainless steel ankle tight ropes were then used through the second and third holes from the tunnel of the plate to stabilize the syndesmosis.  A K wire was placed through the third hole and the position was confirmed  on FluoroScan imaging.  The wire was parallel to the articular joint surface.  This K wire was then overdrilled.  An Arthrex tight rope was then passed across 4 cortices and out the medial cortex.  The button was flipped and countertraction was applied.  The tight rope was cinched to allow for stabilization of the syndesmosis.  The 1/3 tubular plate was used for countertraction.  A second Arthrex syndesmotic tight rope was used through the second hole parallel to the previous 1.  A stress test of the right ankle was then performed under fluoroscopy.  This test did not reveal any syndesmotic injury or opening of the medial clear space.  The medial and lateral incisions were then copiously irrigated. The subcutaneous tissue was closed with 2-0 Vicryl and the skin approximated staples. 0.25% Marcaine plain was injected into the incision sites to assist with postoperative pain control. A dry sterile dressing was applied along with an AO splint.  The patient's ankle was positioned in neutral. The pateint was then awoken from anesthesia, transferred to hospital bed and brought to the PACU in stable condition. I was scrubbed and present the entire case and all sharp and instrument counts were correct at conclusion the case. I [facility] to the patient's [facility] know the case was performed without complication and the patient  was stable in recovery room.    Timoteo Gaul, MD

## 2021-01-12 NOTE — Plan of Care (Signed)

## 2021-01-12 NOTE — Progress Notes (Signed)
Patient received 5mg  Roxi given about an hour ago and patient states pain is still sharp and constant, therefore gave prn 5mg  Roxi and Robaxim per PRN order.

## 2021-01-12 NOTE — Anesthesia Procedure Notes (Signed)
Procedure Name: LMA Insertion Date/Time: 01/12/2021 1:56 PM Performed by: Elmarie Mainland, CRNA Pre-anesthesia Checklist: Patient identified, Emergency Drugs available, Suction available and Patient being monitored Patient Re-evaluated:Patient Re-evaluated prior to induction Oxygen Delivery Method: Circle system utilized Preoxygenation: Pre-oxygenation with 100% oxygen Induction Type: IV induction Ventilation: Mask ventilation without difficulty LMA: LMA inserted LMA Size: 3.5 Number of attempts: 1 Placement Confirmation: positive ETCO2 and breath sounds checked- equal and bilateral Tube secured with: Tape Dental Injury: Teeth and Oropharynx as per pre-operative assessment

## 2021-01-12 NOTE — Transfer of Care (Signed)
Immediate Anesthesia Transfer of Care Note  Patient: Cynthia Lewis  Procedure(s) Performed: OPEN REDUCTION INTERNAL FIXATION (ORIF) ANKLE FRACTURE (Left Ankle)  Patient Location: PACU  Anesthesia Type:General  Level of Consciousness: drowsy and patient cooperative  Airway & Oxygen Therapy: Patient Spontanous Breathing and Patient connected to face mask oxygen  Post-op Assessment: Report given to RN and Post -op Vital signs reviewed and stable  Post vital signs: Reviewed and stable  Last Vitals:  Vitals Value Taken Time  BP 145/84 01/12/21 1725  Temp 36.6 C 01/12/21 1725  Pulse 83 01/12/21 1728  Resp 14 01/12/21 1728  SpO2 96 % 01/12/21 1728  Vitals shown include unvalidated device data.  Last Pain:  Vitals:   01/12/21 1205  TempSrc:   PainSc: 2       Patients Stated Pain Goal: 0 (01/12/21 1100)  Complications: No complications documented.

## 2021-01-12 NOTE — H&P (Addendum)
The patient has been re-examined, and the chart reviewed, and there have been no interval changes to the documented history and physical.    I have marked the left leg according to the hospital's correct site of surgery protocol.  The risks, benefits, and alternatives have been discussed at length, and the patient is willing to proceed.

## 2021-01-13 ENCOUNTER — Encounter: Payer: Self-pay | Admitting: Orthopedic Surgery

## 2021-01-13 MED ORDER — ASPIRIN EC 325 MG PO TBEC
325.0000 mg | DELAYED_RELEASE_TABLET | Freq: Two times a day (BID) | ORAL | 0 refills | Status: DC
Start: 2021-01-13 — End: 2021-04-16

## 2021-01-13 MED ORDER — BISACODYL 5 MG PO TBEC: 5.0000 mg | DELAYED_RELEASE_TABLET | Freq: Every day | ORAL | 0 refills | Status: DC | PRN

## 2021-01-13 MED ORDER — DOCUSATE SODIUM 100 MG PO CAPS: 100.0000 mg | ORAL_CAPSULE | Freq: Two times a day (BID) | ORAL | 0 refills | Status: DC

## 2021-01-13 MED ORDER — OXYCODONE HCL 5 MG PO TABS: 5.0000 mg | ORAL_TABLET | ORAL | 0 refills | Status: DC | PRN

## 2021-01-13 NOTE — Evaluation (Signed)
Physical Therapy Evaluation Patient Details Name: Cynthia Lewis MRN: [id number] DOB: 1986/01/31 Today's Date: 01/13/2021   History of Present Illness  35 y.o. female s/p L ankle ORIF of trimalleolar ankle fracture 2/2 fall while walking her dog. PMHx includes HTN, ADHD, and anxiety.    Clinical Impression  Pt alert, agreeable to PT, eager to mobilize. Pt reported at baseline she is independent. Plans on staying with her mother at discharge.  The patient was up in chair with OT assist at start of session, OT reported supervision for bed mobility. Sit <> stand with CGA and crutches after demonstration and verbal cues for technique. She performed hop to gait pattern with crutches and CGA ~76ft. Cued for rest break by PT twice (second to adjust crutch height). Pt with occasional mild unsteadiness but not LOB noted.  Overall the patient demonstrated deficits (see "PT Problem List") that impede the patient's functional abilities, safety, and mobility and would benefit from skilled PT intervention. Recommendation is to follow surgeon's recommendation for follow up therapies and intermittent supervision.      Follow Up Recommendations Follow surgeon's recommendation for DC plan and follow-up therapies;Supervision - Intermittent    Equipment Recommendations  Rolling walker with 5" wheels;3in1 (PT)    Recommendations for Other Services       Precautions / Restrictions Precautions Precautions: Fall Restrictions Weight Bearing Restrictions: Yes LLE Weight Bearing: Non weight bearing      Mobility  Bed Mobility Overal bed mobility: Needs Assistance Bed Mobility: Supine to Sit     Supine to sit: Supervision     General bed mobility comments: Per OT supervision for bed mobility    Transfers Overall transfer level: Needs assistance Equipment used: Crutches Transfers: Sit to/from Stand Sit to Stand: Min guard         General transfer comment: cues for hand/foot  placement  Ambulation/Gait Ambulation/Gait assistance: Min guard Gait Distance (Feet): 60 Feet Assistive device: Crutches       General Gait Details: 1-2 standing rest breaks led by PT, to allow pt rest and then adjust crutches height  Stairs            Wheelchair Mobility    Modified Rankin (Stroke Patients Only)       Balance Overall balance assessment: Needs assistance Sitting-balance support: No upper extremity supported;Feet supported Sitting balance-Leahy Scale: Good     Standing balance support: Bilateral upper extremity supported Standing balance-Leahy Scale: Fair Standing balance comment: able to stand statically without support momentarily, but improved safety/comfort with at least unilateral UE support                             Pertinent Vitals/Pain Pain Assessment: 0-10 Pain Score: 2  Pain Location: LLE Pain Descriptors / Indicators: Aching Pain Intervention(s): Limited activity within patient's tolerance;Monitored during session;Premedicated before session;Repositioned    Home Living Family/patient expects to be discharged to:: Private residence Living Arrangements: Alone Available Help at Discharge: Family;Available 24 hours/day Type of Home: House Home Access: Stairs to enter Entrance Stairs-Rails: None Entrance Stairs-Number of Steps: 2 Home Layout: One level Home Equipment: None Additional Comments: Above home set up for mother's home where she will be staying initially    Prior Function Level of Independence: Independent         Comments: Pt independent, 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, no other falls     Hand Dominance   Dominant  Hand: Right    Extremity/Trunk Assessment   Upper Extremity Assessment Upper Extremity Assessment: Overall WFL for tasks assessed    Lower Extremity Assessment Lower Extremity Assessment: RLE deficits/detail RLE Deficits / Details: WFLs LLE Deficits /  Details: NWBing LLE: Unable to fully assess due to immobilization    Cervical / Trunk Assessment Cervical / Trunk Assessment: Normal  Communication   Communication: No difficulties  Cognition Arousal/Alertness: Awake/alert Behavior During Therapy: WFL for tasks assessed/performed Overall Cognitive Status: Within Functional Limits for tasks assessed                                        General Comments      Exercises Other Exercises Other Exercises: Pt educated on crutch training, potential use of knee scooter if cleared by MD, and some stair training education reviewed   Assessment/Plan    PT Assessment Patient needs continued PT services  PT Problem List Decreased strength;Decreased range of motion;Decreased activity tolerance;Decreased balance;Decreased mobility;Decreased knowledge of use of DME;Pain       PT Treatment Interventions DME instruction;Balance training;Gait training;Neuromuscular re-education;Stair training;Functional mobility training;Patient/family education;Therapeutic activities;Therapeutic exercise    PT Goals (Current goals can be found in the Care Plan section)  Acute Rehab PT Goals Patient Stated Goal: go home with parent and recover PT Goal Formulation: With patient Time For Goal Achievement: 01/27/21 Potential to Achieve Goals: Good    Frequency BID   Barriers to discharge        Co-evaluation               AM-PAC PT "6 Clicks" Mobility  Outcome Measure Help needed turning from your back to your side while in a flat bed without using bedrails?: None Help needed moving from lying on your back to sitting on the side of a flat bed without using bedrails?: None Help needed moving to and from a bed to a chair (including a wheelchair)?: None Help needed standing up from a chair using your arms (e.g., wheelchair or bedside chair)?: None Help needed to walk in hospital room?: None Help needed climbing 3-5 steps with a  railing? : A Little 6 Click Score: 23    End of Session Equipment Utilized During Treatment: Gait belt Activity Tolerance: Patient tolerated treatment well Patient left: in chair;with call bell/phone within reach Nurse Communication: Mobility status PT Visit Diagnosis: Other abnormalities of gait and mobility (R26.89);Muscle weakness (generalized) (M62.81);Difficulty in walking, not elsewhere classified (R26.2)    Time: 8250-0370 PT Time Calculation (min) (ACUTE ONLY): 20 min   Charges:   PT Evaluation $PT Eval Low Complexity: 1 Low PT Treatments $Gait Training: 8-22 mins       Olga Coaster PT, DPT 10:23 AM,01/13/21

## 2021-01-13 NOTE — Discharge Summary (Addendum)
Physician Discharge Summary  Patient ID: Cynthia Lewis MRN: [id number] DOB/AGE: [birth year] 35 y.o.  Admit date: 01/11/2021 Discharge date: 01/14/2021  Admission Diagnoses:  left ankle fracture dislocation <principal problem not specified>  Discharge Diagnoses:  left ankle fracture dislocation Active Problems:   Closed left ankle fracture   Past Medical History:  Diagnosis Date  . ADHD   . Anxiety   . Hx of dysplastic nevus 2012   Left medial buttock   . Hypertension     Surgeries: Procedure(s): OPEN REDUCTION INTERNAL FIXATION (ORIF) ANKLE FRACTURE on 01/12/2021   Consultants (if any):   Discharged Condition: Improved  Hospital Course: Cynthia Lewis is an 35 y.o. female who was admitted 01/11/2021 with a diagnosis of  left ankle fracture <principal problem not specified> and went to the operating room on 01/12/2021 and underwent an uncomplicated ORIF of the left ankle.    She was given perioperative antibiotics:  Anti-infectives (From admission, onward)   Start     Dose/Rate Route Frequency Ordered Stop   01/12/21 2000  ceFAZolin (ANCEF) IVPB 2g/100 mL premix        2 g 200 mL/hr over 30 Minutes Intravenous Every 6 hours 01/12/21 1902 01/13/21 0551   01/11/21 1432  ceFAZolin (ANCEF) IVPB 2g/100 mL premix  Status:  Discontinued        2 g 200 mL/hr over 30 Minutes Intravenous 30 min pre-op 01/11/21 1433 01/12/21 1910    .  She was given sequential compression devices, early ambulation, and lovenox for DVT prophylaxis.  Patient was admitted postoperatively for neurovascular monitoring and pain control.  She benefited maximally from the hospital stay and there were no complications.    Recent vital signs:  Vitals:   01/13/21 1226 01/13/21 1459  BP: 130/89 (!) 147/95  Pulse: (!) 107 (!) 108  Resp: 18 17  Temp: 98.1 F (36.7 C) 98 F (36.7 C)  SpO2: 100% 99%    Recent laboratory studies:  Lab Results  Component Value Date   HGB 14.6 01/11/2021   HGB  11.5 03/18/2018   HGB 15.1 11/14/2013   Lab Results  Component Value Date   WBC 16.5 (H) 01/11/2021   PLT 301 01/11/2021   Lab Results  Component Value Date   INR 1.0 01/11/2021   Lab Results  Component Value Date   NA 137 01/11/2021   K 4.1 01/11/2021   CL 104 01/11/2021   CO2 24 01/11/2021   BUN 13 01/11/2021   CREATININE 0.93 01/11/2021   GLUCOSE 132 (H) 01/11/2021    Discharge Medications:   Allergies as of 01/13/2021      Reactions   Amoxicillin Nausea And Vomiting      Medication List    TAKE these medications   ALPRAZolam 0.25 MG tablet Commonly known as: XANAX TAKE 1 TABLET(0.25 MG) BY MOUTH ONCE DAILY  AS NEEDED FOR ANXIETY What changed:   how much to take  how to take this  when to take this  reasons to take this  additional instructions   aspirin EC 325 MG tablet Take 1 tablet (325 mg total) by mouth 2 (two) times daily.   bisacodyl 5 MG EC tablet Commonly known as: DULCOLAX Take 1 tablet (5 mg total) by mouth daily as needed for moderate constipation.   citalopram 20 MG tablet Commonly known as: CELEXA Take 1 tablet (20 mg total) by mouth daily.   docusate sodium 100 MG capsule Commonly known as: COLACE Take 1 capsule (100 mg  total) by mouth 2 (two) times daily.   levonorgestrel 20 MCG/24HR IUD Commonly known as: MIRENA 1 each by Intrauterine route once.   lisdexamfetamine 40 MG capsule Commonly known as: Vyvanse Take 1 capsule (40 mg total) by mouth daily.       ondansetron 4 MG disintegrating tablet Commonly known as: ZOFRAN-ODT DISSOLVE 1 TABLET BY MOUTH EVERY 8 HOURS AS NEEDED FOR NAUSEA OR VOMITING What changed: See the new instructions.   oxyCODONE 5 MG immediate release tablet Commonly known as: Oxy IR/ROXICODONE Take 1 tablet (5 mg total) by mouth every 4 (four) hours as needed for moderate pain (pain score 4-6).            Durable Medical Equipment  (From admission, onward)         Start     Ordered    01/13/21 1210  For home use only DME Crutches  Once        01/13/21 1209          Diagnostic Studies: DG Ankle Complete Left  Result Date: 01/12/2021 CLINICAL DATA:  Postop. EXAM: LEFT ANKLE COMPLETE - 3+ VIEW COMPARISON:  Preoperative radiograph yesterday. FINDINGS: Lateral plate and multi screw fixation of distal fibula. 3 screws traverse the distal tibia. Syndesmotic trapeze. Ankle mortise is congruent. Recent postsurgical change with soft tissue edema and skin staples in place. Overlying splint in place. IMPRESSION: ORIF ankle fracture dislocation without immediate postoperative complication. Electronically Signed   By: Narda Rutherford M.D.   On: 01/12/2021 18:28   DG Ankle Complete Left  Result Date: 01/11/2021 CLINICAL DATA:  Fall, ankle pain while walking dog this morning. EXAM: LEFT ANKLE COMPLETE - 3+ VIEW COMPARISON:  Foot evaluation from Jan 02, 2020. FINDINGS: Fracture dislocation of the LEFT ankle with syndesmotic widening, fracture of the medial malleolus and posterior tibia with posterior translation of the talus relative to the tibia. Soft tissue swelling about the ankle. No visible lateral malleolar fracture. IMPRESSION: Fracture dislocation of the LEFT ankle with posterior translation of the talus relative to the tibia, medial malleolar fracture and "posterior malleolus fracture". May be functionally similar to a trimalleolar fracture given syndesmotic widening. CT may be helpful for further evaluation as warranted. Electronically Signed   By: Donzetta Kohut M.D.   On: 01/11/2021 12:45   CT Ankle Left Wo Contrast  Result Date: 01/11/2021 CLINICAL DATA:  Left ankle fracture-dislocation status post reduction EXAM: CT OF THE LEFT ANKLE WITHOUT CONTRAST TECHNIQUE: Multidetector CT imaging of the left ankle was performed according to the standard protocol. Multiplanar CT image reconstructions were also generated. COMPARISON:  Same day radiographs FINDINGS: Bones/Joint/Cartilage Acute  comminuted bimalleolar left ankle fracture-dislocation status post reduction. Improved alignment of the ankle mortise. Comminuted medial malleolar fracture including transversely oriented component through the base of the medial malleolus is slightly displaced posteromedially. Comminuted vertically oriented component through the posterior malleolus extending through the medial malleolus and into the distal tibiofibular joint (series 4, image 96). There is mild posterosuperior displacement of the posterior malleolus. Mild articular surface depression with multiple small comminuted fracture fragments along the fracture site as well as a few tiny intra-articular fracture fragments within the ankle joint displaced anteriorly. There is a 4 mm bony avulsion fragment emanating from the anterolateral rim of the distal tibia which is laterally displaced by approximately 9 mm, likely reflecting anterior tibiofibular ligament avulsion injury (series 4, image 98). Distal fibula is intact without fracture. Distal tibiofibular joint alignment is maintained. Talus intact without evidence of  fracture. Calcaneus intact. Bones of the midfoot are intact. TMT joint alignment is maintained. No significant arthropathy. Ligaments Suboptimally assessed by CT. Muscles and Tendons No acute musculotendinous injury by CT. No evidence to suggest tendinous entrapment. Soft tissues Diffuse soft tissue swelling. Ill-defined hematoma along the anterior aspect of the ankle. IMPRESSION: 1. Acute comminuted bimalleolar left ankle fracture-dislocation status post reduction, as described above. Improved alignment of the ankle mortise. 2. Diffuse soft tissue swelling and ill-defined hematoma along the anterior aspect of the ankle. Electronically Signed   By: Duanne GuessNicholas  Plundo D.O.   On: 01/11/2021 15:45   DG MINI C-ARM IMAGE ONLY  Result Date: 01/12/2021 There is no interpretation for this exam.  This order is for images obtained during a surgical  procedure.  Please See "Surgeries" Tab for more information regarding the procedure.    Disposition: Discharge disposition: 01-Home or Self Care       Discharge Instructions    Call MD / Call 911   Complete by: As directed    If you experience chest pain or shortness of breath, CALL 911 and be transported to the hospital emergency room.  If you develope a fever above 101 F, pus (white drainage) or increased drainage or redness at the wound, or calf pain, call your surgeon's office.   Constipation Prevention   Complete by: As directed    Drink plenty of fluids.  Prune juice may be helpful.  You may use a stool softener, such as Colace (over the counter) 100 mg twice a day.  Use MiraLax (over the counter) for constipation as needed.   Diet general   Complete by: As directed    Discharge instructions   Complete by: As directed    Continue to elevate the left lower extremity at all times on pillows.  Keep the splint clean and dry.  Cover the splint with a plastic bag and elastic for showering.  Patient may benefit from sitting in the shower to avoid having to put weight on the left lower extremity.  Patient may not place any weight on the left lower extremity and should use crutches or scooter to get around.  Patient should take enteric-coated aspirin 325 mg twice a day to prevent blood clots.  Patient will be discharged on oxycodone for pain and should take a stool softener and laxative to prevent constipation.  Patient will follow up with Dr. Martha ClanKrasinski in the office in 10 to 14 days for follow-up, wound check, staple removal and cast placement.   Driving restrictions   Complete by: As directed    No driving until follow-up in the office in 2 weeks.   Increase activity slowly as tolerated   Complete by: As directed    Lifting restrictions   Complete by: As directed    No lifting for 12-16 weeks   Post-operative opioid taper instructions:   Complete by: As directed    POST-OPERATIVE  OPIOID TAPER INSTRUCTIONS: It is important to wean off of your opioid medication as soon as possible. If you do not need pain medication after your surgery it is ok to stop day one. Opioids include: Codeine, Hydrocodone(Norco, Vicodin), Oxycodone(Percocet, oxycontin) and hydromorphone amongst others.  Long term and even short term use of opiods can cause: Increased pain response Dependence Constipation Depression Respiratory depression And more.  Withdrawal symptoms can include Flu like symptoms Nausea, vomiting And more Techniques to manage these symptoms Hydrate well Eat regular healthy meals Stay active Use relaxation techniques(deep breathing, meditating, yoga)  Do Not substitute Alcohol to help with tapering If you have been on opioids for less than two weeks and do not have pain than it is ok to stop all together.  Plan to wean off of opioids This plan should start within one week post op of your joint replacement. Maintain the same interval or time between taking each dose and first decrease the dose.  Cut the total daily intake of opioids by one tablet each day Next start to increase the time between doses. The last dose that should be eliminated is the evening dose.            Signed: Juanell Fairly ,MD 01/13/2021, 4:33 PM

## 2021-01-13 NOTE — TOC Progression Note (Signed)
Transition of Care [facility]) - Progression Note    Patient Details  Name: Cynthia Lewis MRN: [id number] Date of Birth: 1986-08-06  Transition of Care [facility]) CM/SW Contact  Barrie Dunker, RN Phone Number: 01/13/2021, 1:19 PM  Clinical Narrative:    Notified [employer] that the patient needs crutches and a 3 in 1, it will be brought to the room prior to dc        Expected Discharge Plan and Services                                                 Social Determinants of Health (SDOH) Interventions    Readmission Risk Interventions No flowsheet data found.

## 2021-01-13 NOTE — TOC Progression Note (Signed)
Transition of Care [facility]) - Progression Note    Patient Details  Name: Cynthia Lewis MRN: [id number] Date of Birth: Mar 30, 1986  Transition of Care [facility]) CM/SW [place], RN Phone Number: 01/13/2021, 1:51 PM  Clinical Narrative:    Met with the patient to discuss DC plan and needs She needs crutches and a 3 in 1 This is provided and brought to the room for her to take home by [employer] She will not have [facility] services She has transportation with family She can afford her medications        Expected Discharge Plan and Services                                                 Social Determinants of Health (SDOH) Interventions    Readmission Risk Interventions No flowsheet data found.

## 2021-01-13 NOTE — Evaluation (Signed)
m  Occupational Therapy Evaluation Patient Details Name: Cynthia Lewis MRN: [id number] DOB: 1985-12-09 Today's Date: 01/13/2021    History of Present Illness 35 y.o. female s/p L ankle ORIF of trimalleolar ankle fracture 2/2 fall while walking her dog. PMHx includes HTN, ADHD, and anxiety.   Clinical Impression   Pt was seen for OT evaluation this date. Prior to hospital admission, pt was independent, living by herself with her dog Boyd Kerbs, and working from home. Pt alert and oriented, agreeable to therapy. Pt reports plan to stay with mother after discharge initially. Pt able to perform bed mobility with supervision, stands with CGA +RW and able to get to [facility] while maintaining NWBing through LLE. Pt endorses mild pain throughout, premedicated. Pt educated in falls prevention, benefits of BSC for toileting needs at her mother's, and pet care considerations. Currently pt demonstrates impairments as described below (See OT problem list) which functionally limit her ability to perform ADL/self-care tasks. Pt currently requires PRN MIN A for LB ADL and CGA for ADL transfers with use of RW to maintain LLE NWBing.  Pt would benefit from skilled OT services to address noted impairments and functional limitations (see below for any additional details) in order to maximize safety and independence while minimizing falls risk and caregiver burden. Do not anticipate skilled OT needs following hospitalization.     Follow Up Recommendations  No OT follow up    Equipment Recommendations  3 in 1 bedside commode    Recommendations for Other Services       Precautions / Restrictions Precautions Precautions: Fall Restrictions Weight Bearing Restrictions: Yes LLE Weight Bearing: Non weight bearing      Mobility Bed Mobility Overal bed mobility: Needs Assistance Bed Mobility: Supine to Sit     Supine to sit: Supervision          Transfers Overall transfer level: Needs assistance Equipment  used: Rolling walker (2 wheeled) Transfers: Sit to/from Stand Sit to Stand: Min guard         General transfer comment: cues for hand/foot placement    Balance Overall balance assessment: Needs assistance Sitting-balance support: No upper extremity supported;Feet supported Sitting balance-Leahy Scale: Good     Standing balance support: Bilateral upper extremity supported Standing balance-Leahy Scale: Fair                             ADL either performed or assessed with clinical judgement   ADL Overall ADL's : Needs assistance/impaired                                       General ADL Comments: Pt requires MIN A for LB ADL, set up and supervision for pericare, CGA for ADL transfers, MOD A for compression stocking mgt     Vision Baseline Vision/History: Wears glasses Wears Glasses: At all times Patient Visual Report: No change from baseline       Perception     Praxis      Pertinent Vitals/Pain Pain Assessment: 0-10 Pain Score: 2  Pain Location: LLE Pain Descriptors / Indicators: Aching Pain Intervention(s): Limited activity within patient's tolerance;Monitored during session;Premedicated before session;Repositioned     Hand Dominance Right   Extremity/Trunk Assessment Upper Extremity Assessment Upper Extremity Assessment: Overall WFL for tasks assessed   Lower Extremity Assessment Lower Extremity Assessment: LLE deficits/detail LLE Deficits / Details: NWBing  LLE: Unable to fully assess due to immobilization   Cervical / Trunk Assessment Cervical / Trunk Assessment: Normal   Communication Communication Communication: No difficulties   Cognition Arousal/Alertness: Awake/alert Behavior During Therapy: WFL for tasks assessed/performed Overall Cognitive Status: Within Functional Limits for tasks assessed                                     General Comments       Exercises Other Exercises Other Exercises: Pt  educated in falls prevention, pet care considerations, LLE precautions   Shoulder Instructions      Home Living Family/patient expects to be discharged to:: Private residence Living Arrangements: Alone Available Help at Discharge: Family;Available 24 hours/day Type of Home: House Home Access: Stairs to enter [employer] of Steps: 2 Entrance Stairs-Rails: None Home Layout: One level     Bathroom Shower/Tub: Chief Strategy Officer: Standard     Home Equipment: None   Additional Comments: Above home set up for mother's home where she will be staying initially      Prior Functioning/Environment Level of Independence: Independent        Comments: Pt independent, 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, no other falls        OT Problem List: Decreased strength;Pain;Decreased range of motion;Impaired balance (sitting and/or standing);Decreased knowledge of use of DME or AE      OT Treatment/Interventions: Self-care/ADL training;Therapeutic exercise;Therapeutic activities;DME and/or AE instruction;Patient/family education;Balance training    OT Goals(Current goals can be found in the care plan section) Acute Rehab OT Goals Patient Stated Goal: go home with parent and recover OT Goal Formulation: With patient Time For Goal Achievement: 01/27/21 Potential to Achieve Goals: Good ADL Goals Pt Will Perform Lower Body Dressing: with modified independence;sit to/from stand Pt Will Transfer to Toilet: with modified independence;ambulating (BSC over toilet, LRAD for amb, LLE NWBing) Additional ADL Goal #1: Pt will independently instruct family in compression stocking mgt  OT Frequency: Min 1X/week   Barriers to D/C:            Co-evaluation              AM-PAC OT "6 Clicks" Daily Activity     Outcome Measure Help from another person eating meals?: None Help from another person taking care of personal grooming?: None Help from  another person toileting, which includes using toliet, bedpan, or urinal?: A Little Help from another person bathing (including washing, rinsing, drying)?: A Little Help from another person to put on and taking off regular upper body clothing?: None Help from another person to put on and taking off regular lower body clothing?: A Little 6 Click Score: 21   End of Session Equipment Utilized During Treatment: Rolling walker  Activity Tolerance: Patient tolerated treatment well Patient left: in chair;with call bell/phone within reach;Other (comment) (with PT)  OT Visit Diagnosis: Other abnormalities of gait and mobility (R26.89);History of falling (Z91.81);Pain Pain - Right/Left: Left Pain - part of body: Ankle and joints of foot                Time: 7846-9629 OT Time Calculation (min): 23 min Charges:  OT General Charges $OT Visit: 1 Visit OT Evaluation $OT Eval Low Complexity: 1 Low OT Treatments $Self Care/Home Management : 8-22 mins  Wynona Canes, MPH, MS, OTR/L ascom [phone] 01/13/21, 10:21 AM

## 2021-01-13 NOTE — Progress Notes (Signed)
Physical Therapy Treatment Patient Details Name: Cynthia Lewis MRN: [id number] DOB: 1986-07-06 Today's Date: 01/13/2021    History of Present Illness 35 y.o. female s/p L ankle ORIF of trimalleolar ankle fracture 2/2 fall while walking her dog. PMHx includes HTN, ADHD, and anxiety.    PT Comments    Pt alert, reported 3/10 pain in L ankle. Pt demonstrated bed mobility modI, and was able to ambulate with hop to gait pattern to bathroom, modI for pericare and transfer to sink to wash hands. Stair training performed with bilateral crutches, CGA throughout. One small LOB noted, but pt verbalized understanding (and provided with handout), and agreeable to 2nd person assist with stairs. Pt returned to bed at her request at end of session. Exercise packets administered and crutches for pt home use adjusted. The patient would benefit from further skilled PT intervention to continue to progress towards goals. Recommendation remains appropriate.     Follow Up Recommendations  Follow surgeon's recommendation for DC plan and follow-up therapies;Supervision - Intermittent     Equipment Recommendations  Rolling walker with 5" wheels;3in1 (PT)    Recommendations for Other Services       Precautions / Restrictions Precautions Precautions: Fall Restrictions Weight Bearing Restrictions: Yes LLE Weight Bearing: Non weight bearing    Mobility  Bed Mobility Overal bed mobility: Modified Independent Bed Mobility: Supine to Sit;Sit to Supine     Supine to sit: Modified independent (Device/Increase time) Sit to supine: Modified independent (Device/Increase time)   General bed mobility comments: pt modI for bed mobility    Transfers Overall transfer level: Needs assistance Equipment used: Crutches Transfers: Sit to/from [employer] Sit to Stand: Supervision Stand pivot transfers: Supervision       General transfer comment: cues for hand/foot  placement  Ambulation/Gait Ambulation/Gait assistance: Supervision Gait Distance (Feet): 17 Feet Assistive device: Crutches       General Gait Details: 1-2 standing rest breaks led by PT, to allow pt rest and then adjust crutches height   Stairs Stairs: Yes Stairs assistance: Min guard Stair Management: No rails;Forwards;With crutches Number of Stairs: 2 General stair comments: one small LOB, able to correct with minA. Pt verbalized understanding, provided with handout for instructions and educated on need for 2nd person assist for stairs for now   Wheelchair Mobility    Modified Rankin (Stroke Patients Only)       Balance Overall balance assessment: Needs assistance Sitting-balance support: Feet supported Sitting balance-Leahy Scale: Good     Standing balance support: Bilateral upper extremity supported Standing balance-Leahy Scale: Fair Standing balance comment: able to stand statically with single UE support. stand pivot with noAD, use of bed rails                            Cognition Arousal/Alertness: Awake/alert Behavior During Therapy: WFL for tasks assessed/performed Overall Cognitive Status: Within Functional Limits for tasks assessed                                        Exercises Other Exercises Other Exercises: Pt educated on crutch training, potential use of knee scooter if cleared by MD, and some stair training education reviewed    General Comments        Pertinent Vitals/Pain Pain Assessment: 0-10 Pain Score: 3  Pain Location: LLE Pain Descriptors / Indicators: Aching Pain Intervention(s):  Limited activity within patient's tolerance;Monitored during session;Premedicated before session;Repositioned    Home Living Family/patient expects to be discharged to:: Private residence Living Arrangements: Alone Available Help at Discharge: Family;Available 24 hours/day Type of Home: House Home Access: Stairs to  enter Entrance Stairs-Rails: None Home Layout: One level Home Equipment: None Additional Comments: Above home set up for mother's home where she will be staying initially    Prior Function Level of Independence: Independent      Comments: Pt independent, 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, no other falls   PT Goals (current goals can now be found in the care plan section) Acute Rehab PT Goals Patient Stated Goal: go home with parent and recover PT Goal Formulation: With patient Time For Goal Achievement: 01/27/21 Potential to Achieve Goals: Good Progress towards PT goals: Progressing toward goals    Frequency    BID      PT Plan Current plan remains appropriate    Co-evaluation              AM-PAC PT "6 Clicks" Mobility   Outcome Measure  Help needed turning from your back to your side while in a flat bed without using bedrails?: None Help needed moving from lying on your back to sitting on the side of a flat bed without using bedrails?: None Help needed moving to and from a bed to a chair (including a wheelchair)?: None Help needed standing up from a chair using your arms (e.g., wheelchair or bedside chair)?: None Help needed to walk in hospital room?: None Help needed climbing 3-5 steps with a railing? : A Little 6 Click Score: 23    End of Session Equipment Utilized During Treatment: Gait belt Activity Tolerance: Patient tolerated treatment well Patient left: in bed;with call bell/phone within reach Nurse Communication: Mobility status PT Visit Diagnosis: Other abnormalities of gait and mobility (R26.89);Muscle weakness (generalized) (M62.81);Difficulty in walking, not elsewhere classified (R26.2)     Time: 1315-1340 PT Time Calculation (min) (ACUTE ONLY): 25 min  Charges:  $Gait Training: 23-37 mins                     Olga Coaster PT, DPT 1:59 PM,01/13/21

## 2021-01-13 NOTE — Progress Notes (Signed)
  Subjective:  POD #1 s/p ORIF of left trimalleolar ankle fracture equivalent.   Patient reports left ankle pain as mild to moderate.  Patient is laying in bed with her left leg elevated.  Patient has not yet had a bowel movement.  She was able to get up today with PT and felt it was easier to get around with crutches than a walker.  Objective:   VITALS:   Vitals:   01/13/21 0558 01/13/21 0814 01/13/21 1226 01/13/21 1459  BP: 107/61 108/76 130/89 (!) 147/95  Pulse: 94 92 (!) 107 (!) 108  Resp: 17  18 17   Temp: 98.5 F (36.9 C) 98.5 F (36.9 C) 98.1 F (36.7 C) 98 F (36.7 C)  TempSrc: Oral Oral Oral Oral  SpO2: 98% 97% 100% 99%  Weight:      Height:        PHYSICAL EXAM: Left lower extremity: Splint and dressings remain intact and clean and dry. Her toes are well-perfused.  She has intact sensation light touch in all 5 toes with slight paresthesias in the hallux.  Patient can flex and extend her toes but the range of motion is limited due to the splint.   LABS  No results found for this or any previous visit (from the past 24 hour(s)).  DG Ankle Complete Left  Result Date: 01/12/2021 CLINICAL DATA:  Postop. EXAM: LEFT ANKLE COMPLETE - 3+ VIEW COMPARISON:  Preoperative radiograph yesterday. FINDINGS: Lateral plate and multi screw fixation of distal fibula. 3 screws traverse the distal tibia. Syndesmotic trapeze. Ankle mortise is congruent. Recent postsurgical change with soft tissue edema and skin staples in place. Overlying splint in place. IMPRESSION: ORIF ankle fracture dislocation without immediate postoperative complication. Electronically Signed   By: 01/14/2021 M.D.   On: 01/12/2021 18:28   DG MINI C-ARM IMAGE ONLY  Result Date: 01/12/2021 There is no interpretation for this exam.  This order is for images obtained during a surgical procedure.  Please See "Surgeries" Tab for more information regarding the procedure.    Assessment/Plan: 1 Day Post-Op   Active  Problems:   Closed left ankle fracture  Patient doing well postop.  Continue with physical therapy.  Continue strict elevation left lower extremity.  If the patient has a bowel movement she may be discharged home.  I instructed the patient to continue strict elevation left lower extremity at home.  She will remain nonweightbearing until she follows up in my office in 2 weeks.  I instructed the patient take enteric-coated aspirin 325 mg p.o. twice daily for DVT prophylaxis.  She was also instructed to take a stool softener and laxative at home to prevent constipation from oxycodone.    01/14/2021 , MD 01/13/2021, 4:18 PM

## 2021-01-14 MED ORDER — MAGNESIUM HYDROXIDE 400 MG/5ML PO SUSP
30.0000 mL | Freq: Every day | ORAL | Status: DC | PRN
  Administered 2021-01-14: 30 mL via ORAL
  Filled 2021-01-14: qty 30

## 2021-01-14 NOTE — Progress Notes (Signed)
OT Cancellation Note  Patient Details Name: Cynthia Lewis MRN: [id number] DOB: [birth year]   Cancelled Treatment:    Reason Eval/Treat Not Completed: Patient declined, no reason specified. Upon attempt, pt in bed, LLE elevated, complaining of 5/10 ankle pain. Pt declines OT 2/2 pain and being tired, verbalizes no additional needs at this time. Will re-attempt as appropriate.   Wynona Canes, MPH, MS, OTR/L ascom [phone] 01/14/21, 9:49 AM

## 2021-01-14 NOTE — Progress Notes (Signed)
Physical Therapy Treatment Patient Details Name: Cynthia Lewis MRN: [id number] DOB: [birth year] Today's Date: 01/14/2021    History of Present Illness 35 y.o. female s/p L ankle ORIF of trimalleolar ankle fracture 2/2 fall while walking her dog. PMHx includes HTN, ADHD, and anxiety.    PT Comments    Pt in bed, reported more pain with her LLE last night, main complaint this AM is heaviness and numbness of LLE. Good color and capillary refill noted of L toes. The patient was able to perform bed mobility modI, and sit <> stand with crutches and supervision, safe hand placement noted. She was able to ambulate with hop to gait pattern ~37ft total with supervision, excellent adherence to NWB precautions. Stair navigation of 1 step up/down performed, CGA. Improved steadiness noted, but more effortful for pt this AM. Pt verbally instructed in seated boosting stair navigation technique if needed at discharge, verbalized understanding. Pt returned to bed and LLE elevated with three pillows and pt encouraged to lay more supine. The patient stated she has been performing her HEP as instructed. The patient would benefit from further skilled PT intervention to continue to progress towards goals. Recommendation remains appropriate.     Follow Up Recommendations  Follow surgeon's recommendation for DC plan and follow-up therapies;Supervision - Intermittent     Equipment Recommendations  Rolling walker with 5" wheels;3in1 (PT)    Recommendations for Other Services       Precautions / Restrictions Precautions Precautions: Fall Restrictions Weight Bearing Restrictions: Yes LLE Weight Bearing: Non weight bearing    Mobility  Bed Mobility Overal bed mobility: Modified Independent                  Transfers Overall transfer level: Needs assistance Equipment used: Crutches Transfers: Sit to/from Stand Sit to Stand: Supervision            Ambulation/Gait Ambulation/Gait assistance:  Supervision Gait Distance (Feet): 60 Feet Assistive device: Crutches           Stairs Stairs: Yes Stairs assistance: Min guard Stair Management: No rails;Forwards;With crutches Number of Stairs: 1 General stair comments: improved balance noted, but more effortful for pt today   Wheelchair Mobility    Modified Rankin (Stroke Patients Only)       Balance Overall balance assessment: Needs assistance Sitting-balance support: Feet supported Sitting balance-Leahy Scale: Good     Standing balance support: Bilateral upper extremity supported Standing balance-Leahy Scale: Fair Standing balance comment: able to stand statically with single UE support. stand pivot with noAD, use of bed rails                            Cognition Arousal/Alertness: Awake/alert Behavior During Therapy: WFL for tasks assessed/performed Overall Cognitive Status: Within Functional Limits for tasks assessed                                        Exercises      General Comments        Pertinent Vitals/Pain Pain Assessment: 0-10 Pain Score: 6  Pain Location: LLE, but pt has more complaints of feeling as if her foot is numb, heavy leg Pain Descriptors / Indicators: Sore;Numbness Pain Intervention(s): Limited activity within patient's tolerance;Monitored during session;Repositioned    Home Living  Prior Function            PT Goals (current goals can now be found in the care plan section) Progress towards PT goals: Progressing toward goals    Frequency    BID      PT Plan Current plan remains appropriate    Co-evaluation              AM-PAC PT "6 Clicks" Mobility   Outcome Measure  Help needed turning from your back to your side while in a flat bed without using bedrails?: None Help needed moving from lying on your back to sitting on the side of a flat bed without using bedrails?: None Help needed moving to and  from a bed to a chair (including a wheelchair)?: None Help needed standing up from a chair using your arms (e.g., wheelchair or bedside chair)?: None Help needed to walk in hospital room?: None Help needed climbing 3-5 steps with a railing? : A Little 6 Click Score: 23    End of Session Equipment Utilized During Treatment: Gait belt Activity Tolerance: Patient tolerated treatment well Patient left: in bed;with call bell/phone within reach Nurse Communication: Mobility status PT Visit Diagnosis: Other abnormalities of gait and mobility (R26.89);Muscle weakness (generalized) (M62.81);Difficulty in walking, not elsewhere classified (R26.2)     Time: 3790-2409 PT Time Calculation (min) (ACUTE ONLY): 15 min  Charges:  $Gait Training: 8-22 mins                    Olga Coaster PT, DPT 9:20 AM,01/14/21

## 2021-01-14 NOTE — Progress Notes (Signed)
  Subjective:  POD #2 s/p ORIF of left ankle fracture.   Patient reports left ankle pain as mild.  Patient continuing strict elevation left lower extremity.  Patient states she was able to have a bowel movement.  She feels ready to go home today.  Objective:   VITALS:   Vitals:   01/14/21 0124 01/14/21 0511 01/14/21 0735 01/14/21 1143  BP: 114/77 126/82 130/78 135/75  Pulse: 88 100 97 (!) 110  Resp:  20 15 16   Temp:  99.2 F (37.3 C) 99.5 F (37.5 C) 98.7 F (37.1 C)  TempSrc:  Oral Oral Oral  SpO2:  100% 96% 97%  Weight:      Height:        PHYSICAL EXAM: Left lower extremity Neurovascular intact Sensation intact distally Toes well perfused Patient can flex and extend her toes Incision: dressing C/D/I No cellulitis present Compartment soft  LABS  No results found for this or any previous visit (from the past 24 hour(s)).  DG Ankle Complete Left  Result Date: 01/12/2021 CLINICAL DATA:  Postop. EXAM: LEFT ANKLE COMPLETE - 3+ VIEW COMPARISON:  Preoperative radiograph yesterday. FINDINGS: Lateral plate and multi screw fixation of distal fibula. 3 screws traverse the distal tibia. Syndesmotic trapeze. Ankle mortise is congruent. Recent postsurgical change with soft tissue edema and skin staples in place. Overlying splint in place. IMPRESSION: ORIF ankle fracture dislocation without immediate postoperative complication. Electronically Signed   By: 01/14/2021 M.D.   On: 01/12/2021 18:28    Assessment/Plan: 2 Days Post-Op   Active Problems:   Closed left ankle fracture  Patient doing well postop.  Patient will continue strict elevation at home and remain nonweightbearing on the left lower extremity until 6 to 8 weeks postop.  Patient will follow-up in my office and 10 to 14 days.  Continue enteric-coated aspirin 325 mg p.o. twice daily for DVT prophylaxis.    01/14/2021 , MD 01/14/2021, 1:01 PM

## 2021-01-14 NOTE — Plan of Care (Signed)

## 2021-03-03 ENCOUNTER — Telehealth: Payer: Self-pay | Admitting: Internal Medicine

## 2021-03-03 NOTE — Telephone Encounter (Signed)
Called and spoke to [name] to inform her that we have the immunization summary from our office through Epic and it is ready for pick up. Pt declined and states that she needs the immunization record from [place] Informed pt that the [facility] website is down currently and we can not check her records at this time. Cynthia Lewis verbalized understanding and states that she will call back tomorrow or Wednesday to check if the system has come up and we can print off her records.

## 2021-03-03 NOTE — Telephone Encounter (Signed)
PT called to request the immunization record. They would like a copy and would like to be called when it is ready to be picked up. Their needing it for [facility].

## 2021-03-04 NOTE — Telephone Encounter (Signed)
Spoke with pt to let her know that the vaccine record has been printed and placed up front for pick up.

## 2021-03-07 ENCOUNTER — Ambulatory Visit: Payer: Managed Care, Other (non HMO)

## 2021-04-02 ENCOUNTER — Other Ambulatory Visit: Payer: Self-pay | Admitting: Internal Medicine

## 2021-04-14 LAB — BASIC METABOLIC PANEL: Glucose: 101

## 2021-04-14 LAB — LIPID PANEL
Cholesterol: 205 — AB (ref 0–200)
LDL Cholesterol: 122
Triglycerides: 212 — AB (ref 40–160)

## 2021-04-16 ENCOUNTER — Encounter: Payer: Self-pay | Admitting: Internal Medicine

## 2021-04-16 ENCOUNTER — Ambulatory Visit (INDEPENDENT_AMBULATORY_CARE_PROVIDER_SITE_OTHER): Payer: Managed Care, Other (non HMO) | Admitting: Internal Medicine

## 2021-04-16 ENCOUNTER — Other Ambulatory Visit: Payer: Self-pay

## 2021-04-16 DIAGNOSIS — F411 Generalized anxiety disorder: Secondary | ICD-10-CM

## 2021-04-16 DIAGNOSIS — F909 Attention-deficit hyperactivity disorder, unspecified type: Secondary | ICD-10-CM | POA: Diagnosis not present

## 2021-04-16 DIAGNOSIS — E6609 Other obesity due to excess calories: Secondary | ICD-10-CM | POA: Diagnosis not present

## 2021-04-16 DIAGNOSIS — Z6832 Body mass index (BMI) 32.0-32.9, adult: Secondary | ICD-10-CM

## 2021-04-16 DIAGNOSIS — S82892S Other fracture of left lower leg, sequela: Secondary | ICD-10-CM

## 2021-04-16 DIAGNOSIS — R87612 Low grade squamous intraepithelial lesion on cytologic smear of cervix (LGSIL): Secondary | ICD-10-CM

## 2021-04-16 MED ORDER — LISDEXAMFETAMINE DIMESYLATE 40 MG PO CAPS: 40.0000 mg | ORAL_CAPSULE | Freq: Every day | ORAL | 0 refills | Status: DC

## 2021-04-16 NOTE — Assessment & Plan Note (Signed)
PAPs are done annually  [place] Feb 2022   [place] March 2022.

## 2021-04-16 NOTE — Patient Instructions (Addendum)
800 mg ibuprofen plus 1000 mg tylenol may provide enough pain relief to take the place of tramadol at night.   Apply the principles of the [employer] diet to your current lifestyle and you should be able to lose 4 lb/month   If appetite suppression is needed we can discuss using Ozempic

## 2021-04-16 NOTE — Progress Notes (Signed)
Subjective:  Patient ID: Cynthia Lewis, female    DOB: 04-Jan-1986  Age: 34 y.o. MRN: [id number]  CC: Diagnoses of LGSIL on Pap smear of cervix, Adult ADHD, GAD (generalized anxiety disorder), Class 1 obesity due to excess calories without serious comorbidity with body mass index (BMI) of 32.0 to 32.9 in adult, and Closed fracture of left ankle, sequela were pertinent to this visit.  HPI Cynthia Lewis presents for 6 month follow up on obesity,GAD,  and ADD  This visit occurred during the SARS-CoV-2 public health emergency.  Safety protocols were in place, including screening questions prior to the visit, additional usage of staff PPE, and extensive cleaning of exam room while observing appropriate contact time as indicated for disinfecting solutions.    Recent labs reviewed :  random glucose of 101 discussed due to patient's concern.   Since her last visit she sustained  an ankle fracture, left side in May , occurred while walking dog  Was on crutches until  3 weeks ago, using tramadol prescribed for nighttime pain prescribed by orthopedist Shela Nevin, and getting PT to restore full ROM.    Reviewed prior methods of weight loss. Optavia diet too expensive for patient , discussed how to duplicate the nutritional goals using locally available food options.   Anxiety /ADD :  her anxiety has improved with treatment of ADD.  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 Anxiety  primarily triggered by travel , even local,  for fear of "being late."         GYN history:  has been  tested for HIV and Hep C.  Has had  2 colposcopies for abnormal PAPs.   Outpatient Medications Prior to Visit  Medication Sig Dispense Refill   ALPRAZolam (XANAX) 0.25 MG tablet TAKE 1 TABLET(0.25 MG) BY MOUTH ONCE DAILY  AS NEEDED FOR ANXIETY (Patient taking differently: Take 0.25 mg by mouth daily as needed for anxiety.) 30 tablet 5   citalopram (CELEXA) 20 MG tablet TAKE 1  TABLET(20 MG) BY MOUTH DAILY 90 tablet 3   levonorgestrel (MIRENA) 20 MCG/24HR IUD 1 each by Intrauterine route once.     ondansetron (ZOFRAN-ODT) 4 MG disintegrating tablet DISSOLVE 1 TABLET BY MOUTH EVERY 8 HOURS AS NEEDED FOR NAUSEA OR VOMITING (Patient taking differently: Take 4 mg by mouth every 8 (eight) hours as needed for nausea or vomiting.) 20 tablet 0   traMADol (ULTRAM) 50 MG tablet Take 50 mg by mouth every 6 (six) hours.     lisdexamfetamine (VYVANSE) 40 MG capsule Take 1 capsule (40 mg total) by mouth daily. 30 capsule 0   aspirin EC 325 MG tablet Take 1 tablet (325 mg total) by mouth 2 (two) times daily. 90 tablet 0   bisacodyl (DULCOLAX) 5 MG EC tablet Take 1 tablet (5 mg total) by mouth daily as needed for moderate constipation. 30 tablet 0   docusate sodium (COLACE) 100 MG capsule Take 1 capsule (100 mg total) by mouth 2 (two) times daily. 10 capsule 0   oxyCODONE (OXY IR/ROXICODONE) 5 MG immediate release tablet Take 1 tablet (5 mg total) by mouth every 4 (four) hours as needed for moderate pain (pain score 4-6). 30 tablet 0   No facility-administered medications prior to visit.    Review of Systems;  Patient denies headache, fevers, malaise, unintentional weight loss, skin rash, eye pain, sinus congestion and sinus pain, sore throat, dysphagia,  hemoptysis ,  cough, dyspnea, wheezing, chest pain, palpitations, orthopnea, edema, abdominal pain, nausea, melena, diarrhea, constipation, flank pain, dysuria, hematuria, urinary  Frequency, nocturia, numbness, tingling, seizures,  Focal weakness, Loss of consciousness,  Tremor, insomnia, depression, anxiety, and suicidal ideation.      Objective:  BP 118/84   Pulse 91   Ht 5\' 5"  (1.651 m)   Wt 265 lb 9.6 oz (120.5 kg)   SpO2 94%   BMI 44.20 kg/m   BP Readings from Last 3 Encounters:  04/16/21 118/84  01/14/21 135/75  12/04/20 114/64    Wt Readings from Last 3 Encounters:  04/16/21 265 lb 9.6 oz (120.5 kg)  01/11/21  268 lb (121.6 kg)  12/04/20 270 lb 9.6 oz (122.7 kg)    General appearance: alert, cooperative and appears stated age Ears: normal TM's and external ear canals both ears Throat: lips, mucosa, and tongue normal; teeth and gums normal Neck: no adenopathy, no carotid bruit, supple, symmetrical, trachea midline and thyroid not enlarged, symmetric, no tenderness/mass/nodules Back: symmetric, no curvature. ROM normal. No CVA tenderness. Lungs: clear to auscultation bilaterally Heart: regular rate and rhythm, S1, S2 normal, no murmur, click, rub or gallop Abdomen: soft, non-tender; bowel sounds normal; no masses,  no organomegaly Pulses: 2+ and symmetric Skin: Skin color, texture, turgor normal. No rashes or lesions Lymph nodes: Cervical, supraclavicular, and axillary nodes normal.  Lab Results  Component Value Date   HGBA1C 5.2 09/04/2020   HGBA1C 5.2 03/18/2018   HGBA1C 5.2 03/26/2017    Lab Results  Component Value Date   CREATININE 0.93 01/11/2021   CREATININE 1.00 09/04/2020   CREATININE 1.05 (H) 12/02/2018    Lab Results  Component Value Date   WBC 16.5 (H) 01/11/2021   HGB 14.6 01/11/2021   HCT 41.9 01/11/2021   PLT 301 01/11/2021   GLUCOSE 132 (H) 01/11/2021   CHOL 205 (A) 04/14/2021   TRIG 212 (A) 04/14/2021   HDL 46 09/04/2020   LDLCALC 122 04/14/2021   ALT 16 01/11/2021   AST 17 01/11/2021   NA 137 01/11/2021   K 4.1 01/11/2021   CL 104 01/11/2021   CREATININE 0.93 01/11/2021   BUN 13 01/11/2021   CO2 24 01/11/2021   TSH 2.280 09/04/2020   INR 1.0 01/11/2021   HGBA1C 5.2 09/04/2020    DG Ankle Complete Left  Result Date: 01/12/2021 CLINICAL DATA:  Postop. EXAM: LEFT ANKLE COMPLETE - 3+ VIEW COMPARISON:  Preoperative radiograph yesterday. FINDINGS: Lateral plate and multi screw fixation of distal fibula. 3 screws traverse the distal tibia. Syndesmotic trapeze. Ankle mortise is congruent. Recent postsurgical change with soft tissue edema and skin staples in  place. Overlying splint in place. IMPRESSION: ORIF ankle fracture dislocation without immediate postoperative complication. Electronically Signed   By: 01/14/2021 M.D.   On: 01/12/2021 18:28   DG Ankle Complete Left  Result Date: 01/11/2021 CLINICAL DATA:  Fall, ankle pain while walking dog this morning. EXAM: LEFT ANKLE COMPLETE - 3+ VIEW COMPARISON:  Foot evaluation from Jan 02, 2020. FINDINGS: Fracture dislocation of the LEFT ankle with syndesmotic widening, fracture of the medial malleolus and posterior tibia with posterior translation of the talus relative to the tibia. Soft tissue swelling about the ankle. No visible lateral malleolar fracture. IMPRESSION: Fracture dislocation of the LEFT ankle with posterior translation of the talus relative to the tibia, medial malleolar fracture and "posterior malleolus fracture". May be functionally similar to a trimalleolar fracture given syndesmotic widening. CT may be helpful for further evaluation  as warranted. Electronically Signed   By: Donzetta KohutGeoffrey  Wile M.D.   On: 01/11/2021 12:45   CT Ankle Left Wo Contrast  Result Date: 01/11/2021 CLINICAL DATA:  Left ankle fracture-dislocation status post reduction EXAM: CT OF THE LEFT ANKLE WITHOUT CONTRAST TECHNIQUE: Multidetector CT imaging of the left ankle was performed according to the standard protocol. Multiplanar CT image reconstructions were also generated. COMPARISON:  Same day radiographs FINDINGS: Bones/Joint/Cartilage Acute comminuted bimalleolar left ankle fracture-dislocation status post reduction. Improved alignment of the ankle mortise. Comminuted medial malleolar fracture including transversely oriented component through the base of the medial malleolus is slightly displaced posteromedially. Comminuted vertically oriented component through the posterior malleolus extending through the medial malleolus and into the distal tibiofibular joint (series 4, image 96). There is mild posterosuperior  displacement of the posterior malleolus. Mild articular surface depression with multiple small comminuted fracture fragments along the fracture site as well as a few tiny intra-articular fracture fragments within the ankle joint displaced anteriorly. There is a 4 mm bony avulsion fragment emanating from the anterolateral rim of the distal tibia which is laterally displaced by approximately 9 mm, likely reflecting anterior tibiofibular ligament avulsion injury (series 4, image 98). Distal fibula is intact without fracture. Distal tibiofibular joint alignment is maintained. Talus intact without evidence of fracture. Calcaneus intact. Bones of the midfoot are intact. TMT joint alignment is maintained. No significant arthropathy. Ligaments Suboptimally assessed by CT. Muscles and Tendons No acute musculotendinous injury by CT. No evidence to suggest tendinous entrapment. Soft tissues Diffuse soft tissue swelling. Ill-defined hematoma along the anterior aspect of the ankle. IMPRESSION: 1. Acute comminuted bimalleolar left ankle fracture-dislocation status post reduction, as described above. Improved alignment of the ankle mortise. 2. Diffuse soft tissue swelling and ill-defined hematoma along the anterior aspect of the ankle. Electronically Signed   By: Duanne GuessNicholas  Plundo D.O.   On: 01/11/2021 15:45   DG MINI C-ARM IMAGE ONLY  Result Date: 01/12/2021 There is no interpretation for this exam.  This order is for images obtained during a surgical procedure.  Please See "Surgeries" Tab for more information regarding the procedure.    Assessment & Plan:   Problem List Items Addressed This Visit       Unprioritized   Adult ADHD    Diagnosed with formal testing June 2021 with decreased executive function.  Tolerating Vyvanse 40 mg daily .  She is functioning well at work and completing her assignments for her master's degree.  No changes today . Refill history confirmed via [place] Controlled Substance databas, accessed  by me today..      Relevant Medications   lisdexamfetamine (VYVANSE) 40 MG capsule   lisdexamfetamine (VYVANSE) 40 MG capsule (Start on 05/16/2021)   Closed left ankle fracture    Requiring reduction and fixation by Murlean CallerKevin Krasinksi. Lateral plate and multi screw fixation of distal fibula. 3 screws traverse the distal tibia.       GAD (generalized anxiety disorder)    She feels that her symptom have improved since she began effective  treatment of her ADD and continued use of citalopram .   She using alprazolam once a week prn insomnia. The risks and benefits of benzodiazepine use were reviewed with patient today including excessive sedation leading to respiratory depression,  impaired thinking/driving, and addiction.  Patient was advised to avoid concurrent use with alcohol, to use medication only as needed and not to share with others  .        LGSIL on Pap smear  of cervix    PAPs are done annually  [place] Feb 2022   colpsocoy March 2022.        Obesity    She has lost 17 lb since January.  Exercise has been stopped  Due to comminuted bimalleolar left ankle fracture-dislocation status post reduction, in May .  Reviewed options of management including use of Ozempic for appetite suppression .       Relevant Medications   lisdexamfetamine (VYVANSE) 40 MG capsule   lisdexamfetamine (VYVANSE) 40 MG capsule (Start on 05/16/2021)    I spent 30 minutes dedicated to the care of this patient on the date of this encounter to include pre-visit review of her medical history,  recent ankle fracture and surgery,  Face-to-face time with the patient , review of recent labs and counselling on obesity management     Meds ordered this encounter  Medications   lisdexamfetamine (VYVANSE) 40 MG capsule    Sig: Take 1 capsule (40 mg total) by mouth daily.    Dispense:  30 capsule    Refill:  0   lisdexamfetamine (VYVANSE) 40 MG capsule    Sig: Take 1 capsule (40 mg total) by mouth daily.     Dispense:  30 capsule    Refill:  0    Medications Discontinued During This Encounter  Medication Reason   aspirin EC 325 MG tablet    bisacodyl (DULCOLAX) 5 MG EC tablet    docusate sodium (COLACE) 100 MG capsule    oxyCODONE (OXY IR/ROXICODONE) 5 MG immediate release tablet    lisdexamfetamine (VYVANSE) 40 MG capsule Reorder    Follow-up: Return in about 6 months (around 10/17/2021).   Sherlene Shams, MD

## 2021-04-17 ENCOUNTER — Encounter: Payer: Self-pay | Admitting: Internal Medicine

## 2021-04-17 NOTE — Assessment & Plan Note (Signed)
Diagnosed with formal testing June 2021 with decreased executive function.  Tolerating Vyvanse 40 mg daily .  She is functioning well at work and completing her assignments for her master's degree.  No changes today . Refill history confirmed via [place] Controlled Substance databas, accessed by me today.. 

## 2021-04-17 NOTE — Assessment & Plan Note (Addendum)
Requiring reduction and fixation by Murlean Caller. Lateral plate and multi screw fixation of distal fibula. 3 screws traverse the distal tibia.

## 2021-04-17 NOTE — Assessment & Plan Note (Signed)
She feels that her symptom have improved since she began effective  treatment of her ADD and continued use of citalopram .   She using alprazolam once a week prn insomnia. The risks and benefits of benzodiazepine use were reviewed with patient today including excessive sedation leading to respiratory depression,  impaired thinking/driving, and addiction.  Patient was advised to avoid concurrent use with alcohol, to use medication only as needed and not to share with others  .   

## 2021-04-17 NOTE — Assessment & Plan Note (Addendum)
She has lost 17 lb since January.  Exercise has been stopped  Due to comminuted bimalleolar left ankle fracture-dislocation status post reduction, in May .  Reviewed options of management including use of Ozempic for appetite suppression .

## 2021-04-18 ENCOUNTER — Encounter: Payer: Self-pay | Admitting: Podiatry

## 2021-04-18 ENCOUNTER — Other Ambulatory Visit: Payer: Self-pay

## 2021-04-18 ENCOUNTER — Ambulatory Visit (INDEPENDENT_AMBULATORY_CARE_PROVIDER_SITE_OTHER): Payer: Managed Care, Other (non HMO) | Admitting: Podiatry

## 2021-04-18 DIAGNOSIS — M722 Plantar fascial fibromatosis: Secondary | ICD-10-CM | POA: Diagnosis not present

## 2021-04-18 DIAGNOSIS — M24571 Contracture, right ankle: Secondary | ICD-10-CM | POA: Diagnosis not present

## 2021-04-18 DIAGNOSIS — M24572 Contracture, left ankle: Secondary | ICD-10-CM | POA: Diagnosis not present

## 2021-04-18 NOTE — Progress Notes (Signed)
   Subjective: 35 year old female presenting today for follow up evaluation of bilateral plantar fasciitis.  Patient states that since her plantar fasciitis visit May 2021 she did sustain ankle fracture left.  She is doing well.  She continues to do physical therapy.  She is currently wearing the orthotics that she had previously made here in the office.  No new complaints at this time   Past Medical History:  Diagnosis Date   ADHD    Anxiety    Hx of dysplastic nevus 2012   Left medial buttock    Hypertension      Objective: Physical Exam General: The patient is alert and oriented x3 in no acute distress.  Dermatology: Skin is warm, dry and supple bilateral lower extremities. Negative for open lesions or macerations bilateral.   Vascular: Dorsalis Pedis and Posterior Tibial pulses palpable bilateral.  Capillary fill time is immediate to all digits.  Neurological: Epicritic and protective threshold intact bilateral.   Musculoskeletal: Tenderness to palpation at the medial calcaneal tubercale and through the insertion of the plantar fascia of the bilateral feet. All other joints range of motion within normal limits bilateral. Strength 5/5 in all groups bilateral.    Assessment: 1. plantar fasciitis bilateral feet   Plan of Care:  1. Patient evaluated.  2.  Today the patient was recast for custom molded orthotics 3.  Continue ankle rehab left 4.  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 Also works at [employer] at [employer]. 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   Felecia Shelling, DPM [facility]  Dr. Felecia Shelling, DPM    [address] [place], [place] [address]                Office [phone]  Fax [phone]

## 2021-05-08 ENCOUNTER — Other Ambulatory Visit: Payer: Self-pay | Admitting: Internal Medicine

## 2021-05-08 DIAGNOSIS — F411 Generalized anxiety disorder: Secondary | ICD-10-CM

## 2021-05-08 NOTE — Telephone Encounter (Signed)
RX Refill:xanax Last Seen:04-16-21 Last ordered:09-04-20

## 2021-05-28 ENCOUNTER — Telehealth: Payer: Self-pay | Admitting: Podiatry

## 2021-05-28 NOTE — Telephone Encounter (Signed)
Pt left message asking if her orthotics were in she was measured end of august beginning of sept.  Upon looking they did just come in to the [place] office and I left message for pt that I would send them with one of the doctors to [place] office and they should be there tomorrow for pick up.Marland Kitchen

## 2021-06-05 ENCOUNTER — Ambulatory Visit: Payer: Managed Care, Other (non HMO) | Admitting: Internal Medicine

## 2021-08-05 ENCOUNTER — Other Ambulatory Visit: Payer: Self-pay | Admitting: Internal Medicine

## 2021-08-05 DIAGNOSIS — F909 Attention-deficit hyperactivity disorder, unspecified type: Secondary | ICD-10-CM

## 2021-08-05 NOTE — Telephone Encounter (Signed)
Patient is requesting a refill on her lisdexamfetamine (VYVANSE) 40 MG capsule

## 2021-08-05 NOTE — Telephone Encounter (Signed)
Refilled: 05/16/2021 Last OV: 04/16/2021 Next OV: not scheduled

## 2021-08-06 MED ORDER — LISDEXAMFETAMINE DIMESYLATE 40 MG PO CAPS: 40.0000 mg | ORAL_CAPSULE | Freq: Every day | ORAL | 0 refills | Status: DC

## 2021-09-29 ENCOUNTER — Ambulatory Visit
Admission: EM | Admit: 2021-09-29 | Discharge: 2021-09-29 | Disposition: A | Discharge status: Home / Self Care | Payer: Managed Care, Other (non HMO) | Attending: Emergency Medicine | Admitting: Emergency Medicine

## 2021-09-29 ENCOUNTER — Encounter: Payer: Self-pay | Admitting: Emergency Medicine

## 2021-09-29 DIAGNOSIS — J0100 Acute maxillary sinusitis, unspecified: Secondary | ICD-10-CM

## 2021-09-29 MED ORDER — AZITHROMYCIN 250 MG PO TABS: 250.0000 mg | ORAL_TABLET | Freq: Every day | ORAL | 0 refills | Status: DC

## 2021-09-29 NOTE — ED Triage Notes (Signed)
Pt presents with cough, chest congestion and ST x 1 week.

## 2021-09-29 NOTE — ED Provider Notes (Signed)
Cynthia Lewis    CSN: [id number] Arrival date & time: 09/29/21  1030      History   Chief Complaint Chief Complaint  Patient presents with   Cough   Sore Throat    HPI Cynthia Lewis is a 36 y.o. female.  Patient presents with 1 week history of congestion, sinus pressure, sore throat, cough which is occasionally productive.  She denies fever, rash, shortness of breath, vomiting, diarrhea, or other symptoms.  Several OTC sinus medications attempted without relief.  Her medical history includes hypertension, migraine headaches, anxiety, ADHD.  The history is provided by medical records and the patient.   Past Medical History:  Diagnosis Date   ADHD    Anxiety    Hx of dysplastic nevus 2012   Left medial buttock    Hypertension     Patient Active Problem List   Diagnosis Date Noted   LGSIL on Pap smear of cervix 04/16/2021   Closed left ankle fracture 01/11/2021   Vertigo 09/11/2020   Adult ADHD 03/01/2020   Migraine headache 06/10/2018   Bilateral chronic knee pain 01/15/2018   GAD (generalized anxiety disorder) 11/05/2012   Routine general medical examination at a health care facility 11/05/2012   Obesity 08/05/2011   Screening for cervical cancer 08/05/2011    Past Surgical History:  Procedure Laterality Date   ORIF ANKLE FRACTURE Left 01/12/2021   Procedure: OPEN REDUCTION INTERNAL FIXATION (ORIF) ANKLE FRACTURE;  Surgeon: Juanell Fairly, MD;  Location: [facility] ORS;  Service: Orthopedics;  Laterality: Left;    OB History   No obstetric history on file.      Home Medications    Prior to Admission medications   Medication Sig Start Date End Date Taking? Authorizing Provider  ALPRAZolam (XANAX) 0.25 MG tablet Take 1 tablet (0.25 mg total) by mouth daily as needed for anxiety. 05/08/21  Yes Sherlene Shams, MD  azithromycin (ZITHROMAX) 250 MG tablet Take 1 tablet (250 mg total) by mouth daily. Take first 2 tablets together, then 1 every day until  finished. 09/29/21  Yes Mickie Bail, NP  citalopram (CELEXA) 20 MG tablet TAKE 1 TABLET(20 MG) BY MOUTH DAILY 04/02/21  Yes Sherlene Shams, MD  levonorgestrel (MIRENA) 20 MCG/24HR IUD 1 each by Intrauterine route once. 11/11/12  Yes [provider]  lisdexamfetamine (VYVANSE) 40 MG capsule Take 1 capsule (40 mg total) by mouth daily. 05/16/21  Yes Sherlene Shams, MD  lisdexamfetamine (VYVANSE) 40 MG capsule Take 1 capsule (40 mg total) by mouth daily. 08/06/21  Yes Worthy Rancher B, FNP  ondansetron (ZOFRAN-ODT) 4 MG disintegrating tablet DISSOLVE 1 TABLET BY MOUTH EVERY 8 HOURS AS NEEDED FOR NAUSEA OR VOMITING Patient not taking: Reported on 04/18/2021 10/22/20   Sherlene Shams, MD  traMADol (ULTRAM) 50 MG tablet Take 50 mg by mouth every 6 (six) hours. Patient not taking: Reported on 04/18/2021 04/14/21   [provider]    Family History Family History  Problem Relation Age of Onset   Ovarian cancer Mother    Anxiety disorder Father    Diabetes Father    Hypertension Father    Hyperlipidemia Father    Cancer Paternal Grandmother 90       breast ca   Diabetes Paternal Grandmother    Anxiety disorder Maternal Grandmother     Social History Social History   Tobacco Use   Smoking status: Never   Smokeless tobacco: Never  Vaping Use   Vaping Use: Never  used  Substance Use Topics   Alcohol use: Yes    Comment: OCCASIONALLY   Drug use: No     Allergies   Amoxicillin and Penicillins   Review of Systems Review of Systems  Constitutional:  Negative for chills and fever.  HENT:  Positive for congestion, postnasal drip, rhinorrhea, sinus pressure and sore throat. Negative for ear pain.   Respiratory:  Positive for cough. Negative for shortness of breath.   Cardiovascular:  Negative for chest pain and palpitations.  Gastrointestinal:  Negative for diarrhea and vomiting.  Skin:  Negative for color change and rash.  All other systems reviewed and are  negative.   Physical Exam Triage Vital Signs ED Triage Vitals [09/29/21 1035]  Enc Vitals Group     BP (!) 150/107     Pulse Rate 99     Resp 18     Temp 98.1 F (36.7 C)     Temp src      SpO2 96 %     Weight      Height      Head Circumference      Peak Flow      Pain Score      Pain Loc      Pain Edu?      Excl. in GC?    No data found.  Updated Vital Signs BP (!) 137/96 (BP Location: Right Arm)    Pulse 99    Temp 98.1 F (36.7 C)    Resp 18    SpO2 96%   Visual Acuity Right Eye Distance:   Left Eye Distance:   Bilateral Distance:    Right Eye Near:   Left Eye Near:    Bilateral Near:     Physical Exam Vitals and nursing note reviewed.  Constitutional:      General: She is not in acute distress.    Appearance: She is well-developed. She is obese. She is not ill-appearing.  HENT:     Right Ear: Tympanic membrane normal.     Left Ear: Tympanic membrane normal.     Nose: Congestion and rhinorrhea present.     Mouth/Throat:     Mouth: Mucous membranes are moist.     Pharynx: Oropharynx is clear.  Cardiovascular:     Rate and Rhythm: Normal rate and regular rhythm.     Heart sounds: Normal heart sounds.  Pulmonary:     Effort: Pulmonary effort is normal. No respiratory distress.     Breath sounds: Normal breath sounds.  Musculoskeletal:     Cervical back: Neck supple.  Skin:    General: Skin is warm and dry.  Neurological:     Mental Status: She is alert.  Psychiatric:        Mood and Affect: Mood normal.        Behavior: Behavior normal.     UC Treatments / Results  Labs (all labs ordered are listed, but only abnormal results are displayed) Labs Reviewed - No data to display  EKG   Radiology No results found.  Procedures Procedures (including critical care time)  Medications Ordered in UC Medications - No data to display  Initial Impression / Assessment and Plan / UC Course  I have reviewed the triage vital signs and the nursing  notes.  Pertinent labs & imaging results that were available during my care of the patient were reviewed by me and considered in my medical decision making (see chart for details).    Acute  sinusitis.  Patient has been symptomatic for 1 week and has not improved with several OTC medications.  Treating today with Zithromax.  Discussed ibuprofen and plain Mucinex.  Instructed patient to follow up with her PCP if her symptoms are not improving.  She agrees to plan of care.    Final Clinical Impressions(s) / UC Diagnoses   Final diagnoses:  Acute non-recurrent maxillary sinusitis     Discharge Instructions      Take the Zithromax as directed.  Follow up with your primary care provider if your symptoms are not improving.         ED Prescriptions     Medication Sig Dispense Auth. Provider   azithromycin (ZITHROMAX) 250 MG tablet Take 1 tablet (250 mg total) by mouth daily. Take first 2 tablets together, then 1 every day until finished. 6 tablet Mickie Bail, NP      PDMP not reviewed this encounter.   Mickie Bail, NP 09/29/21 1126

## 2021-09-29 NOTE — Discharge Instructions (Addendum)
Take the Zithromax as directed.  Follow up with your primary care provider if your symptoms are not improving.    

## 2021-10-10 ENCOUNTER — Telehealth: Payer: Self-pay | Admitting: Internal Medicine

## 2021-10-10 ENCOUNTER — Telehealth: Payer: Self-pay

## 2021-10-10 DIAGNOSIS — F909 Attention-deficit hyperactivity disorder, unspecified type: Secondary | ICD-10-CM

## 2021-10-10 MED ORDER — LISDEXAMFETAMINE DIMESYLATE 40 MG PO CAPS: 40.0000 mg | ORAL_CAPSULE | Freq: Every day | ORAL | 0 refills | Status: DC

## 2021-10-10 NOTE — Telephone Encounter (Signed)
Sent pt mychart msg

## 2021-10-10 NOTE — Telephone Encounter (Signed)
Patient is requesting a refill on her lisdexamfetamine (VYVANSE) 40 MG capsule. Pharmacy is [facility], [address] , [place]

## 2021-10-10 NOTE — Addendum Note (Signed)
Addended by: Sherlene Shams on: 10/10/2021 12:54 PM   Modules accepted: Orders

## 2021-10-10 NOTE — Telephone Encounter (Signed)
LAST vyvanse REFILL WITHOUT IN OFFICE VISIT , PLEASE SCHEDULE  6 month follow up

## 2021-10-24 ENCOUNTER — Other Ambulatory Visit: Payer: Self-pay

## 2021-10-24 ENCOUNTER — Ambulatory Visit: Payer: Managed Care, Other (non HMO) | Admitting: Internal Medicine

## 2021-10-24 ENCOUNTER — Encounter: Payer: Self-pay | Admitting: Internal Medicine

## 2021-10-24 VITALS — BP 128/86 | HR 99 | Temp 98.6°F | Ht 65.0 in | Wt 261.6 lb

## 2021-10-24 DIAGNOSIS — R87612 Low grade squamous intraepithelial lesion on cytologic smear of cervix (LGSIL): Secondary | ICD-10-CM | POA: Diagnosis not present

## 2021-10-24 DIAGNOSIS — F411 Generalized anxiety disorder: Secondary | ICD-10-CM

## 2021-10-24 DIAGNOSIS — I10 Essential (primary) hypertension: Secondary | ICD-10-CM | POA: Insufficient documentation

## 2021-10-24 DIAGNOSIS — F909 Attention-deficit hyperactivity disorder, unspecified type: Secondary | ICD-10-CM

## 2021-10-24 DIAGNOSIS — R030 Elevated blood-pressure reading, without diagnosis of hypertension: Secondary | ICD-10-CM | POA: Diagnosis not present

## 2021-10-24 DIAGNOSIS — E6609 Other obesity due to excess calories: Secondary | ICD-10-CM

## 2021-10-24 DIAGNOSIS — R7301 Impaired fasting glucose: Secondary | ICD-10-CM | POA: Diagnosis not present

## 2021-10-24 DIAGNOSIS — Z6832 Body mass index (BMI) 32.0-32.9, adult: Secondary | ICD-10-CM

## 2021-10-24 MED ORDER — LISDEXAMFETAMINE DIMESYLATE 40 MG PO CAPS: 40.0000 mg | ORAL_CAPSULE | Freq: Every day | ORAL | 0 refills | Status: DC

## 2021-10-24 MED ORDER — CITALOPRAM HYDROBROMIDE 20 MG PO TABS: 20.0000 mg | ORAL_TABLET | Freq: Every day | ORAL | 1 refills | Status: DC

## 2021-10-24 NOTE — Patient Instructions (Addendum)
The new "normal"  for  blood pressure is now 120/80, and you have been above that on the last 2 office visits  .  Please check your blood pressure a few times at home and send me the readings so I can determine if you need to start a medication to lower your blood pressure . ? ?Resume citalopram for your depression/anxiety ? ?Get new labs including urine ? ?Fasting from food BUT DRINK PLENTY OF WATER THE MORNING OF YOUR TESTIN  ? ? ?  ?

## 2021-10-24 NOTE — Progress Notes (Signed)
? ?Subjective:  ?Patient ID: Cynthia Lewis, female    DOB: 1986/05/11  Age: 36 y.o. MRN: [id number] ? ?CC: The primary encounter diagnosis was Impaired fasting blood sugar. Diagnoses of Adult ADHD, LGSIL on Pap smear of cervix, Elevated blood pressure reading in office without diagnosis of hypertension, GAD (generalized anxiety disorder), and Class 1 obesity due to excess calories without serious comorbidity with body mass index (BMI) of 32.0 to 32.9 in adult were also pertinent to this visit. ? ? ?This visit occurred during the SARS-CoV-2 public health emergency.  Safety protocols were in place, including screening questions prior to the visit, additional usage of staff PPE, and extensive cleaning of exam room while observing appropriate contact time as indicated for disinfecting solutions.   ? ?HPI ?Cynthia Lewis presents for  ?Chief Complaint  ?Patient presents with  ? Follow-up  ?  Follow up on ADHD  ? ?1) ADD :  managed with Vyvanse since 2021 time of diagnosis . Tolerating  with 1 hr of increased "jitteriness" .  Finishing master's degree;  has 3 more classes. Taking them one at a class.  Has one more year.  ? ?2) Obesity: has lost 21 lbs over the past year. ? ?3) Depression :  aggravated by financial stressors since she broke Ankle . Since her ankle has healed she has moved back out  her own  , has been a struggle .  Discussed  resuming  an antidepressant.    Lacking motivation.   ? ? ?Outpatient Medications Prior to Visit  ?Medication Sig Dispense Refill  ? ALPRAZolam (XANAX) 0.25 MG tablet Take 1 tablet (0.25 mg total) by mouth daily as needed for anxiety. 30 tablet 0  ? levonorgestrel (MIRENA) 20 MCG/24HR IUD 1 each by Intrauterine route once.    ? lisdexamfetamine (VYVANSE) 40 MG capsule Take 1 capsule (40 mg total) by mouth daily. 30 capsule 0  ? ondansetron (ZOFRAN-ODT) 4 MG disintegrating tablet DISSOLVE 1 TABLET BY MOUTH EVERY 8 HOURS AS NEEDED FOR NAUSEA OR VOMITING 20 tablet 0  ?  lisdexamfetamine (VYVANSE) 40 MG capsule Take 1 capsule (40 mg total) by mouth daily. 30 capsule 0  ? azithromycin (ZITHROMAX) 250 MG tablet Take 1 tablet (250 mg total) by mouth daily. Take first 2 tablets together, then 1 every day until finished. 6 tablet 0  ? citalopram (CELEXA) 20 MG tablet TAKE 1 TABLET(20 MG) BY MOUTH DAILY (Patient not taking: Reported on 10/24/2021) 90 tablet 3  ? traMADol (ULTRAM) 50 MG tablet Take 50 mg by mouth every 6 (six) hours. (Patient not taking: Reported on 04/18/2021)    ? ?No facility-administered medications prior to visit.  ? ? ?Review of Systems; ? ?Patient denies headache, fevers, malaise, unintentional weight loss, skin rash, eye pain, sinus congestion and sinus pain, sore throat, dysphagia,  hemoptysis , cough, dyspnea, wheezing, chest pain, palpitations, orthopnea, edema, abdominal pain, nausea, melena, diarrhea, constipation, flank pain, dysuria, hematuria, urinary  Frequency, nocturia, numbness, tingling, seizures,  Focal weakness, Loss of consciousness,  Tremor, insomnia, depression, anxiety, and suicidal ideation.   ? ? ? ?Objective:  ?BP 128/86 (BP Location: Left Arm, Patient Position: Sitting, Cuff Size: Large)   Pulse 99   Temp 98.6 ?F (37 ?C) (Oral)   Ht 5\' 5"  (1.651 m)   Wt 261 lb 9.6 oz (118.7 kg)   SpO2 97%   BMI 43.53 kg/m?  ? ?BP Readings from Last 3 Encounters:  ?10/24/21 128/86  ?09/29/21 (!) 137/96  ?04/16/21 118/84  ? ? ?  Wt Readings from Last 3 Encounters:  ?10/24/21 261 lb 9.6 oz (118.7 kg)  ?04/16/21 265 lb 9.6 oz (120.5 kg)  ?01/11/21 268 lb (121.6 kg)  ? ? ?General appearance: alert, cooperative and appears stated age ?Ears: normal TM's and external ear canals both ears ?Throat: lips, mucosa, and tongue normal; teeth and gums normal ?Neck: no adenopathy, no carotid bruit, supple, symmetrical, trachea midline and thyroid not enlarged, symmetric, no tenderness/mass/nodules ?Back: symmetric, no curvature. ROM normal. No CVA tenderness. ?Lungs: clear  to auscultation bilaterally ?Heart: regular rate and rhythm, S1, S2 normal, no murmur, click, rub or gallop ?Abdomen: soft, non-tender; bowel sounds normal; no masses,  no organomegaly ?Pulses: 2+ and symmetric ?Skin: Skin color, texture, turgor normal. No rashes or lesions ?Lymph nodes: Cervical, supraclavicular, and axillary nodes normal. ? ?Lab Results  ?Component Value Date  ? HGBA1C 5.2 09/04/2020  ? HGBA1C 5.2 03/18/2018  ? HGBA1C 5.2 03/26/2017  ? ? ?Lab Results  ?Component Value Date  ? CREATININE 0.93 01/11/2021  ? CREATININE 1.00 09/04/2020  ? CREATININE 1.05 (H) 12/02/2018  ? ? ?Lab Results  ?Component Value Date  ? WBC 16.5 (H) 01/11/2021  ? HGB 14.6 01/11/2021  ? HCT 41.9 01/11/2021  ? PLT 301 01/11/2021  ? GLUCOSE 132 (H) 01/11/2021  ? CHOL 205 (A) 04/14/2021  ? TRIG 212 (A) 04/14/2021  ? HDL 46 09/04/2020  ? LDLCALC 122 04/14/2021  ? ALT 16 01/11/2021  ? AST 17 01/11/2021  ? NA 137 01/11/2021  ? K 4.1 01/11/2021  ? CL 104 01/11/2021  ? CREATININE 0.93 01/11/2021  ? BUN 13 01/11/2021  ? CO2 24 01/11/2021  ? TSH 2.280 09/04/2020  ? INR 1.0 01/11/2021  ? HGBA1C 5.2 09/04/2020  ? ? ?No results found. ? ?Assessment & Plan:  ? ?Problem List Items Addressed This Visit   ? ? Adult ADHD  ? Relevant Medications  ? lisdexamfetamine (VYVANSE) 40 MG capsule  ? Elevated blood pressure reading in office without diagnosis of hypertension  ?  She has no history of hypertension but has had several elevated readings.  She has been asked to check her pressures at home and submit readings for evaluation. Renal function will be checked  ?  ?  ? Relevant Orders  ? Comprehensive metabolic panel  ? Lipid panel  ? Microalbumin / creatinine urine ratio  ? GAD (generalized anxiety disorder)  ?  resuming citalopram starting at 10 mg daily for fist week ?  ?  ? Relevant Medications  ? citalopram (CELEXA) 20 MG tablet  ? LGSIL on Pap smear of cervix  ?  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   colposcopy planned  ?  ?  ? Obesity  ?  I have congratulated her in reduction of   BMI and encouraged  Continued weight loss with goal of 10% of body weigh over the next 6 months using a low glycemic index diet and regular exercise a minimum of 5 days per week.  ? ?  ?  ? Relevant Medications  ? lisdexamfetamine (VYVANSE) 40 MG capsule  ? ?Other Visit Diagnoses   ? ? Impaired fasting blood sugar    -  Primary  ? Relevant Orders  ? Hemoglobin A1c  ? ?  ? ? ?I spent 30 minutes dedicated to the care of this patient on the date of this encounter to include pre-visit review of patient's medical history,  most recent imaging studies,  Face-to-face time with the patient , and post visit ordering of testing and therapeutics.   ? ?Follow-up: No follow-ups on file. ? ? ?Sherlene Shams, MD ?

## 2021-10-24 NOTE — Assessment & Plan Note (Signed)
resuming citalopram starting at 10 mg daily for fist week ?

## 2021-10-24 NOTE — Assessment & Plan Note (Signed)
She has no history of hypertension but has had several elevated readings.  She has been asked to check her pressures at home and submit readings for evaluation. Renal function will be checked  

## 2021-10-24 NOTE — Assessment & Plan Note (Signed)
I have congratulated her in reduction of   BMI and encouraged  Continued weight loss with goal of 10% of body weigh over the next 6 months using a low glycemic index diet and regular exercise a minimum of 5 days per week.    

## 2021-10-24 NOTE — Assessment & Plan Note (Addendum)
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  colposcopy planned  ?

## 2021-10-31 ENCOUNTER — Ambulatory Visit: Payer: Managed Care, Other (non HMO) | Admitting: Internal Medicine

## 2021-11-24 ENCOUNTER — Other Ambulatory Visit: Payer: Self-pay | Admitting: Internal Medicine

## 2021-12-05 ENCOUNTER — Other Ambulatory Visit: Payer: Self-pay | Admitting: Internal Medicine

## 2021-12-05 DIAGNOSIS — F411 Generalized anxiety disorder: Secondary | ICD-10-CM

## 2021-12-18 ENCOUNTER — Encounter: Payer: Self-pay | Admitting: Internal Medicine

## 2022-02-25 IMAGING — CT CT ANKLE*L* W/O CM
3 series · 12 of 33 positions shown, 14 images · non-contrast
Comparison: Same day radiographs

CLINICAL DATA: Left ankle fracture-dislocation status post
reduction

EXAM:
CT OF THE LEFT ANKLE WITHOUT CONTRAST
TECHNIQUE: Multidetector CT imaging of the left ankle was performed according
to the standard protocol. Multiplanar CT image reconstructions were
also generated.

[Series 7: axial st · axial · 0.29mm/px · z∈[+77,+235]mm · 4 of 228 slices shown, 5 images]
[im 35/228  soft-tissue]
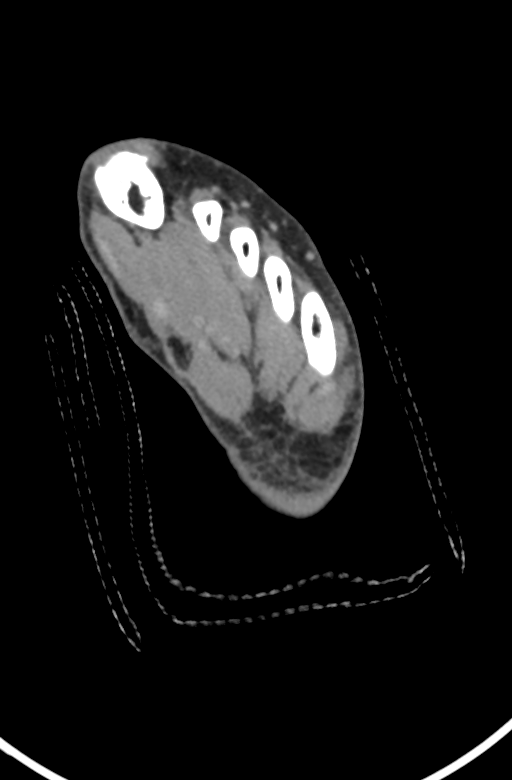
[im 35/228  bone]
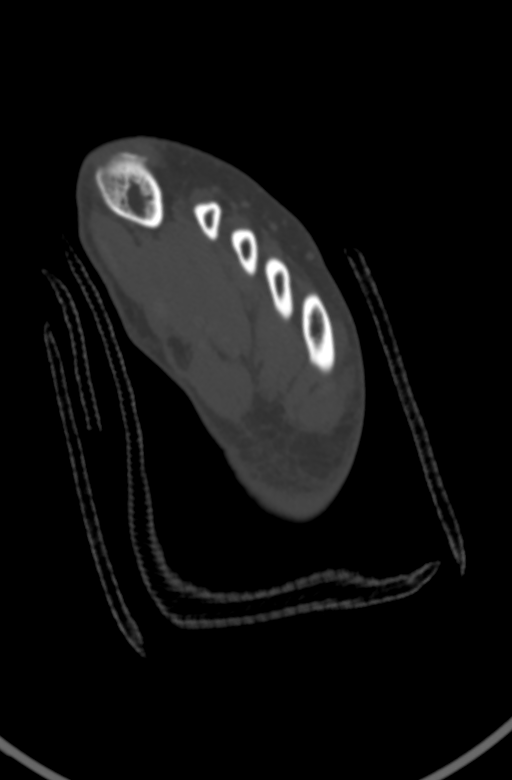
[im 88/228  bone]
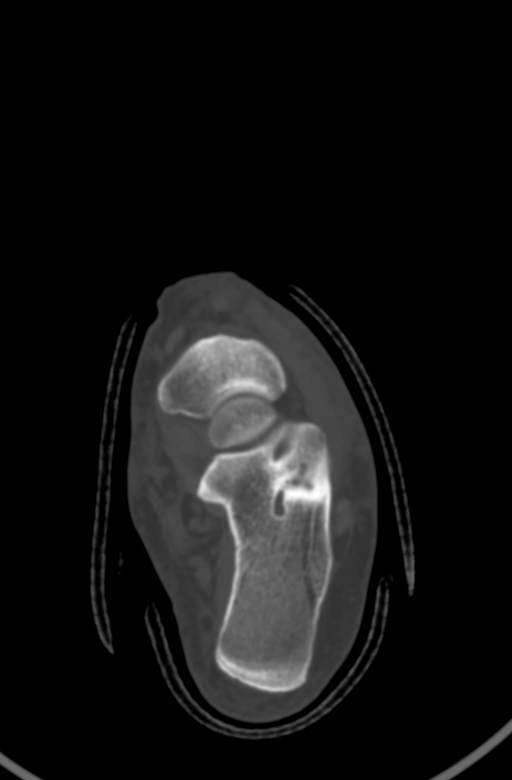
[im 140/228  bone]
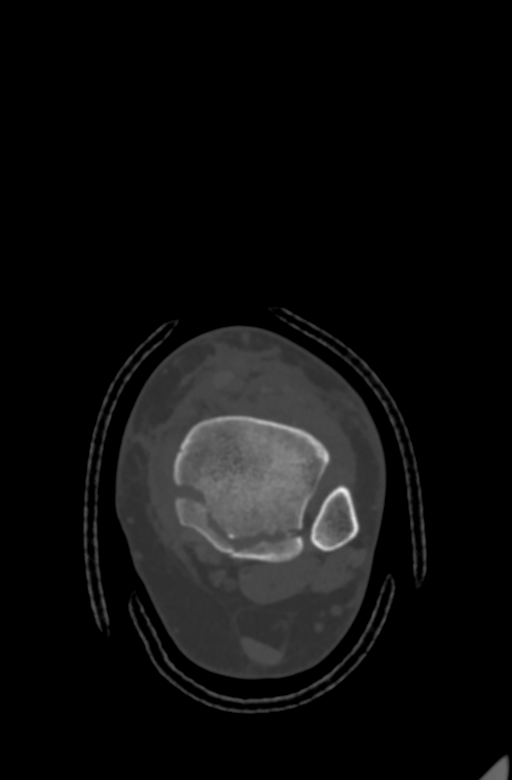
[im 193/228  bone]
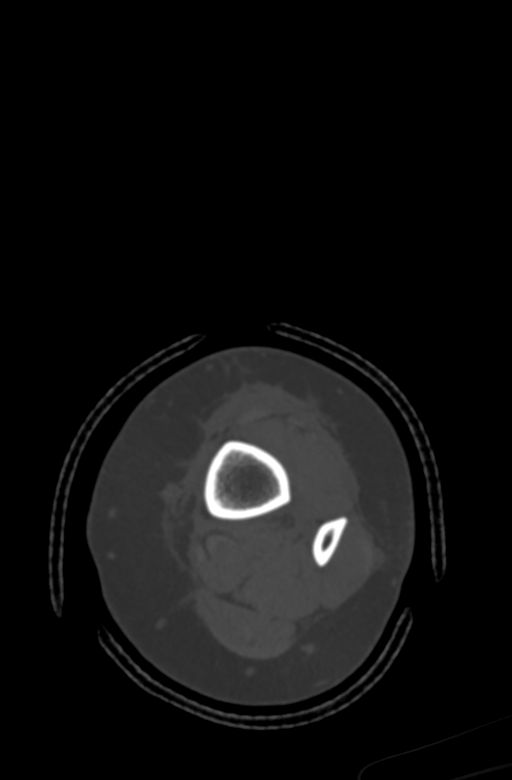

[Series 8: cor st · coronal · 0.27mm/px · 3 of 209 slices shown]
[im 42/209  bone]
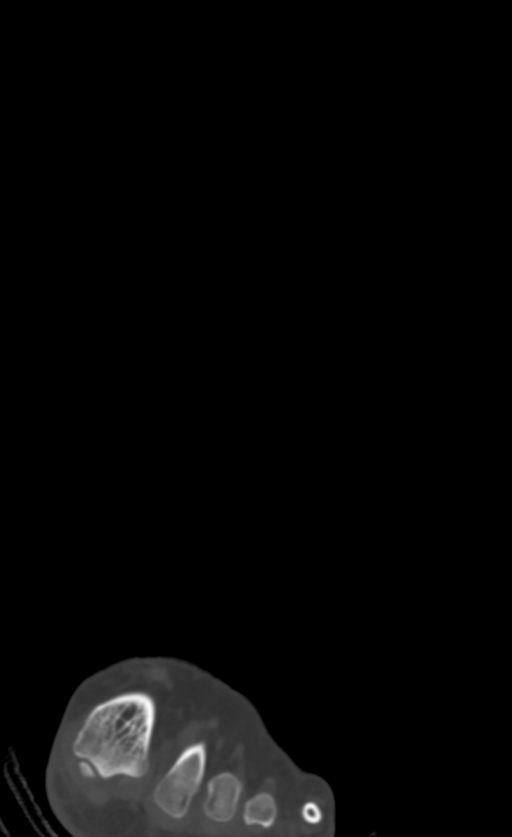
[im 84/209  bone]
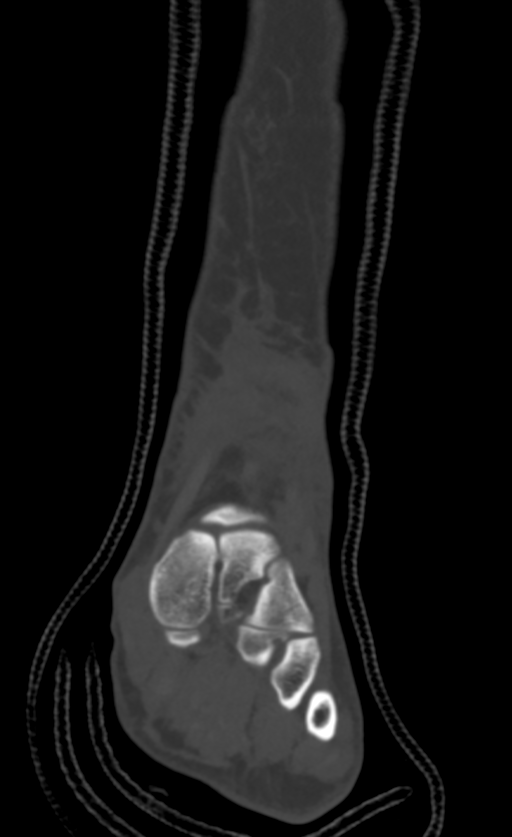
[im 125/209  bone]
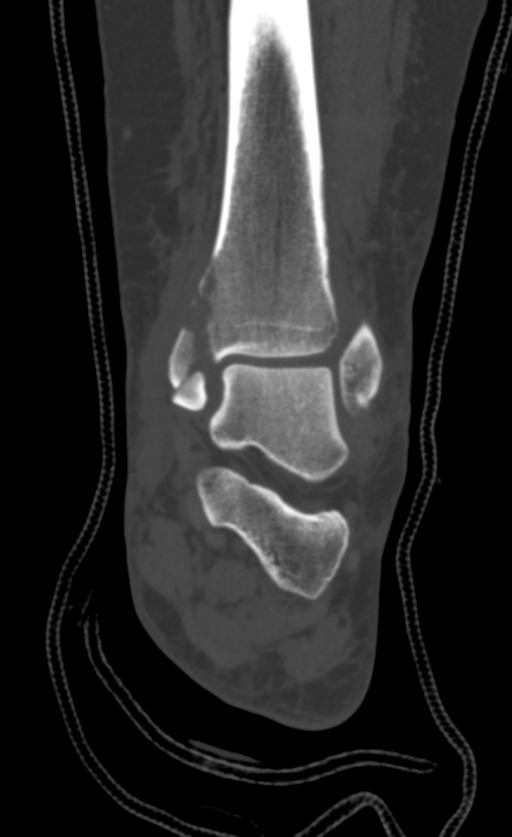

[Series 9: sag st · sagittal · 0.41mm/px · 5 of 141 slices shown, 6 images]
[im 47/141  bone]
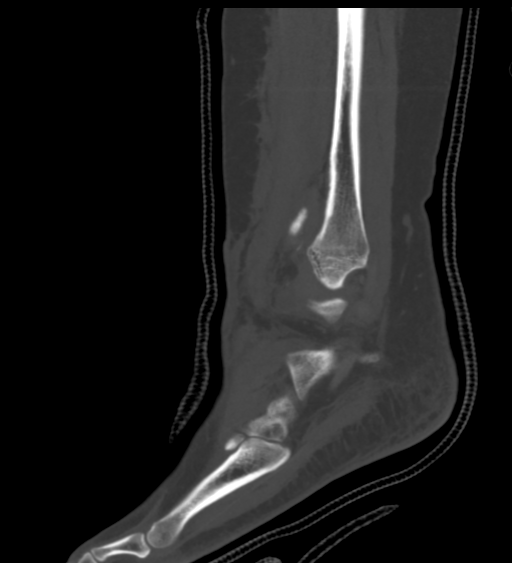
[im 59/141  bone]
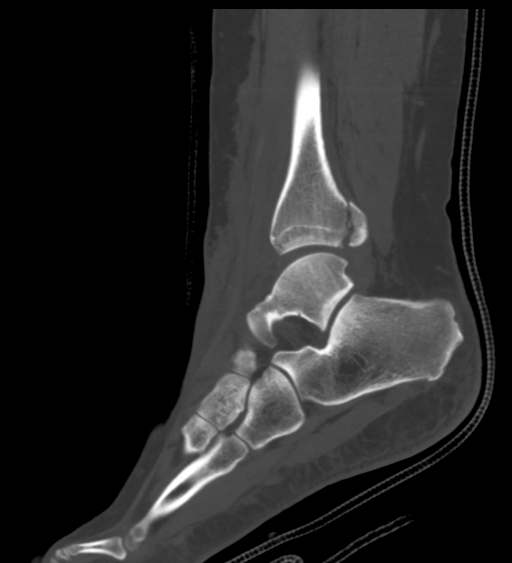
[im 71/141  soft-tissue]
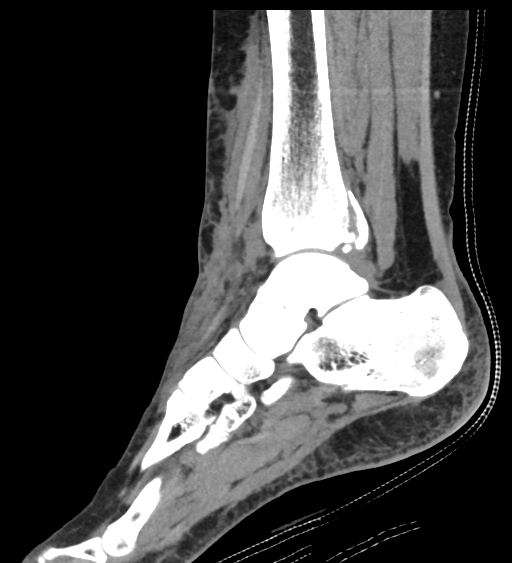
[im 71/141  bone]
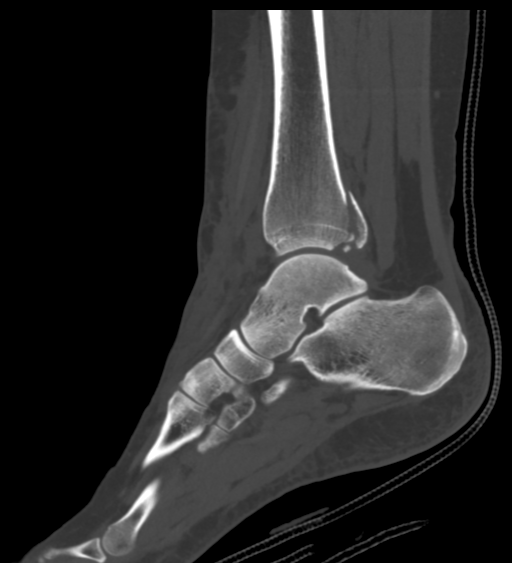
[im 82/141  bone]
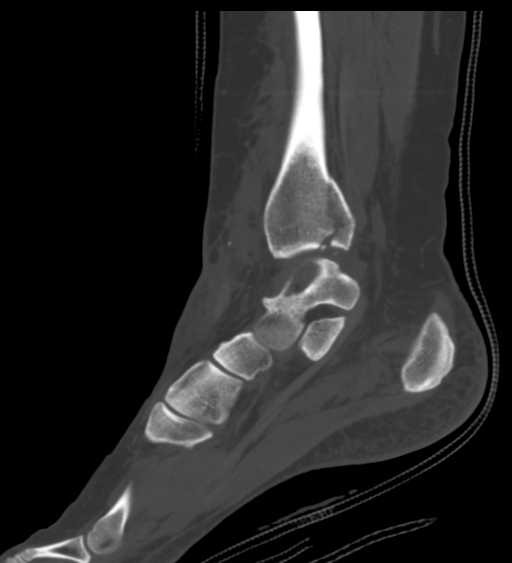
[im 94/141  bone]
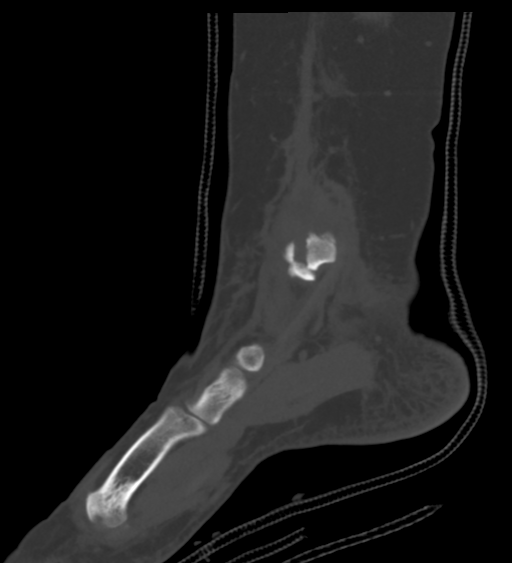

[12 of 33 positions shown; findings below may reference images not displayed]

FINDINGS: Bones/Joint/Cartilage

Acute comminuted bimalleolar left ankle fracture-dislocation status
post reduction. Improved alignment of the ankle mortise. Comminuted
medial malleolar fracture including transversely oriented component
through the base of the medial malleolus is slightly displaced
posteromedially.

Comminuted vertically oriented component through the posterior
malleolus extending through the medial malleolus and into the distal
tibiofibular joint (series 4, image 96). There is mild
posterosuperior displacement of the posterior malleolus. Mild
articular surface depression with multiple small comminuted fracture
fragments along the fracture site as well as a few tiny
intra-articular fracture fragments within the ankle joint displaced
anteriorly.

There is a 4 mm bony avulsion fragment emanating from the
anterolateral rim of the distal tibia which is laterally displaced
by approximately 9 mm, likely reflecting anterior tibiofibular
ligament avulsion injury (series 4, image 98).

Distal fibula is intact without fracture. Distal tibiofibular joint
alignment is maintained.

Talus intact without evidence of fracture. Calcaneus intact. Bones
of the midfoot are intact. TMT joint alignment is maintained. No
significant arthropathy.

Ligaments

Suboptimally assessed by CT.

Muscles and Tendons

No acute musculotendinous injury by CT. No evidence to suggest
tendinous entrapment.

Soft tissues

Diffuse soft tissue swelling. Ill-defined hematoma along the
anterior aspect of the ankle.
IMPRESSION: 1. Acute comminuted bimalleolar left ankle fracture-dislocation
status post reduction, as described above. Improved alignment of the
ankle mortise.
2. Diffuse soft tissue swelling and ill-defined hematoma along the
anterior aspect of the ankle.

## 2022-04-30 ENCOUNTER — Ambulatory Visit: Payer: Managed Care, Other (non HMO) | Admitting: Internal Medicine

## 2022-05-10 ENCOUNTER — Other Ambulatory Visit: Payer: Self-pay | Admitting: Internal Medicine

## 2022-05-27 ENCOUNTER — Encounter: Payer: Self-pay | Admitting: Physician Assistant

## 2022-05-27 ENCOUNTER — Ambulatory Visit (INDEPENDENT_AMBULATORY_CARE_PROVIDER_SITE_OTHER): Payer: 59 | Admitting: Physician Assistant

## 2022-05-27 VITALS — BP 143/101 | HR 82 | Ht 66.0 in | Wt 283.6 lb

## 2022-05-27 DIAGNOSIS — F902 Attention-deficit hyperactivity disorder, combined type: Secondary | ICD-10-CM | POA: Diagnosis not present

## 2022-05-27 DIAGNOSIS — F32A Depression, unspecified: Secondary | ICD-10-CM | POA: Diagnosis not present

## 2022-05-27 DIAGNOSIS — F338 Other recurrent depressive disorders: Secondary | ICD-10-CM | POA: Diagnosis not present

## 2022-05-27 DIAGNOSIS — F411 Generalized anxiety disorder: Secondary | ICD-10-CM | POA: Diagnosis not present

## 2022-05-27 MED ORDER — ALPRAZOLAM 0.25 MG PO TABS: 0.2500 mg | ORAL_TABLET | Freq: Every evening | ORAL | 0 refills | Status: DC | PRN

## 2022-05-27 MED ORDER — CITALOPRAM HYDROBROMIDE 40 MG PO TABS: 40.0000 mg | ORAL_TABLET | Freq: Every day | ORAL | 1 refills | Status: DC

## 2022-05-27 MED ORDER — LISDEXAMFETAMINE DIMESYLATE 20 MG PO CAPS: 20.0000 mg | ORAL_CAPSULE | Freq: Every day | ORAL | 0 refills | Status: DC

## 2022-05-27 NOTE — Progress Notes (Signed)
[facility] MD/PA/NP Initial Note  05/27/2022 2:32 PM Cynthia Lewis  MRN:  [id number]  Chief Complaint:  Chief Complaint   Establish Care    HPI:  Presents to establish care and get medications.  She was seeing another mental health provider who did not want to prescribe stimulants.  She tried Strattera for a while, it was ineffective and when the dose was pushed up to a maximum she had terrible GI symptoms and could not tolerate.  She has a history of ADHD, diagnosed 4-5 years ago.  Was started out on Vyvanse and it has always work.  No official testing.   Has had anxiety off and on for years.  She was prescribed Xanax initially which she would take only on rare occasions.  It did help.  She would often take it in the evening to help her relax to go to sleep, which was beneficial.  She will sometimes have racing thoughts and cannot get her mind to shut off so she can relax to fall asleep so it was helpful for that reason.  Hydroxyzine was tried but that has caused her to be extremely drowsy the next morning.  She has taken Benadryl which helps her relax to go to sleep and it does not cause a hangover.  Was put on Celexa years ago to help with the anxiety.  It has been effective.  "I probably had a little depression during that time too but not anymore."  That was during the time she was in school.  Has tried melatonin in the past but it did not work very well.  At least not consistently, it was hit or miss.  Patient is able to enjoy things.  Energy and motivation are good.  Work is going well works at [employer].  No extreme sadness, tearfulness, or feelings of hopelessness.  ADLs and personal hygiene are normal.   Appetite has not changed.  Weight is stable.  Not isolating.  Does not cry easily.  Denies suicidal or homicidal thoughts.  Visit Diagnosis:    ICD-10-CM   1. Seasonal affective disorder ([place])  F33.8     2. Attention deficit hyperactivity disorder (ADHD), combined type  F90.2      3. Generalized anxiety disorder  F41.1     4. Mild depression  F32.A     5. GAD (generalized anxiety disorder)  F41.1 ALPRAZolam (XANAX) 0.25 MG tablet      Past Psychiatric History:   Past medications for mental health diagnoses include:  Vyvanse, Strattera, Celexa, hydroxyzine, Xanax, melatonin was ineffective  No history of mental health hospitalizations. No history of suicide attempts.   Past Medical History:  Past Medical History:  Diagnosis Date   ADHD    Anxiety    Depression    Hx of dysplastic nevus 2012   Left medial buttock    Hypertension    Hypertension     Past Surgical History:  Procedure Laterality Date   ORIF ANKLE FRACTURE Left 01/12/2021   Procedure: OPEN REDUCTION INTERNAL FIXATION (ORIF) ANKLE FRACTURE;  Surgeon: Thornton Park, MD;  Location: [facility] ORS;  Service: Orthopedics;  Laterality: Left;    Family Psychiatric History: See below  Family History:  Family History  Problem Relation Age of Onset   Hypertension Mother    Anxiety disorder Mother    Ovarian cancer Mother    ADD / ADHD Father    Depression Father    Anxiety disorder Father    Diabetes Father  Hypertension Father    Hyperlipidemia Father    Healthy Brother    Diabetes Maternal Grandfather    Heart failure Maternal Grandfather    Heart failure Maternal Grandmother    Anxiety disorder Maternal Grandmother    Diabetes Paternal Grandfather    Cancer Paternal Grandfather    Cancer Paternal Grandmother 55       breast ca   Diabetes Paternal Grandmother     Social History:  Social History   Socioeconomic History   Marital status: Single    Spouse name: Not on file   Number of children: Not on file   Years of education: Not on file   Highest education level: Not on file  Occupational History   Not on file  Tobacco Use   Smoking status: Never   Smokeless tobacco: Never  Vaping Use   Vaping Use: Never used  Substance and Sexual Activity   Alcohol use: Yes     Comment: OCCASIONALLY   Drug use: No   Sexual activity: Yes    Birth control/protection: I.U.D., None  Other Topics Concern   Not on file  Social History Narrative   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 Also in Masters program at [employer] in [employer].    Grew up in this area, with both parents and younger brother. Mom was a Psychologist, educational at [employer], then middle school Runner, broadcasting/film/video. Dad is a Sr person at [employer]. Coordinates specimen deliveries.       Caffeine-1 per day   Legal-none   Religious-none   Social Determinants of Health   Financial Resource Strain: Low Risk  (05/27/2022)   Overall Financial Resource Strain (CARDIA)    Difficulty of Paying Living Expenses: Not hard at all  Food Insecurity: No Food Insecurity (05/27/2022)   Hunger Vital Sign    Worried About Running Out of Food in the Last Year: Never true    Ran Out of Food in the Last Year: Never true  Transportation Needs: No Transportation Needs (05/27/2022)   PRAPARE - Administrator, Civil Service (Medical): No    Lack of Transportation (Non-Medical): No  Physical Activity: Insufficiently Active (05/27/2022)   Exercise Vital Sign    Days of Exercise per Week: 3 days    Minutes of Exercise per Session: 30 min  Stress: Stress Concern Present (05/27/2022)   [employer] of Occupational Health - Occupational Stress Questionnaire    Feeling of Stress : To some extent  Social Connections: Socially Isolated (05/27/2022)   Social Connection and Isolation Panel [NHANES]    Frequency of Communication with Friends and Family: More than three times a week    Frequency of Social Gatherings with Friends and Family: More than three times a week    Attends Religious Services: Never    Database administrator or Organizations: No    Attends Banker Meetings: Never    Marital Status: Never married    Allergies:  Allergies  Allergen Reactions   Amoxicillin Nausea And Vomiting   Penicillins     Metabolic  Disorder Labs: Lab Results  Component Value Date   HGBA1C 5.2 09/04/2020   No results found for: "PROLACTIN" Lab Results  Component Value Date   CHOL 205 (A) 04/14/2021   TRIG 212 (A) 04/14/2021   HDL 46 09/04/2020   CHOLHDL 4.1 09/04/2020   LDLCALC 122 04/14/2021   LDLCALC 108 (H) 09/04/2020   Lab Results  Component Value Date   TSH 2.280  09/04/2020   TSH 2.870 03/18/2018    Therapeutic Level Labs: No results found for: "LITHIUM" No results found for: "VALPROATE" No results found for: "CBMZ"  Current Medications: Current Outpatient Medications  Medication Sig Dispense Refill   citalopram (CELEXA) 40 MG tablet Take 1 tablet (40 mg total) by mouth daily. 30 tablet 1   levonorgestrel (MIRENA) 20 MCG/24HR IUD 1 each by Intrauterine route once.     lisdexamfetamine (VYVANSE) 20 MG capsule Take 1 capsule (20 mg total) by mouth daily. 30 each 0   ondansetron (ZOFRAN-ODT) 4 MG disintegrating tablet DISSOLVE 1 TABLET ON THE TONGUE EVERY 8 HOURS AS NEEDED FOR NAUSEA OR VOMITING 20 tablet 0   ALPRAZolam (XANAX) 0.25 MG tablet Take 1 tablet (0.25 mg total) by mouth at bedtime as needed for anxiety. 30 tablet 0   No current facility-administered medications for this visit.    Medication Side Effects: none  Orders placed this visit:  No orders of the defined types were placed in this encounter.   Psychiatric Specialty Exam:  Review of Systems  Constitutional: Negative.   HENT: Negative.    Eyes: Negative.   Respiratory: Negative.    Cardiovascular: Negative.   Gastrointestinal: Negative.   Endocrine: Negative.   Genitourinary: Negative.   Musculoskeletal: Negative.   Skin: Negative.   Allergic/Immunologic: Negative.   Neurological:  Positive for headaches.       Occasionally  Hematological: Negative.   Psychiatric/Behavioral:  Positive for decreased concentration and sleep disturbance. The patient is nervous/anxious.        See HPI    Blood pressure (!) 143/101,  pulse 82, height 5\' 6"  (1.676 m), weight 283 lb 9.6 oz (128.6 kg).Body mass index is 45.77 kg/m.  General Appearance: Casual, Well Groomed, and obese  Eye Contact:  Good  Speech:  Clear and Coherent and Normal Rate  Volume:  Normal  Mood:  Anxious  Affect:  Congruent  Thought Process:  Goal Directed and Descriptions of Associations: Circumstantial  Orientation:  Full (Time, Place, and Person)  Thought Content: Logical   Suicidal Thoughts:  No  Homicidal Thoughts:  No  Memory:  WNL  Judgement:  Good  Insight:  Good  Psychomotor Activity:  Normal  Concentration:  Concentration: Fair and Attention Span: Fair  Recall:  Good  Fund of Knowledge: Good  Language: Good  Assets:  Desire for Improvement Financial Resources/Insurance Housing Transportation Vocational/Educational  ADL's:  Intact  Cognition: WNL  Prognosis:  Good   Screenings:  GAD-7    Flowsheet Row Office Visit from 02/24/2016 in [facility]  Total GAD-7 Score 19      PHQ2-9    Flowsheet Row Office Visit from 05/27/2022 in [facility] Office Visit from 10/24/2021 in [facility] Office Visit from 04/16/2021 in [facility] Office Visit from 12/04/2020 in [facility] Office Visit from 09/25/2019 in [facility]   PHQ-2 Total Score 0 2 1 1 1   PHQ-9 Total Score -- 6 -- 4 9      Flowsheet Row ED from 09/29/2021 in [facility]  ED to Hosp-Admission (Discharged) from 01/11/2021 in [facility] (1[facility])  C-SSRS RISK CATEGORY No Risk No Risk       Receiving Psychotherapy: No   Treatment Plan/Recommendations:  PDMP reviewed.  Xanax filled 12/09/2021.  Vyvanse filled 11/10/2021. I provided 60 minutes of face to face time during this encounter, including time spent before  and after the visit in records review, medical decision making, counseling pertinent to today's  visit, and charting.   She responded well to the Vyvanse so I will prescribe that.  I will start at 20 mg and she can increase the dose to 40 mg in 4 to 5 days if the 20 mg is not enough.  I understand she will run out early, she will call and I will send in a prescription for 40 mg.  She verbalizes understanding.  As far as the anxiety goes it is fine to have Xanax on hand but she understands it is not commonly prescribed along with a stimulant.  As long as she is not taking it very often, I do not mind prescribing a few.  Will continue the Celexa which helps anxiety and depression.  She has been off of the Celexa for a couple of weeks so will restart at 20 mg and then increase to 40 mg in a week to 2 weeks.  She verbalizes understanding.  Her blood pressure is elevated today, it is hard to say if this is white coat hypertension or an issue.  Patient understands she needs to see her PCP about this.    Restart Xanax 0.25 mg, 1 p.o. nightly as needed anxiety. Restart Celexa 40 mg, one half daily for 1 week then increase to 1 p.o. daily. Restart Vyvanse 20 mg, 1 p.o. every morning but may increase to 2 p.o. every morning and 3 to 4 days if needed. Return in 6 weeks.   Melony Overly, PA-C

## 2022-06-03 ENCOUNTER — Encounter: Payer: Self-pay | Admitting: Internal Medicine

## 2022-06-03 DIAGNOSIS — R030 Elevated blood-pressure reading, without diagnosis of hypertension: Secondary | ICD-10-CM

## 2022-06-03 NOTE — Telephone Encounter (Signed)
FYI

## 2022-06-04 NOTE — Assessment & Plan Note (Signed)
Needs rn visit for BP check and home machine calibration

## 2022-06-04 NOTE — Telephone Encounter (Signed)
Needs rn visit for BP check and home machine calibration  

## 2022-06-10 ENCOUNTER — Ambulatory Visit: Payer: Managed Care, Other (non HMO)

## 2022-06-10 VITALS — BP 126/90 | HR 100

## 2022-06-10 DIAGNOSIS — R030 Elevated blood-pressure reading, without diagnosis of hypertension: Secondary | ICD-10-CM

## 2022-06-10 MED ORDER — METOPROLOL SUCCINATE ER 50 MG PO TB24: 50.0000 mg | ORAL_TABLET | Freq: Every day | ORAL | 0 refills | Status: DC

## 2022-06-10 NOTE — Assessment & Plan Note (Signed)
Starting toprol XL 50 mg dail.  rtc 2 weeks  for BP check

## 2022-06-10 NOTE — Addendum Note (Signed)
Addended by: Crecencio Mc on: 06/10/2022 05:23 PM   Modules accepted: Orders

## 2022-06-10 NOTE — Progress Notes (Signed)
Patient here for nurse visit BP check per order from [facility] 06/03/22.   Patient reports compliance with prescribed BP medications: no, not on any hypertensive medicine.  Last dose of BP medication: Does not apply (N/A)  BP Readings from Last 3 Encounters:  06/10/22 (!) 126/90  10/24/21 128/86  09/29/21 (!) 137/96   Pulse Readings from Last 3 Encounters:  06/10/22 100  10/24/21 99  09/29/21 99  Pt arrived for BP check manually & with pt's machine. Pt's first initial readings per below were done on L arm both manually & automatically. L arm (Man):130/90  L arm (Auto):159/102 PR:104   PR:94 O2:97%  I had pt sit & relax for about 10-15 mins before rechecking BP once more manually & automatically in L arm. Second reading manually per above. Automatically per below. 2nd reading (auto): 151/98 PR:100  Informed pt that readings would be sent to PCP for review & CMA would be reaching out to pt in regards to next steps & or change in therapy.   Patient verbalized understanding of instructions.   Elita Quick Dimas,CMA

## 2022-06-15 ENCOUNTER — Telehealth: Payer: Self-pay | Admitting: Physician Assistant

## 2022-06-15 ENCOUNTER — Other Ambulatory Visit: Payer: Self-pay | Admitting: Physician Assistant

## 2022-06-15 MED ORDER — LISDEXAMFETAMINE DIMESYLATE 40 MG PO CAPS: 40.0000 mg | ORAL_CAPSULE | ORAL | 0 refills | Status: DC

## 2022-06-15 NOTE — Telephone Encounter (Signed)
Please review

## 2022-06-15 NOTE — Telephone Encounter (Addendum)
Pt called reporting she is advising TH the generic Vyvanse 20 mg is not enough. Requesting 40 mg. Stated she was to report on this dose. [place]. Apt 11/16    Pt mentioned she wants to get this before [facility] and [facility] go on strike next week.

## 2022-06-15 NOTE — Telephone Encounter (Signed)
Please let her know I sent in the prescription for 40 mg.  Thank you.

## 2022-06-15 NOTE — Telephone Encounter (Signed)
Pt informed

## 2022-06-26 ENCOUNTER — Ambulatory Visit: Payer: Managed Care, Other (non HMO) | Admitting: Internal Medicine

## 2022-06-26 ENCOUNTER — Encounter: Payer: Self-pay | Admitting: Internal Medicine

## 2022-06-26 VITALS — BP 134/84 | HR 77 | Temp 98.4°F | Ht 66.0 in | Wt 278.8 lb

## 2022-06-26 DIAGNOSIS — R635 Abnormal weight gain: Secondary | ICD-10-CM

## 2022-06-26 DIAGNOSIS — Z23 Encounter for immunization: Secondary | ICD-10-CM

## 2022-06-26 DIAGNOSIS — R7301 Impaired fasting glucose: Secondary | ICD-10-CM | POA: Diagnosis not present

## 2022-06-26 DIAGNOSIS — R030 Elevated blood-pressure reading, without diagnosis of hypertension: Secondary | ICD-10-CM | POA: Diagnosis not present

## 2022-06-26 DIAGNOSIS — I10 Essential (primary) hypertension: Secondary | ICD-10-CM

## 2022-06-26 DIAGNOSIS — F909 Attention-deficit hyperactivity disorder, unspecified type: Secondary | ICD-10-CM

## 2022-06-26 MED ORDER — AMLODIPINE BESYLATE 2.5 MG PO TABS: 2.5000 mg | ORAL_TABLET | Freq: Every day | ORAL | 0 refills | Status: DC

## 2022-06-26 NOTE — Progress Notes (Unsigned)
Subjective:  Patient ID: Cynthia Lewis, female    DOB: 19-Jul-1986  Age: 36 y.o. MRN: [id number]  CC: There were no encounter diagnoses.   HPI Cynthia Lewis presents for No chief complaint on file.  ADD:  currently taking vyvanse now at 40 mg daily.  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  Not effective . Tolerating vyvanse without side effects this time.  No restlessnes,   GAD:  using alprazolam  and celexa.  Work is stressful  , lots of brainwork,  meetings,  deadlines.  Works in systems conrol:  Insurance claims handler     Obesity:  working fro  home.  Cooks a lot.  Stress   eats.  Craves salt.   Walking for exercise.      Outpatient Medications Prior to Visit  Medication Sig Dispense Refill   lisdexamfetamine (VYVANSE) 40 MG capsule Take 1 capsule (40 mg total) by mouth every morning. 30 each 0   ALPRAZolam (XANAX) 0.25 MG tablet Take 1 tablet (0.25 mg total) by mouth at bedtime as needed for anxiety. 30 tablet 0   citalopram (CELEXA) 40 MG tablet Take 1 tablet (40 mg total) by mouth daily. 30 tablet 1   levonorgestrel (MIRENA) 20 MCG/24HR IUD 1 each by Intrauterine route once.     metoprolol succinate (TOPROL-XL) 50 MG 24 hr tablet Take 1 tablet (50 mg total) by mouth daily. Take with or immediately following a meal. 90 tablet 0   ondansetron (ZOFRAN-ODT) 4 MG disintegrating tablet DISSOLVE 1 TABLET ON THE TONGUE EVERY 8 HOURS AS NEEDED FOR NAUSEA OR VOMITING 20 tablet 0   No facility-administered medications prior to visit.    Review of Systems;  Patient denies headache, fevers, malaise, unintentional weight loss, skin rash, eye pain, sinus congestion and sinus pain, sore throat, dysphagia,  hemoptysis , cough, dyspnea, wheezing, chest pain, palpitations, orthopnea, edema, abdominal pain, nausea, melena, diarrhea, constipation, flank pain, dysuria, hematuria, urinary  Frequency, nocturia, numbness, tingling, seizures,  Focal weakness, Loss of consciousness,  Tremor,  insomnia, depression, anxiety, and suicidal ideation.      Objective:  There were no vitals taken for this visit.  BP Readings from Last 3 Encounters:  06/10/22 (!) 126/90  05/27/22 (!) 143/101  10/24/21 128/86    Wt Readings from Last 3 Encounters:  05/27/22 283 lb 9.6 oz (128.6 kg)  10/24/21 261 lb 9.6 oz (118.7 kg)  04/16/21 265 lb 9.6 oz (120.5 kg)    General appearance: alert, cooperative and appears stated age Ears: normal TM's and external ear canals both ears Throat: lips, mucosa, and tongue normal; teeth and gums normal Neck: no adenopathy, no carotid bruit, supple, symmetrical, trachea midline and thyroid not enlarged, symmetric, no tenderness/mass/nodules Back: symmetric, no curvature. ROM normal. No CVA tenderness. Lungs: clear to auscultation bilaterally Heart: regular rate and rhythm, S1, S2 normal, no murmur, click, rub or gallop Abdomen: soft, non-tender; bowel sounds normal; no masses,  no organomegaly Pulses: 2+ and symmetric Skin: Skin color, texture, turgor normal. No rashes or lesions Lymph nodes: Cervical, supraclavicular, and axillary nodes normal. Neuro:  awake and interactive with normal mood and affect. Higher cortical functions are normal. Speech is clear without word-finding difficulty or dysarthria. Extraocular movements are intact. Visual fields of both eyes are grossly intact. Sensation to light touch is grossly intact bilaterally of upper and lower extremities. Motor examination shows 4+/5 symmetric hand grip and upper extremity and 5/5 lower extremity strength. There is no pronation or drift.  Gait is non-ataxic   Lab Results  Component Value Date   HGBA1C 5.2 09/04/2020   HGBA1C 5.2 03/18/2018   HGBA1C 5.2 03/26/2017    Lab Results  Component Value Date   CREATININE 0.93 01/11/2021   CREATININE 1.00 09/04/2020   CREATININE 1.05 (H) 12/02/2018    Lab Results  Component Value Date   WBC 16.5 (H) 01/11/2021   HGB 14.6 01/11/2021   HCT  41.9 01/11/2021   PLT 301 01/11/2021   GLUCOSE 132 (H) 01/11/2021   CHOL 205 (A) 04/14/2021   TRIG 212 (A) 04/14/2021   HDL 46 09/04/2020   LDLCALC 122 04/14/2021   ALT 16 01/11/2021   AST 17 01/11/2021   NA 137 01/11/2021   K 4.1 01/11/2021   CL 104 01/11/2021   CREATININE 0.93 01/11/2021   BUN 13 01/11/2021   CO2 24 01/11/2021   TSH 2.280 09/04/2020   INR 1.0 01/11/2021   HGBA1C 5.2 09/04/2020    No results found.  Assessment & Plan:   Problem List Items Addressed This Visit   None   I spent a total of   minutes with this patient in a face to face visit on the date of this encounter reviewing the last office visit with me in       ,  most recent visit with cardiology ,    ,  patient's diet and exercise habits, home blood pressure /blod sugar readings, recent ER visit including labs and imaging studies ,   and post visit ordering of testing and therapeutics.    Follow-up: No follow-ups on file.   Sherlene Shams, MD

## 2022-06-26 NOTE — Patient Instructions (Signed)
For blood pressure:  Continue metoprolol 50 mg at night  Adding 2.5 mg amlodipine daily IN THE MORNING   Goal BP is 130/80 or less.  You can increase to 5 mg daily after one week if not at goal  Most common side effect is fluid retention

## 2022-06-27 LAB — COMPREHENSIVE METABOLIC PANEL
ALT: 11 IU/L (ref 0–32)
AST: 16 IU/L (ref 0–40)
Albumin/Globulin Ratio: 1.6 (ref 1.2–2.2)
Albumin: 4.3 g/dL (ref 3.9–4.9)
Alkaline Phosphatase: 81 IU/L (ref 44–121)
BUN/Creatinine Ratio: 13 (ref 9–23)
BUN: 12 mg/dL (ref 6–20)
Bilirubin Total: 0.4 mg/dL (ref 0.0–1.2)
CO2: 23 mmol/L (ref 20–29)
Calcium: 9.5 mg/dL (ref 8.7–10.2)
Chloride: 102 mmol/L (ref 96–106)
Creatinine, Ser: 0.95 mg/dL (ref 0.57–1.00)
Globulin, Total: 2.7 g/dL (ref 1.5–4.5)
Glucose: 87 mg/dL (ref 70–99)
Potassium: 4.2 mmol/L (ref 3.5–5.2)
Sodium: 139 mmol/L (ref 134–144)
Total Protein: 7 g/dL (ref 6.0–8.5)
eGFR: 80 mL/min/{1.73_m2} (ref >59)

## 2022-06-27 LAB — LIPID PANEL
Chol/HDL Ratio: 4.1 ratio (ref 0.0–4.4)
Cholesterol, Total: 191 mg/dL (ref 100–199)
HDL: 47 mg/dL (ref >39)
LDL Chol Calc (NIH): 94 mg/dL (ref 0–99)
Triglycerides: 302 mg/dL — ABNORMAL HIGH (ref 0–149)
VLDL Cholesterol Cal: 50 mg/dL — ABNORMAL HIGH (ref 5–40)

## 2022-06-27 LAB — HEMOGLOBIN A1C
Est. average glucose Bld gHb Est-mCnc: 103 mg/dL
Hgb A1c MFr Bld: 5.2 % (ref 4.8–5.6)

## 2022-06-27 LAB — MICROALBUMIN / CREATININE URINE RATIO
Creatinine, Urine: 121.4 mg/dL
Microalb/Creat Ratio: <2 mg/g creat (ref 0–29)
Microalbumin, Urine: <3 ug/mL

## 2022-06-27 LAB — TSH: TSH: 2.53 u[IU]/mL (ref 0.450–4.500)

## 2022-06-27 NOTE — Assessment & Plan Note (Signed)
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

## 2022-06-27 NOTE — Assessment & Plan Note (Signed)
Not at goal on metoprolol 50 mg  Adding amlodipine 2.5 mg daily

## 2022-07-01 ENCOUNTER — Ambulatory Visit: Payer: Managed Care, Other (non HMO) | Admitting: Internal Medicine

## 2022-07-06 ENCOUNTER — Ambulatory Visit
Admission: RE | Admit: 2022-07-06 | Discharge: 2022-07-06 | Disposition: A | Discharge status: Home / Self Care | Payer: Managed Care, Other (non HMO) | Source: Ambulatory Visit | Attending: Emergency Medicine | Admitting: Emergency Medicine

## 2022-07-06 VITALS — BP 123/88 | HR 101 | Temp 98.8°F | Resp 18 | Ht 66.0 in | Wt 275.0 lb

## 2022-07-06 DIAGNOSIS — N390 Urinary tract infection, site not specified: Secondary | ICD-10-CM | POA: Insufficient documentation

## 2022-07-06 LAB — POCT URINALYSIS DIP (MANUAL ENTRY)
Bilirubin, UA: NEGATIVE
Glucose, UA: 100 mg/dL — AB
Nitrite, UA: POSITIVE — AB
Protein Ur, POC: 30 mg/dL — AB
Spec Grav, UA: 1.015 (ref 1.010–1.025)
Urobilinogen, UA: 2 E.U./dL — AB
pH, UA: 5 (ref 5.0–8.0)

## 2022-07-06 LAB — POCT URINE PREGNANCY: Preg Test, Ur: NEGATIVE

## 2022-07-06 MED ORDER — CEPHALEXIN 500 MG PO CAPS: 500.0000 mg | ORAL_CAPSULE | Freq: Two times a day (BID) | ORAL | 0 refills | Status: AC

## 2022-07-06 NOTE — ED Provider Notes (Signed)
[name]    CSN: [id number] Arrival date & time: 07/06/22  1735      History   Chief Complaint Chief Complaint  Patient presents with   Urinary Frequency    Entered by patient    HPI Cynthia Lewis is a 36 y.o. female.  Patient presents with dysuria and urinary frequency x1 day.  No fever, chills, abdominal pain, flank pain, hematuria, vaginal discharge, pelvic pain, or other symptoms.  Treatment at home with Azo.  The history is provided by the patient and medical records.    Past Medical History:  Diagnosis Date   ADHD    Anxiety    Depression    Hx of dysplastic nevus 2012   Left medial buttock    Hypertension    Hypertension     Patient Active Problem List   Diagnosis Date Noted   Essential hypertension 10/24/2021   LGSIL on Pap smear of cervix 04/16/2021   Closed left ankle fracture 01/11/2021   Vertigo 09/11/2020   Adult ADHD 03/01/2020   Migraine headache 06/10/2018   Bilateral chronic knee pain 01/15/2018   GAD (generalized anxiety disorder) 11/05/2012   Routine general medical examination at a health care facility 11/05/2012   Obesity 08/05/2011   Screening for cervical cancer 08/05/2011    Past Surgical History:  Procedure Laterality Date   ORIF ANKLE FRACTURE Left 01/12/2021   Procedure: OPEN REDUCTION INTERNAL FIXATION (ORIF) ANKLE FRACTURE;  Surgeon: Juanell Fairly, MD;  Location: [facility] ORS;  Service: Orthopedics;  Laterality: Left;    OB History   No obstetric history on file.      Home Medications    Prior to Admission medications   Medication Sig Start Date End Date Taking? Authorizing Provider  cephALEXin (KEFLEX) 500 MG capsule Take 1 capsule (500 mg total) by mouth 2 (two) times daily for 5 days. 07/06/22 07/11/22 Yes Mickie Bail, NP  ALPRAZolam Prudy Feeler) 0.25 MG tablet Take 1 tablet (0.25 mg total) by mouth at bedtime as needed for anxiety. 05/27/22   Melony Overly T, PA-C  amLODipine (NORVASC) 2.5 MG tablet Take 1  tablet (2.5 mg total) by mouth daily. 06/26/22   Sherlene Shams, MD  citalopram (CELEXA) 40 MG tablet Take 1 tablet (40 mg total) by mouth daily. 05/27/22   Cherie Ouch, PA-C  levonorgestrel (MIRENA) 20 MCG/24HR IUD 1 each by Intrauterine route once. 11/11/12   [provider]  lisdexamfetamine (VYVANSE) 40 MG capsule Take 1 capsule (40 mg total) by mouth every morning. 06/15/22   Melony Overly T, PA-C  metoprolol succinate (TOPROL-XL) 50 MG 24 hr tablet Take 1 tablet (50 mg total) by mouth daily. Take with or immediately following a meal. 06/10/22   Sherlene Shams, MD  ondansetron (ZOFRAN-ODT) 4 MG disintegrating tablet DISSOLVE 1 TABLET ON THE TONGUE EVERY 8 HOURS AS NEEDED FOR NAUSEA OR VOMITING 11/24/21   Sherlene Shams, MD    Family History Family History  Problem Relation Age of Onset   Hypertension Mother    Anxiety disorder Mother    Ovarian cancer Mother    ADD / ADHD Father    Depression Father    Anxiety disorder Father    Diabetes Father    Hypertension Father    Hyperlipidemia Father    Healthy Brother    Diabetes Maternal Grandfather    Heart failure Maternal Grandfather    Heart failure Maternal Grandmother    Anxiety disorder Maternal Grandmother  Diabetes Paternal Grandfather    Cancer Paternal Grandfather    Cancer Paternal Grandmother 47       breast ca   Diabetes Paternal Grandmother     Social History Social History   Tobacco Use   Smoking status: Never   Smokeless tobacco: Never  Vaping Use   Vaping Use: Never used  Substance Use Topics   Alcohol use: Yes    Comment: OCCASIONALLY   Drug use: No     Allergies   Amoxicillin and Penicillins   Review of Systems Review of Systems  Constitutional:  Negative for chills and fever.  Gastrointestinal:  Negative for abdominal pain, nausea and vomiting.  Genitourinary:  Positive for dysuria and frequency. Negative for flank pain, hematuria, pelvic pain and vaginal discharge.  Skin:   Negative for rash.  All other systems reviewed and are negative.    Physical Exam Triage Vital Signs ED Triage Vitals  Enc Vitals Group     BP 07/06/22 1753 123/88     Pulse --      Resp --      Temp --      Temp src --      SpO2 --      Weight 07/06/22 1752 275 lb (124.7 kg)     Height 07/06/22 1752 5\' 6"  (1.676 m)     Head Circumference --      Peak Flow --      Pain Score 07/06/22 1751 0     Pain Loc --      Pain Edu? --      Excl. in GC? --    No data found.  Updated Vital Signs BP 123/88   Pulse (!) 101   Temp 98.8 F (37.1 C)   Resp 18   Ht 5\' 6"  (1.676 m)   Wt 275 lb (124.7 kg)   SpO2 97%   BMI 44.39 kg/m   Visual Acuity Right Eye Distance:   Left Eye Distance:   Bilateral Distance:    Right Eye Near:   Left Eye Near:    Bilateral Near:     Physical Exam Vitals and nursing note reviewed.  Constitutional:      General: She is not in acute distress.    Appearance: Normal appearance. She is well-developed. She is not ill-appearing.  HENT:     Mouth/Throat:     Mouth: Mucous membranes are moist.  Cardiovascular:     Rate and Rhythm: Normal rate and regular rhythm.     Heart sounds: Normal heart sounds.  Pulmonary:     Effort: Pulmonary effort is normal. No respiratory distress.     Breath sounds: Normal breath sounds.  Abdominal:     General: Bowel sounds are normal.     Palpations: Abdomen is soft.     Tenderness: There is no abdominal tenderness. There is no right CVA tenderness, left CVA tenderness, guarding or rebound.  Musculoskeletal:     Cervical back: Neck supple.  Skin:    General: Skin is warm and dry.  Neurological:     Mental Status: She is alert.  Psychiatric:        Mood and Affect: Mood normal.        Behavior: Behavior normal.      UC Treatments / Results  Labs (all labs ordered are listed, but only abnormal results are displayed) Labs Reviewed  POCT URINALYSIS DIP (MANUAL ENTRY) - Abnormal; Notable for the following  components:  Result Value   Color, UA orange (*)    Clarity, UA cloudy (*)    Glucose, UA =100 (*)    Ketones, POC UA small (15) (*)    Blood, UA trace-lysed (*)    Protein Ur, POC =30 (*)    Urobilinogen, UA 2.0 (*)    Nitrite, UA Positive (*)    Leukocytes, UA Large (3+) (*)    All other components within normal limits  URINE CULTURE  POCT URINE PREGNANCY    EKG   Radiology No results found.  Procedures Procedures (including critical care time)  Medications Ordered in UC Medications - No data to display  Initial Impression / Assessment and Plan / UC Course  I have reviewed the triage vital signs and the nursing notes.  Pertinent labs & imaging results that were available during my care of the patient were reviewed by me and considered in my medical decision making (see chart for details).    UTI.  Treating with Keflex. Urine culture pending. Discussed with patient that we will call her if the urine culture shows the need to change or discontinue the antibiotic. Instructed her to follow-up with her PCP if her symptoms are not improving. Patient agrees to plan of care.     Final Clinical Impressions(s) / UC Diagnoses   Final diagnoses:  Urinary tract infection without hematuria, site unspecified     Discharge Instructions      Take the antibiotic as directed.  The urine culture is pending.  We will call you if it shows the need to change or discontinue your antibiotic.    Follow up with your primary care provider if your symptoms are not improving.        ED Prescriptions     Medication Sig Dispense Auth. Provider   cephALEXin (KEFLEX) 500 MG capsule Take 1 capsule (500 mg total) by mouth 2 (two) times daily for 5 days. 10 capsule Mickie Bail, NP      PDMP not reviewed this encounter.   Mickie Bail, NP 07/06/22 1911

## 2022-07-06 NOTE — ED Triage Notes (Signed)
Patient to Urgent Care with complaints of urinary frequency/ urgency that started yesterday. Reports pain with urination. Denies any abdominal pain/ flank pain.  Has been taking azo with some relief. Denies any known fevers.

## 2022-07-06 NOTE — Discharge Instructions (Signed)
Take the antibiotic as directed.  The urine culture is pending.  We will call you if it shows the need to change or discontinue your antibiotic.    Follow up with your primary care provider if your symptoms are not improving.    

## 2022-07-09 ENCOUNTER — Encounter: Payer: Self-pay | Admitting: Physician Assistant

## 2022-07-09 ENCOUNTER — Ambulatory Visit (INDEPENDENT_AMBULATORY_CARE_PROVIDER_SITE_OTHER): Payer: 59 | Admitting: Physician Assistant

## 2022-07-09 DIAGNOSIS — F411 Generalized anxiety disorder: Secondary | ICD-10-CM | POA: Diagnosis not present

## 2022-07-09 DIAGNOSIS — F902 Attention-deficit hyperactivity disorder, combined type: Secondary | ICD-10-CM | POA: Diagnosis not present

## 2022-07-09 DIAGNOSIS — F338 Other recurrent depressive disorders: Secondary | ICD-10-CM | POA: Diagnosis not present

## 2022-07-09 LAB — URINE CULTURE: Culture: >=100000 — AB

## 2022-07-09 MED ORDER — LISDEXAMFETAMINE DIMESYLATE 40 MG PO CAPS: 40.0000 mg | ORAL_CAPSULE | ORAL | 0 refills | Status: DC

## 2022-07-09 MED ORDER — CITALOPRAM HYDROBROMIDE 40 MG PO TABS: 40.0000 mg | ORAL_TABLET | Freq: Every day | ORAL | 3 refills | Status: DC

## 2022-07-09 NOTE — Progress Notes (Signed)
[facility] Check  Patient ID: Cynthia Lewis,  MRN: [id number]  PCP: Sherlene Shams, MD  Date of Evaluation: 07/09/2022 Time spent:25 minutes  Chief Complaint:  Chief Complaint   Anxiety; Depression; ADHD; Follow-up   Virtual Visit via Telehealth  I connected with patient by a video enabled telemedicine application with their informed consent, and verified patient privacy and that I am speaking with the correct person using two identifiers.  I am private, in my office and the patient is at home.  I discussed the limitations, risks, security and privacy concerns of performing an evaluation and management service by telephone and the availability of in person appointments. I also discussed with the patient that there may be a patient responsible charge related to this service. The patient expressed understanding and agreed to proceed.   I discussed the assessment and treatment plan with the patient. The patient was provided an opportunity to ask questions and all were answered. The patient agreed with the plan and demonstrated an understanding of the instructions.   The patient was advised to call back or seek an in-person evaluation if the symptoms worsen or if the condition fails to improve as anticipated.  I provided 25 minutes of non-face-to-face time during this encounter.  HISTORY/CURRENT STATUS: HPI 6 week med check.  At her initial visit 6 weeks ago we restarted Vyvanse and Celexa.  She has responded well to the Celexa.  She is able to enjoy things.  Energy and motivation are good.  ADLs and personal hygiene are normal.  Appetite is normal and weight is stable.  Work is going okay.  She will be starting school in January.  No suicidal or homicidal thoughts.  She is responding well to the Vyvanse, although it does seem to wear off in the early afternoon.  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  She  does have anxiety at times and has taken the Xanax a few times since we saw each other.  She does not take it often and understands the upper/downer issue.  Not having panic attacks just gets overwhelmed at times.  Patient denies increased energy with decreased need for sleep, increased talkativeness, racing thoughts, impulsivity or risky behaviors, increased spending, increased libido, grandiosity, increased irritability or anger, paranoia, or hallucinations.  Denies dizziness, syncope, seizures, numbness, tingling, tremor, tics, unsteady gait, slurred speech, confusion. Denies muscle or joint pain, stiffness, or dystonia.  Individual Medical History/ Review of Systems: Changes? :Yes   has a UTI  Past Psychiatric History:    Past medications for mental health diagnoses include:  Vyvanse, Strattera, Celexa, hydroxyzine, Xanax, melatonin was ineffective   No history of mental health hospitalizations. No history of suicide attempts.  Allergies: Amoxicillin and Penicillins  Current Medications:  Current Outpatient Medications:    ALPRAZolam (XANAX) 0.25 MG tablet, Take 1 tablet (0.25 mg total) by mouth at bedtime as needed for anxiety., Disp: 30 tablet, Rfl: 0   amLODipine (NORVASC) 2.5 MG tablet, Take 1 tablet (2.5 mg total) by mouth daily., Disp: 90 tablet, Rfl: 0   cephALEXin (KEFLEX) 500 MG capsule, Take 1 capsule (500 mg total) by mouth 2 (two) times daily for 5 days., Disp: 10 capsule, Rfl: 0   levonorgestrel (MIRENA) 20 MCG/24HR IUD, 1 each by Intrauterine route once., Disp: , Rfl:    [START ON 08/13/2022] lisdexamfetamine (VYVANSE) 40 MG capsule, Take 1 capsule (40 mg total) by  mouth every morning., Disp: 30 capsule, Rfl: 0   metoprolol succinate (TOPROL-XL) 50 MG 24 hr tablet, Take 1 tablet (50 mg total) by mouth daily. Take with or immediately following a meal., Disp: 90 tablet, Rfl: 0   ondansetron (ZOFRAN-ODT) 4 MG disintegrating tablet, DISSOLVE 1 TABLET ON THE TONGUE EVERY 8  HOURS AS NEEDED FOR NAUSEA OR VOMITING, Disp: 20 tablet, Rfl: 0   citalopram (CELEXA) 40 MG tablet, Take 1 tablet (40 mg total) by mouth daily., Disp: 90 tablet, Rfl: 3   [START ON 07/15/2022] lisdexamfetamine (VYVANSE) 40 MG capsule, Take 1 capsule (40 mg total) by mouth every morning., Disp: 30 capsule, Rfl: 0 Medication Side Effects: none  Family Medical/ Social History: Changes? No  MENTAL HEALTH EXAM:  There were no vitals taken for this visit.There is no height or weight on file to calculate BMI.  General Appearance:  unable to assess  Eye Contact:   unable to assess  Speech:  Clear and Coherent and Normal Rate  Volume:  Normal  Mood:  Euthymic  Affect:   unable to assess  Thought Process:  Goal Directed and Descriptions of Associations: Circumstantial  Orientation:  Full (Time, Place, and Person)  Thought Content: Logical   Suicidal Thoughts:  No  Homicidal Thoughts:  No  Memory:  WNL  Judgement:  Good  Insight:  Good  Psychomotor Activity:   unable to assess  Concentration:  Concentration: Good  Recall:  Good  Fund of Knowledge: Good  Language: Good  Assets:  Desire for Improvement Financial Resources/Insurance Housing Transportation Vocational/Educational  ADL's:  Intact  Cognition: WNL  Prognosis:  Good   DIAGNOSES:    ICD-10-CM   1. Seasonal affective disorder (HCC)  F33.8     2. Attention deficit hyperactivity disorder (ADHD), combined type  F90.2     3. Generalized anxiety disorder  F41.1      Receiving Psychotherapy: No   RECOMMENDATIONS:  PDMP reviewed.  Vyvanse filled 06/15/2022.  Xanax filled 05/27/2022. I provided 25 minutes of non-face-to-face time during this encounter, including time spent before and after the visit in records review, medical decision making, counseling pertinent to today's visit, and charting.   She is doing well so no change in treatment at this time. She understands to take the Xanax sparingly.  Continue Xanax 0.25 mg, 1  p.o. daily as needed anxiety.  Takes sparingly. Continue Celexa 40 mg, 1 p.o. daily. Continue Vyvanse 40 mg, 1 p.o. every morning. Return in 2 months.  Donnal Moat, PA-C

## 2022-09-06 ENCOUNTER — Other Ambulatory Visit: Payer: Self-pay | Admitting: Internal Medicine

## 2022-09-08 ENCOUNTER — Ambulatory Visit (INDEPENDENT_AMBULATORY_CARE_PROVIDER_SITE_OTHER): Payer: 59 | Admitting: Physician Assistant

## 2022-09-08 ENCOUNTER — Encounter: Payer: Self-pay | Admitting: Physician Assistant

## 2022-09-08 DIAGNOSIS — F902 Attention-deficit hyperactivity disorder, combined type: Secondary | ICD-10-CM

## 2022-09-08 DIAGNOSIS — F338 Other recurrent depressive disorders: Secondary | ICD-10-CM | POA: Diagnosis not present

## 2022-09-08 DIAGNOSIS — F411 Generalized anxiety disorder: Secondary | ICD-10-CM

## 2022-09-08 MED ORDER — LISDEXAMFETAMINE DIMESYLATE 40 MG PO CAPS: 40.0000 mg | ORAL_CAPSULE | ORAL | 0 refills | Status: DC

## 2022-09-08 MED ORDER — AMPHETAMINE-DEXTROAMPHETAMINE 10 MG PO TABS: 10.0000 mg | ORAL_TABLET | Freq: Every day | ORAL | 0 refills | Status: DC | PRN

## 2022-09-08 MED ORDER — ALPRAZOLAM 0.25 MG PO TABS: 0.2500 mg | ORAL_TABLET | Freq: Every evening | ORAL | 0 refills | Status: DC | PRN

## 2022-09-08 NOTE — Progress Notes (Signed)
[facility]  Patient ID: Cynthia Lewis,  MRN: [id number]  PCP: Crecencio Mc, MD  Date of Evaluation: 09/08/2022 Time spent:25 minutes  Chief Complaint:  Chief Complaint   Anxiety; Depression; ADD; Follow-up   Virtual Visit via Telehealth  I connected with patient by a video enabled telemedicine application with their informed consent, and verified patient privacy and that I am speaking with the correct person using two identifiers.  I am private, in my office and the patient is at home.  I discussed the limitations, risks, security and privacy concerns of performing an evaluation and management service by telephone and the availability of in person appointments. I also discussed with the patient that there may be a patient responsible charge related to this service. The patient expressed understanding and agreed to proceed.   I discussed the assessment and treatment plan with the patient. The patient was provided an opportunity to ask questions and all were answered. The patient agreed with the plan and demonstrated an understanding of the instructions.   The patient was advised to call back or seek an in-person evaluation if the symptoms worsen or if the condition fails to improve as anticipated.  I provided 25 minutes of non-face-to-face time during this encounter.  HISTORY/CURRENT STATUS: HPI  For routine med check.  Vyvanse is still effective but it doesn't last all day. Maybe 6-8 hours at the most. Because she works and is in [employer], she needs something that helps her focus and stay on task until at least 8 PM.  She has never tried any other stimulants, has always been on Vyvanse.  Patient is able to enjoy things.  Energy and motivation are good.  Work is going well.   No extreme sadness, tearfulness, or feelings of hopelessness.  Sleeps well most of the time. ADLs and personal hygiene are normal.   Appetite has not changed.  Weight is stable.  Does have  anxiety from time to time, takes Xanax sparingly.  It is effective.  She has taken approximately 20 pills in 3 months.  Denies suicidal or homicidal thoughts.  Patient denies increased energy with decreased need for sleep, increased talkativeness, racing thoughts, impulsivity or risky behaviors, increased spending, increased libido, grandiosity, increased irritability or anger, paranoia, or hallucinations.  Denies dizziness, syncope, seizures, numbness, tingling, tremor, tics, unsteady gait, slurred speech, confusion. Denies muscle or joint pain, stiffness, or dystonia.  Individual Medical History/ Review of Systems: Changes? :No     Past Psychiatric History:    Past medications for mental health diagnoses include:  Vyvanse, Strattera, Celexa, hydroxyzine, Xanax, melatonin was ineffective   No history of mental health hospitalizations. No history of suicide attempts.  Allergies: Amoxicillin and Penicillins  Current Medications:  Current Outpatient Medications:    amLODipine (NORVASC) 2.5 MG tablet, Take 1 tablet (2.5 mg total) by mouth daily., Disp: 90 tablet, Rfl: 0   amphetamine-dextroamphetamine (ADDERALL) 10 MG tablet, Take 1 tablet (10 mg total) by mouth daily as needed. In the afternoon prn, Disp: 30 tablet, Rfl: 0   citalopram (CELEXA) 40 MG tablet, Take 1 tablet (40 mg total) by mouth daily., Disp: 90 tablet, Rfl: 3   levonorgestrel (MIRENA) 20 MCG/24HR IUD, 1 each by Intrauterine route once., Disp: , Rfl:    lisdexamfetamine (VYVANSE) 40 MG capsule, Take 1 capsule (40 mg total) by mouth every morning., Disp: 30 capsule, Rfl: 0   metoprolol succinate (TOPROL-XL) 50 MG 24 hr tablet, TAKE 1 TABLET(50 MG) BY MOUTH DAILY  WITH OR IMMEDIATELY FOLLOWING A MEAL, Disp: 90 tablet, Rfl: 0   Multiple Vitamin (MULTIVITAMIN ADULT PO), Take by mouth., Disp: , Rfl:    ondansetron (ZOFRAN-ODT) 4 MG disintegrating tablet, DISSOLVE 1 TABLET ON THE TONGUE EVERY 8 HOURS AS NEEDED FOR NAUSEA OR  VOMITING, Disp: 20 tablet, Rfl: 0   ALPRAZolam (XANAX) 0.25 MG tablet, Take 1 tablet (0.25 mg total) by mouth at bedtime as needed for anxiety., Disp: 30 tablet, Rfl: 0   lisdexamfetamine (VYVANSE) 40 MG capsule, Take 1 capsule (40 mg total) by mouth every morning., Disp: 30 capsule, Rfl: 0 Medication Side Effects: none  Family Medical/ Social History: Changes? No  MENTAL HEALTH EXAM:  There were no vitals taken for this visit.There is no height or weight on file to calculate BMI.  General Appearance:  unable to assess  Eye Contact:   unable to assess  Speech:  Clear and Coherent and Normal Rate  Volume:  Normal  Mood:  Euthymic  Affect:   unable to assess  Thought Process:  Goal Directed and Descriptions of Associations: Circumstantial  Orientation:  Full (Time, Place, and Person)  Thought Content: Logical   Suicidal Thoughts:  No  Homicidal Thoughts:  No  Memory:  WNL  Judgement:  Good  Insight:  Good  Psychomotor Activity:   unable to assess  Concentration:  Concentration: Good and Attention Span: Fair  Recall:  Good  Fund of Knowledge: Good  Language: Good  Assets:  Desire for Improvement Financial Resources/Insurance Housing Transportation Vocational/Educational  ADL's:  Intact  Cognition: WNL  Prognosis:  Good   DIAGNOSES:    ICD-10-CM   1. Attention deficit hyperactivity disorder (ADHD), combined type  F90.2     2. GAD (generalized anxiety disorder)  F41.1 ALPRAZolam (XANAX) 0.25 MG tablet    3. Seasonal affective disorder ([place])  F33.8      Receiving Psychotherapy: No   RECOMMENDATIONS:  PDMP reviewed.  Vyvanse filled 07/20/2022.  Xanax filled 05/27/2022. I provided 25 minutes of non-face-to-face time during this encounter, including time spent before and after the visit in records review, medical decision making, counseling pertinent to today's visit, and charting.   We discussed adding a short acting stimulant, she can take around 2 PM or so, give or take  a few hours to get the most effective length of time that she needs it.  She understands that she can do that.  If she takes it too late however she may have trouble sleeping.  I recommend Adderall.  She can take it as needed and if desired and it is effective, she can take 1/2 pill at lunch and then 1/2 pill later in the afternoon.  Continue Xanax 0.25 mg, 1 p.o. daily as needed anxiety.  Takes sparingly. Start Adderall 10 mg, 1 p.o. q. afternoon as needed. Continue Celexa 40 mg, 1 p.o. daily. Continue Vyvanse 40 mg, 1 p.o. every morning. Return in 4 weeks.  Donnal Moat, PA-C

## 2022-09-23 ENCOUNTER — Other Ambulatory Visit: Payer: Self-pay | Admitting: Internal Medicine

## 2022-10-06 ENCOUNTER — Encounter: Payer: Self-pay | Admitting: Physician Assistant

## 2022-10-06 ENCOUNTER — Ambulatory Visit (INDEPENDENT_AMBULATORY_CARE_PROVIDER_SITE_OTHER): Payer: 59 | Admitting: Physician Assistant

## 2022-10-06 DIAGNOSIS — F338 Other recurrent depressive disorders: Secondary | ICD-10-CM

## 2022-10-06 DIAGNOSIS — F5105 Insomnia due to other mental disorder: Secondary | ICD-10-CM

## 2022-10-06 DIAGNOSIS — F411 Generalized anxiety disorder: Secondary | ICD-10-CM

## 2022-10-06 DIAGNOSIS — F902 Attention-deficit hyperactivity disorder, combined type: Secondary | ICD-10-CM

## 2022-10-06 MED ORDER — LISDEXAMFETAMINE DIMESYLATE 40 MG PO CAPS: 40.0000 mg | ORAL_CAPSULE | ORAL | 0 refills | Status: DC

## 2022-10-06 MED ORDER — HYDROXYZINE HCL 10 MG PO TABS: 10.0000 mg | ORAL_TABLET | Freq: Three times a day (TID) | ORAL | 2 refills | Status: DC | PRN

## 2022-10-06 NOTE — Progress Notes (Signed)
[facility]  Patient ID: Cynthia Lewis,  MRN: [id number]  PCP: Crecencio Mc, MD  Date of Evaluation: 10/06/2022 Time spent:25 minutes  Chief Complaint:  Chief Complaint   ADD; Depression; Anxiety; Follow-up   Virtual Visit via Telehealth  I connected with patient by a telephone, with their informed consent, and verified patient privacy and that I am speaking with the correct person using two identifiers.  I am private, in my office and the patient is at work.  I discussed the limitations, risks, security and privacy concerns of performing an evaluation and management service by telephone and the availability of in person appointments. I also discussed with the patient that there may be a patient responsible charge related to this service. The patient expressed understanding and agreed to proceed.   I discussed the assessment and treatment plan with the patient. The patient was provided an opportunity to ask questions and all were answered. The patient agreed with the plan and demonstrated an understanding of the instructions.   The patient was advised to call back or seek an in-person evaluation if the symptoms worsen or if the condition fails to improve as anticipated.  I provided 25 minutes of non-face-to-face time during this encounter.  HISTORY/CURRENT STATUS: HPI  For routine med check.  We added Adderall in the afternoon a month ago. She has taken it a few times and did notice a difference with her focus and concentration.  She forgets to take it in the middle of the day though and is nervous that she may take it too late and it will cause insomnia.  She already has issues with sleep.  A former provider prescribed hydroxyzine as needed.  That is helpful.  Patient is able to enjoy things.  Energy and motivation are good.  Work is going well.   No extreme sadness, tearfulness, or feelings of hopelessness. ADLs and personal hygiene are normal.   Appetite has not  changed.  Weight is stable.  Rarely has anxiety and needs the Xanax.  Denies suicidal or homicidal thoughts.  Patient denies increased energy with decreased need for sleep, increased talkativeness, racing thoughts, impulsivity or risky behaviors, increased spending, increased libido, grandiosity, increased irritability or anger, paranoia, or hallucinations.  Denies dizziness, syncope, seizures, numbness, tingling, tremor, tics, unsteady gait, slurred speech, confusion. Denies muscle or joint pain, stiffness, or dystonia.  Individual Medical History/ Review of Systems: Changes? :No     Past Psychiatric History:    Past medications for mental health diagnoses include:  Vyvanse, Strattera, Celexa, hydroxyzine, Xanax, melatonin was ineffective   No history of mental health hospitalizations. No history of suicide attempts.  Allergies: Amoxicillin and Penicillins  Current Medications:  Current Outpatient Medications:    ALPRAZolam (XANAX) 0.25 MG tablet, Take 1 tablet (0.25 mg total) by mouth at bedtime as needed for anxiety., Disp: 30 tablet, Rfl: 0   amLODipine (NORVASC) 2.5 MG tablet, TAKE 1 TABLET(2.5 MG) BY MOUTH DAILY, Disp: 90 tablet, Rfl: 0   amphetamine-dextroamphetamine (ADDERALL) 10 MG tablet, Take 1 tablet (10 mg total) by mouth daily as needed. In the afternoon prn, Disp: 30 tablet, Rfl: 0   citalopram (CELEXA) 40 MG tablet, Take 1 tablet (40 mg total) by mouth daily., Disp: 90 tablet, Rfl: 3   levonorgestrel (MIRENA) 20 MCG/24HR IUD, 1 each by Intrauterine route once., Disp: , Rfl:    lisdexamfetamine (VYVANSE) 40 MG capsule, Take 1 capsule (40 mg total) by mouth every morning., Disp: 30 capsule, Rfl: 0  metoprolol succinate (TOPROL-XL) 50 MG 24 hr tablet, TAKE 1 TABLET(50 MG) BY MOUTH DAILY WITH OR IMMEDIATELY FOLLOWING A MEAL, Disp: 90 tablet, Rfl: 0   Multiple Vitamin (MULTIVITAMIN ADULT PO), Take by mouth., Disp: , Rfl:    ondansetron (ZOFRAN-ODT) 4 MG disintegrating tablet,  DISSOLVE 1 TABLET ON THE TONGUE EVERY 8 HOURS AS NEEDED FOR NAUSEA OR VOMITING, Disp: 20 tablet, Rfl: 0   hydrOXYzine (ATARAX) 10 MG tablet, Take 1-2 tablets (10-20 mg total) by mouth 3 (three) times daily as needed for anxiety (or sleep)., Disp: 60 tablet, Rfl: 2   [START ON 11/03/2022] lisdexamfetamine (VYVANSE) 40 MG capsule, Take 1 capsule (40 mg total) by mouth every morning., Disp: 30 capsule, Rfl: 0   [START ON 12/05/2022] lisdexamfetamine (VYVANSE) 40 MG capsule, Take 1 capsule (40 mg total) by mouth every morning., Disp: 30 capsule, Rfl: 0 Medication Side Effects: none  Family Medical/ Social History: Changes? No  MENTAL HEALTH EXAM:  There were no vitals taken for this visit.There is no height or weight on file to calculate BMI.  General Appearance:  unable to assess  Eye Contact:   unable to assess  Speech:  Clear and Coherent and Normal Rate  Volume:  Normal  Mood:  Euthymic  Affect:   unable to assess  Thought Process:  Goal Directed and Descriptions of Associations: Circumstantial  Orientation:  Full (Time, Place, and Person)  Thought Content: Logical   Suicidal Thoughts:  No  Homicidal Thoughts:  No  Memory:  WNL  Judgement:  Good  Insight:  Good  Psychomotor Activity:   unable to assess  Concentration:  Concentration: Good and Attention Span: Good  Recall:  Good  Fund of Knowledge: Good  Language: Good  Assets:  Desire for Improvement Financial Resources/Insurance Housing Transportation Vocational/Educational  ADL's:  Intact  Cognition: WNL  Prognosis:  Good   DIAGNOSES:    ICD-10-CM   1. Attention deficit hyperactivity disorder (ADHD), combined type  F90.2     2. GAD (generalized anxiety disorder)  F41.1     3. Seasonal affective disorder ([place])  F33.8     4. Insomnia due to other mental disorder  F51.05    F99       Receiving Psychotherapy: No   RECOMMENDATIONS:  PDMP reviewed.  Vyvanse filled 09/05/2022.  Xanax filled 09/08/2022.  Adderall filled  09/08/2022. I provided 25 minutes of non-face-to-face time during this encounter, including time spent before and after the visit in records review, medical decision making, counseling pertinent to today's visit, and charting.   Discussed the Adderall.  She will set an alarm for the afternoon so she will remember to take the Adderall. Sleep hygiene discussed.  Continue Xanax 0.25 mg, 1 p.o. daily as needed anxiety.  Takes sparingly. Continue Adderall 10 mg, 1 p.o. q. afternoon as needed. Continue Celexa 40 mg, 1 p.o. daily. Continue hydroxyzine 10 mg, 1-2 3 times daily as needed anxiety or sleep. Continue Vyvanse 40 mg, 1 p.o. every morning. Return in 3 months.  Donnal Moat, PA-C

## 2022-12-11 ENCOUNTER — Encounter (HOSPITAL_COMMUNITY): Payer: Self-pay

## 2022-12-11 NOTE — Telephone Encounter (Signed)
Opened in error.  Wrong patient .

## 2023-01-01 ENCOUNTER — Ambulatory Visit: Payer: 59 | Admitting: Physician Assistant

## 2023-01-01 ENCOUNTER — Encounter: Payer: Self-pay | Admitting: Physician Assistant

## 2023-01-01 DIAGNOSIS — F411 Generalized anxiety disorder: Secondary | ICD-10-CM | POA: Diagnosis not present

## 2023-01-01 DIAGNOSIS — F439 Reaction to severe stress, unspecified: Secondary | ICD-10-CM

## 2023-01-01 DIAGNOSIS — F902 Attention-deficit hyperactivity disorder, combined type: Secondary | ICD-10-CM | POA: Diagnosis not present

## 2023-01-01 DIAGNOSIS — F99 Mental disorder, not otherwise specified: Secondary | ICD-10-CM

## 2023-01-01 DIAGNOSIS — F32A Depression, unspecified: Secondary | ICD-10-CM

## 2023-01-01 DIAGNOSIS — F5105 Insomnia due to other mental disorder: Secondary | ICD-10-CM

## 2023-01-01 MED ORDER — LISDEXAMFETAMINE DIMESYLATE 40 MG PO CAPS: 40.0000 mg | ORAL_CAPSULE | ORAL | 0 refills | Status: DC

## 2023-01-01 MED ORDER — ALPRAZOLAM 0.25 MG PO TABS: 0.2500 mg | ORAL_TABLET | Freq: Every evening | ORAL | 0 refills | Status: DC | PRN

## 2023-01-01 NOTE — Progress Notes (Unsigned)
[facility]  Patient ID: Cynthia Lewis,  MRN: [id number]  PCP: Sherlene Shams, MD  Date of Evaluation: 01/01/2023 Time spent:25 minutes  Chief Complaint:  Chief Complaint   Anxiety; Depression; ADD; Follow-up    HISTORY/CURRENT STATUS: HPI  For routine med check.  Under some stress, her BF's ex wife is causing issues about custody. Work/school are ok, just a lot going on. Not having PA but does get anxious in general at times.  Xanax is helpful when needed, she mostly takes it in the evening to help slow her mind down so she can relax and go to sleep.  She is also taking hydroxyzine for that as needed as well.  Patient is able to enjoy things.  Energy and motivation are good.  No extreme sadness, tearfulness, or feelings of hopelessness.  ADLs and personal hygiene are normal.   Appetite has not changed.  Weight is stable.  Denies laxative use, calorie restricting, or binging and purging.   Denies cutting or any form of self-harm.  Denies suicidal or homicidal thoughts.  Vyvanse is still effective.  States that attention is good without easy distractibility.  Able to focus on things and finish tasks to completion.   Patient denies increased energy with decreased need for sleep, increased talkativeness, racing thoughts, impulsivity or risky behaviors, increased spending, increased libido, grandiosity, increased irritability or anger, paranoia, or hallucinations.  Denies dizziness, syncope, seizures, numbness, tingling, tremor, tics, unsteady gait, slurred speech, confusion. Denies muscle or joint pain, stiffness, or dystonia.  Individual Medical History/ Review of Systems: Changes? :No     Past Psychiatric History:    Past medications for mental health diagnoses include:  Vyvanse, Strattera, Celexa, hydroxyzine, Xanax, melatonin was ineffective   No history of mental health hospitalizations. No history of suicide attempts.  Allergies: Amoxicillin and  Penicillins  Current Medications:  Current Outpatient Medications:    amLODipine (NORVASC) 2.5 MG tablet, TAKE 1 TABLET(2.5 MG) BY MOUTH DAILY, Disp: 90 tablet, Rfl: 0   amphetamine-dextroamphetamine (ADDERALL) 10 MG tablet, Take 1 tablet (10 mg total) by mouth daily as needed. In the afternoon prn, Disp: 30 tablet, Rfl: 0   citalopram (CELEXA) 40 MG tablet, Take 1 tablet (40 mg total) by mouth daily., Disp: 90 tablet, Rfl: 3   hydrOXYzine (ATARAX) 10 MG tablet, Take 1-2 tablets (10-20 mg total) by mouth 3 (three) times daily as needed for anxiety (or sleep)., Disp: 60 tablet, Rfl: 2   levonorgestrel (MIRENA) 20 MCG/24HR IUD, 1 each by Intrauterine route once., Disp: , Rfl:    metoprolol succinate (TOPROL-XL) 50 MG 24 hr tablet, TAKE 1 TABLET(50 MG) BY MOUTH DAILY WITH OR IMMEDIATELY FOLLOWING A MEAL, Disp: 90 tablet, Rfl: 0   Multiple Vitamin (MULTIVITAMIN ADULT PO), Take by mouth., Disp: , Rfl:    ondansetron (ZOFRAN-ODT) 4 MG disintegrating tablet, DISSOLVE 1 TABLET ON THE TONGUE EVERY 8 HOURS AS NEEDED FOR NAUSEA OR VOMITING, Disp: 20 tablet, Rfl: 0   ALPRAZolam (XANAX) 0.25 MG tablet, Take 1 tablet (0.25 mg total) by mouth at bedtime as needed for anxiety., Disp: 30 tablet, Rfl: 0   lisdexamfetamine (VYVANSE) 40 MG capsule, Take 1 capsule (40 mg total) by mouth every morning., Disp: 30 capsule, Rfl: 0   [START ON 01/31/2023] lisdexamfetamine (VYVANSE) 40 MG capsule, Take 1 capsule (40 mg total) by mouth every morning., Disp: 30 capsule, Rfl: 0   [START ON 03/01/2023] lisdexamfetamine (VYVANSE) 40 MG capsule, Take 1 capsule (40 mg total) by mouth every  morning., Disp: 30 capsule, Rfl: 0 Medication Side Effects: none  Family Medical/ Social History: Changes? No  MENTAL HEALTH EXAM:  There were no vitals taken for this visit.There is no height or weight on file to calculate BMI.  General Appearance: Casual and Well Groomed  Eye Contact:  Good  Speech:  Clear and Coherent and Normal Rate   Volume:  Normal  Mood:  Euthymic  Affect:  Congruent  Thought Process:  Goal Directed and Descriptions of Associations: Circumstantial  Orientation:  Full (Time, Place, and Person)  Thought Content: Logical   Suicidal Thoughts:  No  Homicidal Thoughts:  No  Memory:  WNL  Judgement:  Good  Insight:  Good  Psychomotor Activity:  Normal  Concentration:  Concentration: Good and Attention Span: Good  Recall:  Good  Fund of Knowledge: Good  Language: Good  Assets:  Desire for Improvement Financial Resources/Insurance Housing Transportation Vocational/Educational  ADL's:  Intact  Cognition: WNL  Prognosis:  Good   DIAGNOSES:    ICD-10-CM   1. Attention deficit hyperactivity disorder (ADHD), combined type  F90.2     2. GAD (generalized anxiety disorder)  F41.1 ALPRAZolam (XANAX) 0.25 MG tablet    3. Insomnia due to other mental disorder  F51.05    F99     4. Stress at home  F43.9     5. Mild depression  F32.A      Receiving Psychotherapy: No   RECOMMENDATIONS:  PDMP reviewed.  Vyvanse filled 11/16/2022.  Xanax filled 09/08/2022.  Adderall filled 09/08/2022. I provided 20 minutes of face to face time during this encounter, including time spent before and after the visit in records review, medical decision making, counseling pertinent to today's visit, and charting.   She is doing well overall so no changes needed.  Continue Xanax 0.25 mg, 1 p.o. daily as needed anxiety.  Takes sparingly. Continue Adderall 10 mg, 1 p.o. q. afternoon as needed. Continue Celexa 40 mg, 1 p.o. daily. Continue hydroxyzine 10 mg, 1-2 3 times daily as needed anxiety or sleep. Continue Vyvanse 40 mg, 1 p.o. every morning. Return in 6 months.  Melony Overly, PA-C

## 2023-01-11 ENCOUNTER — Other Ambulatory Visit: Payer: Self-pay | Admitting: Internal Medicine

## 2023-01-14 ENCOUNTER — Encounter: Payer: Self-pay | Admitting: Internal Medicine

## 2023-01-14 ENCOUNTER — Ambulatory Visit: Payer: Managed Care, Other (non HMO) | Admitting: Internal Medicine

## 2023-01-14 VITALS — BP 148/98 | HR 72 | Ht 66.0 in | Wt 291.0 lb

## 2023-01-14 DIAGNOSIS — R0609 Other forms of dyspnea: Secondary | ICD-10-CM | POA: Diagnosis not present

## 2023-01-14 MED ORDER — ALBUTEROL SULFATE HFA 108 (90 BASE) MCG/ACT IN AERS: 2.0000 | INHALATION_SPRAY | Freq: Four times a day (QID) | RESPIRATORY_TRACT | 11 refills | Status: DC | PRN

## 2023-01-14 NOTE — Patient Instructions (Signed)
I have prescribed an albuterol inhaler to use before exercise AS A TRIAL   Pulmonary function tests and ECHO ordered

## 2023-01-14 NOTE — Progress Notes (Signed)
Subjective:  Patient ID: Cynthia Lewis, female    DOB: December 14, 1985  Age: 37 y.o. MRN: [id number]  CC: The encounter diagnosis was Dyspnea on exertion.   HPI Cynthia Lewis presents for  Chief Complaint  Patient presents with   Wheezing    Report wheeze with exertion, does report mild shob, denies chest pain; denies hx of asthma, has never used inhalers   Exertional wheezing:  noticed it 3-4 months ago.   OCCURS WITH EXERCISE AND short of breath even with walking.  Reports audible sounds c/w wheezing,  followed by dry cough. Has had a weight gain of 25 lbs since last visit despite exercising .    Hikes on Saturdays,  going to gym 3/week 15-30 minutes of cardio recently switching to rowing machine from the bike bc it was too easy, never got out of breath on the bike,   on the rowing machine started not  Morbid obesity: reviewed weights.  Personal best was 148 in 2015 when she was preparing for a competition .Marland Kitchen  She has no evidence of thyroid disease or diabetes by labs in November. Takes Adderall for ADD . Works full time and going to [employer].  Graduated last night with her master's degree in business analytics (working for [employer] , loves her job)  Engineer, materials at work, Haematologist   Fatigue:  averaging 5 hours of sleep  for the past month .  Admits to staying on phone on social media  too long.   Outpatient Medications Prior to Visit  Medication Sig Dispense Refill   ALPRAZolam (XANAX) 0.25 MG tablet Take 1 tablet (0.25 mg total) by mouth at bedtime as needed for anxiety. 30 tablet 0   amLODipine (NORVASC) 2.5 MG tablet TAKE 1 TABLET(2.5 MG) BY MOUTH DAILY 90 tablet 0   amphetamine-dextroamphetamine (ADDERALL) 10 MG tablet Take 1 tablet (10 mg total) by mouth daily as needed. In the afternoon prn 30 tablet 0   citalopram (CELEXA) 40 MG tablet Take 1 tablet (40 mg total) by mouth daily. 90 tablet 3   hydrOXYzine (ATARAX) 10 MG tablet Take 1-2 tablets (10-20 mg total) by mouth 3  (three) times daily as needed for anxiety (or sleep). 60 tablet 2   levonorgestrel (MIRENA) 20 MCG/24HR IUD 1 each by Intrauterine route once.     lisdexamfetamine (VYVANSE) 40 MG capsule Take 1 capsule (40 mg total) by mouth every morning. 30 capsule 0   [START ON 01/31/2023] lisdexamfetamine (VYVANSE) 40 MG capsule Take 1 capsule (40 mg total) by mouth every morning. 30 capsule 0   [START ON 03/01/2023] lisdexamfetamine (VYVANSE) 40 MG capsule Take 1 capsule (40 mg total) by mouth every morning. 30 capsule 0   metoprolol succinate (TOPROL-XL) 50 MG 24 hr tablet TAKE 1 TABLET(50 MG) BY MOUTH DAILY WITH OR IMMEDIATELY FOLLOWING A MEAL 90 tablet 0   Multiple Vitamin (MULTIVITAMIN ADULT PO) Take by mouth.     ondansetron (ZOFRAN-ODT) 4 MG disintegrating tablet DISSOLVE 1 TABLET ON THE TONGUE EVERY 8 HOURS AS NEEDED FOR NAUSEA OR VOMITING 20 tablet 0   No facility-administered medications prior to visit.    Review of Systems;  Patient denies headache, fevers, malaise, unintentional weight loss, skin rash, eye pain, sinus congestion and sinus pain, sore throat, dysphagia,  hemoptysis , cough, chest pain, palpitations, orthopnea, edema, abdominal pain, nausea, melena, diarrhea, constipation, flank pain, dysuria, hematuria, urinary  Frequency, nocturia, numbness, tingling, seizures,  Focal weakness, Loss of consciousness,  Tremor, insomnia, depression,  anxiety, and suicidal ideation.      Objective:  BP (!) 148/98   Pulse 72   Ht 5\' 6"  (1.676 m)   Wt 291 lb (132 kg)   SpO2 95%   BMI 46.97 kg/m   BP Readings from Last 3 Encounters:  01/14/23 (!) 148/98  07/06/22 123/88  06/26/22 134/84    Wt Readings from Last 3 Encounters:  01/14/23 291 lb (132 kg)  07/06/22 275 lb (124.7 kg)  06/26/22 278 lb 12.8 oz (126.5 kg)    Physical Exam Vitals reviewed.  Constitutional:      General: She is not in acute distress.    Appearance: Normal appearance. She is obese. She is not ill-appearing,  toxic-appearing or diaphoretic.  HENT:     Head: Normocephalic.  Eyes:     General: No scleral icterus.       Right eye: No discharge.        Left eye: No discharge.     Conjunctiva/sclera: Conjunctivae normal.  Cardiovascular:     Rate and Rhythm: Normal rate and regular rhythm.     Heart sounds: Normal heart sounds.  Pulmonary:     Effort: Pulmonary effort is normal. No respiratory distress.     Breath sounds: Normal breath sounds.  Musculoskeletal:        General: Normal range of motion.  Skin:    General: Skin is warm and dry.  Neurological:     General: No focal deficit present.     Mental Status: She is alert and oriented to person, place, and time. Mental status is at baseline.  Psychiatric:        Mood and Affect: Mood normal.        Behavior: Behavior normal.        Thought Content: Thought content normal.        Judgment: Judgment normal.     Lab Results  Component Value Date   HGBA1C 5.2 06/26/2022   HGBA1C 5.2 09/04/2020   HGBA1C 5.2 03/18/2018    Lab Results  Component Value Date   CREATININE 0.95 06/26/2022   CREATININE 0.93 01/11/2021   CREATININE 1.00 09/04/2020    Lab Results  Component Value Date   WBC 16.5 (H) 01/11/2021   HGB 14.6 01/11/2021   HCT 41.9 01/11/2021   PLT 301 01/11/2021   GLUCOSE 87 06/26/2022   CHOL 191 06/26/2022   TRIG 302 (H) 06/26/2022   HDL 47 06/26/2022   LDLCALC 94 06/26/2022   ALT 11 06/26/2022   AST 16 06/26/2022   NA 139 06/26/2022   K 4.2 06/26/2022   CL 102 06/26/2022   CREATININE 0.95 06/26/2022   BUN 12 06/26/2022   CO2 23 06/26/2022   TSH 2.530 06/26/2022   INR 1.0 01/11/2021   HGBA1C 5.2 06/26/2022    No results found.  Assessment & Plan:  .Dyspnea on exertion Assessment & Plan: Given her excessive weight gain need to rule out pulmonary hypertension, arrhythmia.  Referring  to cardiology ;  PFT and ECHO ordered   Orders: -     Albuterol Sulfate HFA; Inhale 2 puffs into the lungs every 6 (six)  hours as needed for wheezing or shortness of breath.  Dispense: 3.7 g; Refill: 11 -     Pulmonary Function Test; Future -     ECHOCARDIOGRAM COMPLETE; Future -     EKG 12-Lead     Follow-up: Return in about 3 months (around 04/16/2023).   Sherlene Shams, MD

## 2023-01-16 DIAGNOSIS — R0609 Other forms of dyspnea: Secondary | ICD-10-CM | POA: Insufficient documentation

## 2023-01-16 NOTE — Assessment & Plan Note (Addendum)
I have ordered and reviewed a 12 lead EKG and find that there are no acute changes and patient is in sinus rhythm.   Given her excessive weight gain need to rule out pulmonary hypertension, arrhythmia.  Referring  to cardiology ;  PFT and ECHO ordered

## 2023-01-19 ENCOUNTER — Ambulatory Visit: Payer: Managed Care, Other (non HMO) | Admitting: Internal Medicine

## 2023-01-25 ENCOUNTER — Other Ambulatory Visit: Payer: Self-pay

## 2023-01-25 MED ORDER — AMLODIPINE BESYLATE 2.5 MG PO TABS: ORAL_TABLET | ORAL | 1 refills | Status: DC

## 2023-02-05 ENCOUNTER — Telehealth: Payer: Self-pay

## 2023-02-05 DIAGNOSIS — R0609 Other forms of dyspnea: Secondary | ICD-10-CM

## 2023-02-05 NOTE — Telephone Encounter (Signed)
Need to change order when Dr. Darrick Huntsman returns on Monday.

## 2023-02-09 ENCOUNTER — Telehealth: Payer: Self-pay | Admitting: Internal Medicine

## 2023-02-09 NOTE — Telephone Encounter (Signed)
Lft pt vm to call ofc to sch PFT. thanks 

## 2023-02-09 NOTE — Telephone Encounter (Signed)
The pulmonary function order needs to be changed. The pulmonary function test needs to only include the 6 minute walk and the methacholine test needs to be ordered separate.

## 2023-02-09 NOTE — Telephone Encounter (Signed)
noted 

## 2023-02-11 NOTE — Telephone Encounter (Signed)
noted 

## 2023-02-17 ENCOUNTER — Ambulatory Visit: Payer: Managed Care, Other (non HMO) | Admitting: Internal Medicine

## 2023-02-24 ENCOUNTER — Telehealth: Payer: Self-pay | Admitting: Physician Assistant

## 2023-02-24 NOTE — Telephone Encounter (Signed)
Pt called at 1p requesting info to get her dog qualified as emotional support animal.  She said her apartment complex told her all she needed was a doctor's note.  Pls call her back to advise the process.  Next appt 11/11

## 2023-02-26 NOTE — Telephone Encounter (Signed)
5-YO English bulldog named Cynthia Lewis. Asks to mail letter. Told her it would be next week.

## 2023-03-01 ENCOUNTER — Ambulatory Visit: Payer: Managed Care, Other (non HMO)

## 2023-03-05 NOTE — Telephone Encounter (Signed)
Mailed letter today

## 2023-03-17 ENCOUNTER — Telehealth: Payer: Self-pay | Admitting: Internal Medicine

## 2023-03-17 ENCOUNTER — Encounter: Payer: Self-pay | Admitting: Internal Medicine

## 2023-03-17 ENCOUNTER — Other Ambulatory Visit: Payer: Self-pay | Admitting: Internal Medicine

## 2023-03-17 DIAGNOSIS — R0609 Other forms of dyspnea: Secondary | ICD-10-CM

## 2023-03-17 NOTE — Addendum Note (Signed)
Addended by: Sherlene Shams on: 03/17/2023 10:12 AM   Modules accepted: Orders

## 2023-03-17 NOTE — Telephone Encounter (Signed)
Lft pt vm to call to [facility].

## 2023-03-24 ENCOUNTER — Encounter (INDEPENDENT_AMBULATORY_CARE_PROVIDER_SITE_OTHER): Payer: Self-pay

## 2023-04-08 ENCOUNTER — Ambulatory Visit: Payer: Managed Care, Other (non HMO)

## 2023-04-12 ENCOUNTER — Other Ambulatory Visit: Payer: Self-pay | Admitting: Physician Assistant

## 2023-04-12 DIAGNOSIS — F411 Generalized anxiety disorder: Secondary | ICD-10-CM

## 2023-04-15 ENCOUNTER — Ambulatory Visit: Payer: Managed Care, Other (non HMO) | Admitting: Internal Medicine

## 2023-04-22 ENCOUNTER — Ambulatory Visit
Admission: RE | Admit: 2023-04-22 | Discharge: 2023-04-22 | Disposition: A | Discharge status: Home / Self Care | Payer: Managed Care, Other (non HMO) | Source: Ambulatory Visit | Attending: Internal Medicine | Admitting: Internal Medicine

## 2023-04-22 ENCOUNTER — Other Ambulatory Visit: Payer: Self-pay | Admitting: Respiratory Therapy

## 2023-04-22 ENCOUNTER — Ambulatory Visit: Payer: Managed Care, Other (non HMO)

## 2023-04-22 DIAGNOSIS — R0609 Other forms of dyspnea: Secondary | ICD-10-CM

## 2023-04-22 DIAGNOSIS — F419 Anxiety disorder, unspecified: Secondary | ICD-10-CM | POA: Diagnosis present

## 2023-04-22 DIAGNOSIS — I10 Essential (primary) hypertension: Secondary | ICD-10-CM | POA: Diagnosis present

## 2023-04-22 DIAGNOSIS — R9431 Abnormal electrocardiogram [ECG] [EKG]: Secondary | ICD-10-CM

## 2023-04-22 LAB — ECHOCARDIOGRAM COMPLETE
AR max vel: 3.34 cm2
AV Area VTI: 4.02 cm2
AV Area mean vel: 3.16 cm2
AV Mean grad: 4 mmHg
AV Peak grad: 5.3 mmHg
Ao pk vel: 1.15 m/s
Area-P 1/2: 5.02 cm2
MV VTI: 4.48 cm2
S' Lateral: 2.6 cm

## 2023-04-22 MED ORDER — ALBUTEROL SULFATE (2.5 MG/3ML) 0.083% IN NEBU
2.5000 mg | INHALATION_SOLUTION | Freq: Once | RESPIRATORY_TRACT | Status: AC
  Administered 2023-04-22: 2.5 mg via RESPIRATORY_TRACT
  Filled 2023-04-22: qty 3

## 2023-04-22 NOTE — Progress Notes (Signed)
*  PRELIMINARY RESULTS* Echocardiogram 2D Echocardiogram has been performed.  Cristela Blue 04/22/2023, 11:48 AM

## 2023-04-23 ENCOUNTER — Encounter: Payer: Self-pay | Admitting: Internal Medicine

## 2023-04-23 DIAGNOSIS — J45909 Unspecified asthma, uncomplicated: Secondary | ICD-10-CM | POA: Insufficient documentation

## 2023-04-23 LAB — PULMONARY FUNCTION TEST ARMC ONLY
DL/VA % pred: 114 %
DL/VA: 5.07 ml/min/mmHg/L
DLCO unc % pred: 118 %
DLCO unc: 28.02 ml/min/mmHg
FEF 25-75 Post: 1.96 L/s
FEF 25-75 Pre: 1.27 L/s
FEF2575-%Change-Post: 55 %
FEF2575-%Pred-Post: 58 %
FEF2575-%Pred-Pre: 37 %
FEV1-%Change-Post: 19 %
FEV1-%Pred-Post: 79 %
FEV1-%Pred-Pre: 66 %
FEV1-Post: 2.62 L
FEV1-Pre: 2.19 L
FEV1FVC-%Change-Post: 9 %
FEV1FVC-%Pred-Pre: 74 %
FEV6-%Change-Post: 9 %
FEV6-%Pred-Post: 97 %
FEV6-%Pred-Pre: 89 %
FEV6-Post: 3.82 L
FEV6-Pre: 3.5 L
FEV6FVC-%Change-Post: 0 %
FEV6FVC-%Pred-Post: 101 %
FEV6FVC-%Pred-Pre: 100 %
FVC-%Change-Post: 8 %
FVC-%Pred-Post: 96 %
FVC-%Pred-Pre: 88 %
FVC-Post: 3.83 L
FVC-Pre: 3.52 L
Post FEV1/FVC ratio: 68 %
Post FEV6/FVC ratio: 100 %
Pre FEV1/FVC ratio: 62 %
Pre FEV6/FVC Ratio: 99 %
RV % pred: 161 %
RV: 2.59 L
TLC % pred: 124 %
TLC: 6.64 L

## 2023-04-23 NOTE — Telephone Encounter (Signed)
Pt is in agreement, referral has been pended

## 2023-04-27 ENCOUNTER — Telehealth: Payer: Self-pay

## 2023-04-27 NOTE — Telephone Encounter (Signed)
Patient is returning a call from a Kim. Please call back and advise [phone]

## 2023-06-09 ENCOUNTER — Ambulatory Visit: Payer: Managed Care, Other (non HMO) | Admitting: Internal Medicine

## 2023-06-09 ENCOUNTER — Encounter: Payer: Self-pay | Admitting: Internal Medicine

## 2023-06-09 VITALS — BP 122/82 | HR 84 | Ht 66.0 in | Wt 291.4 lb

## 2023-06-09 DIAGNOSIS — E66811 Obesity, class 1: Secondary | ICD-10-CM | POA: Diagnosis not present

## 2023-06-09 DIAGNOSIS — R7301 Impaired fasting glucose: Secondary | ICD-10-CM | POA: Diagnosis not present

## 2023-06-09 DIAGNOSIS — Z2911 Encounter for prophylactic immunotherapy for respiratory syncytial virus (RSV): Secondary | ICD-10-CM | POA: Diagnosis not present

## 2023-06-09 DIAGNOSIS — I10 Essential (primary) hypertension: Secondary | ICD-10-CM | POA: Diagnosis not present

## 2023-06-09 DIAGNOSIS — Z6832 Body mass index (BMI) 32.0-32.9, adult: Secondary | ICD-10-CM

## 2023-06-09 DIAGNOSIS — Z23 Encounter for immunization: Secondary | ICD-10-CM | POA: Diagnosis not present

## 2023-06-09 DIAGNOSIS — E6609 Other obesity due to excess calories: Secondary | ICD-10-CM

## 2023-06-09 DIAGNOSIS — J4530 Mild persistent asthma, uncomplicated: Secondary | ICD-10-CM

## 2023-06-09 MED ORDER — BUDESONIDE-FORMOTEROL FUMARATE 80-4.5 MCG/ACT IN AERO: 2.0000 | INHALATION_SPRAY | Freq: Two times a day (BID) | RESPIRATORY_TRACT | 3 refills | Status: DC

## 2023-06-09 MED ORDER — HYDROCHLOROTHIAZIDE 25 MG PO TABS: 25.0000 mg | ORAL_TABLET | Freq: Every day | ORAL | 3 refills | Status: DC

## 2023-06-09 NOTE — Progress Notes (Signed)
Subjective:  Patient ID: Cynthia Lewis, female    DOB: Jan 08, 1986  Age: 37 y.o. MRN: [id number]  CC: The primary encounter diagnosis was Essential hypertension. Diagnoses of Impaired fasting glucose, Class 1 obesity due to excess calories without serious comorbidity with body mass index (BMI) of 32.0 to 32.9 in adult, Need for RSV vaccination, Need for influenza vaccination, and Mild persistent asthma, unspecified whether complicated were also pertinent to this visit.   HPI Cynthia Lewis presents for  Chief Complaint  Patient presents with   Medical Management of Chronic Issues    3 month follow up     1) Asthma:  diagnosed by PFTS August 2024.    Has appt in mid November with pulmonology.   Using albuterol pre exercise and notices a difference.    Wheezing during walks outside with the dog.  Has not been prescribed maintenance inhaler.     Outpatient Medications Prior to Visit  Medication Sig Dispense Refill   albuterol (VENTOLIN HFA) 108 (90 Base) MCG/ACT inhaler Inhale 2 puffs into the lungs every 6 (six) hours as needed for wheezing or shortness of breath. 3.7 g 11   ALPRAZolam (XANAX) 0.25 MG tablet TAKE 1 TABLET(0.25 MG) BY MOUTH AT BEDTIME AS NEEDED FOR ANXIETY 30 tablet 2   amLODipine (NORVASC) 2.5 MG tablet TAKE 1 TABLET(2.5 MG) BY MOUTH DAILY 90 tablet 1   citalopram (CELEXA) 40 MG tablet Take 1 tablet (40 mg total) by mouth daily. 90 tablet 3   hydrOXYzine (ATARAX) 10 MG tablet Take 1-2 tablets (10-20 mg total) by mouth 3 (three) times daily as needed for anxiety (or sleep). 60 tablet 2   levonorgestrel (MIRENA) 20 MCG/24HR IUD 1 each by Intrauterine route once.     lisdexamfetamine (VYVANSE) 40 MG capsule Take 1 capsule (40 mg total) by mouth every morning. 30 capsule 0   lisdexamfetamine (VYVANSE) 40 MG capsule Take 1 capsule (40 mg total) by mouth every morning. 30 capsule 0   lisdexamfetamine (VYVANSE) 40 MG capsule Take 1 capsule (40 mg total) by mouth every  morning. 30 capsule 0   metoprolol succinate (TOPROL-XL) 50 MG 24 hr tablet TAKE 1 TABLET(50 MG) BY MOUTH DAILY WITH OR IMMEDIATELY FOLLOWING A MEAL 90 tablet 0   Multiple Vitamin (MULTIVITAMIN ADULT PO) Take by mouth.     ondansetron (ZOFRAN-ODT) 4 MG disintegrating tablet DISSOLVE 1 TABLET ON THE TONGUE EVERY 8 HOURS AS NEEDED FOR NAUSEA OR VOMITING 20 tablet 0   amphetamine-dextroamphetamine (ADDERALL) 10 MG tablet Take 1 tablet (10 mg total) by mouth daily as needed. In the afternoon prn (Patient not taking: Reported on 06/09/2023) 30 tablet 0   No facility-administered medications prior to visit.    Review of Systems;  Patient denies headache, fevers, malaise, unintentional weight loss, skin rash, eye pain, sinus congestion and sinus pain, sore throat, dysphagia,  hemoptysis , cough, dyspnea, wheezing, chest pain, palpitations, orthopnea, edema, abdominal pain, nausea, melena, diarrhea, constipation, flank pain, dysuria, hematuria, urinary  Frequency, nocturia, numbness, tingling, seizures,  Focal weakness, Loss of consciousness,  Tremor, insomnia, depression, anxiety, and suicidal ideation.      Objective:  BP 122/82   Pulse 84   Ht 5\' 6"  (1.676 m)   Wt 291 lb 6.4 oz (132.2 kg)   SpO2 98%   BMI 47.03 kg/m   BP Readings from Last 3 Encounters:  06/09/23 122/82  01/14/23 (!) 148/98  07/06/22 123/88    Wt Readings from Last 3 Encounters:  06/09/23  291 lb 6.4 oz (132.2 kg)  01/14/23 291 lb (132 kg)  07/06/22 275 lb (124.7 kg)    Physical Exam Vitals reviewed.  Constitutional:      General: She is not in acute distress.    Appearance: Normal appearance. She is obese. She is not ill-appearing, toxic-appearing or diaphoretic.  HENT:     Head: Normocephalic.  Eyes:     General: No scleral icterus.       Right eye: No discharge.        Left eye: No discharge.     Conjunctiva/sclera: Conjunctivae normal.  Cardiovascular:     Rate and Rhythm: Normal rate and regular  rhythm.     Heart sounds: Normal heart sounds.  Pulmonary:     Effort: Pulmonary effort is normal. No respiratory distress.     Breath sounds: Normal breath sounds.  Musculoskeletal:        General: Normal range of motion.  Skin:    General: Skin is warm and dry.  Neurological:     General: No focal deficit present.     Mental Status: She is alert and oriented to person, place, and time. Mental status is at baseline.  Psychiatric:        Mood and Affect: Mood normal.        Behavior: Behavior normal.        Thought Content: Thought content normal.        Judgment: Judgment normal.     Lab Results  Component Value Date   HGBA1C 5.2 06/26/2022   HGBA1C 5.2 09/04/2020   HGBA1C 5.2 03/18/2018    Lab Results  Component Value Date   CREATININE 0.95 06/26/2022   CREATININE 0.93 01/11/2021   CREATININE 1.00 09/04/2020    Lab Results  Component Value Date   WBC 16.5 (H) 01/11/2021   HGB 14.6 01/11/2021   HCT 41.9 01/11/2021   PLT 301 01/11/2021   GLUCOSE 87 06/26/2022   CHOL 191 06/26/2022   TRIG 302 (H) 06/26/2022   HDL 47 06/26/2022   LDLCALC 94 06/26/2022   ALT 11 06/26/2022   AST 16 06/26/2022   NA 139 06/26/2022   K 4.2 06/26/2022   CL 102 06/26/2022   CREATININE 0.95 06/26/2022   BUN 12 06/26/2022   CO2 23 06/26/2022   TSH 2.530 06/26/2022   INR 1.0 01/11/2021   HGBA1C 5.2 06/26/2022    ECHOCARDIOGRAM COMPLETE  Result Date: 04/22/2023    ECHOCARDIOGRAM REPORT   Patient Name:   Cynthia Lewis Date of Exam: 04/22/2023 Medical Rec #:  [id number]         Height:       66.0 in Accession #:    [id number]        Weight:       291.0 lb Date of Birth:  [birth year]          BSA:          2.345 m Patient Age:    37 years          BP:           148/98 mmHg Patient Gender: F                 HR:           72 bpm. Exam Location:  [facility] Procedure: 2D Echo, Cardiac Doppler and Color Doppler Indications:     Dyspnea on exertion R06.09  Abnormal ECG R94.31   History:         Patient has no prior history of Echocardiogram examinations.                  Risk Factors:Hypertension. Anxiety.  Sonographer:     Cristela Blue Referring Phys:  [id number] Sherlene Shams Diagnosing Phys: Julien Nordmann MD  Sonographer Comments: Suboptimal parasternal window. IMPRESSIONS  1. Left ventricular ejection fraction, by estimation, is 60 to 65%. The left ventricle has normal function. The left ventricle has no regional wall motion abnormalities. Left ventricular diastolic parameters were normal.  2. Right ventricular systolic function is normal. The right ventricular size is normal. There is normal pulmonary artery systolic pressure. The estimated right ventricular systolic pressure is 30.0 mmHg.  3. The mitral valve is normal in structure. No evidence of mitral valve regurgitation. No evidence of mitral stenosis.  4. The aortic valve is normal in structure. Aortic valve regurgitation is not visualized. No aortic stenosis is present.  5. The inferior vena cava is normal in size with greater than 50% respiratory variability, suggesting right atrial pressure of 3 mmHg. FINDINGS  Left Ventricle: Left ventricular ejection fraction, by estimation, is 60 to 65%. The left ventricle has normal function. The left ventricle has no regional wall motion abnormalities. The left ventricular internal cavity size was normal in size. There is  no left ventricular hypertrophy. Left ventricular diastolic parameters were normal. Right Ventricle: The right ventricular size is normal. No increase in right ventricular wall thickness. Right ventricular systolic function is normal. There is normal pulmonary artery systolic pressure. The tricuspid regurgitant velocity is 2.50 m/s, and  with an assumed right atrial pressure of 5 mmHg, the estimated right ventricular systolic pressure is 30.0 mmHg. Left Atrium: Left atrial size was normal in size. Right Atrium: Right atrial size was normal in size. Pericardium: There is no  evidence of pericardial effusion. Mitral Valve: The mitral valve is normal in structure. No evidence of mitral valve regurgitation. No evidence of mitral valve stenosis. MV peak gradient, 3.0 mmHg. The mean mitral valve gradient is 2.0 mmHg. Tricuspid Valve: The tricuspid valve is normal in structure. Tricuspid valve regurgitation is mild . No evidence of tricuspid stenosis. Aortic Valve: The aortic valve is normal in structure. Aortic valve regurgitation is not visualized. No aortic stenosis is present. Aortic valve mean gradient measures 4.0 mmHg. Aortic valve peak gradient measures 5.3 mmHg. Aortic valve area, by VTI measures 4.02 cm. Pulmonic Valve: The pulmonic valve was normal in structure. Pulmonic valve regurgitation is not visualized. No evidence of pulmonic stenosis. Aorta: The aortic root is normal in size and structure. Venous: The inferior vena cava is normal in size with greater than 50% respiratory variability, suggesting right atrial pressure of 3 mmHg. IAS/Shunts: No atrial level shunt detected by color flow Doppler.  LEFT VENTRICLE PLAX 2D LVIDd:         3.60 cm   Diastology LVIDs:         2.60 cm   LV e' medial:    16.00 cm/s LV PW:         1.10 cm   LV E/e' medial:  4.4 LV IVS:        1.10 cm   LV e' lateral:   23.20 cm/s LVOT diam:     2.20 cm   LV E/e' lateral: 3.0 LV SV:         85 LV SV Index:   36 LVOT Area:  3.80 cm  RIGHT VENTRICLE RV Basal diam:  2.80 cm RV Mid diam:    2.70 cm LEFT ATRIUM           Index        RIGHT ATRIUM           Index LA diam:      2.10 cm 0.90 cm/m   RA Area:     11.50 cm LA Vol (A2C): 26.0 ml 11.09 ml/m  RA Volume:   23.40 ml  9.98 ml/m LA Vol (A4C): 16.4 ml 6.99 ml/m  AORTIC VALVE AV Area (Vmax):    3.34 cm AV Area (Vmean):   3.16 cm AV Area (VTI):     4.02 cm AV Vmax:           115.00 cm/s AV Vmean:          89.300 cm/s AV VTI:            0.212 m AV Peak Grad:      5.3 mmHg AV Mean Grad:      4.0 mmHg LVOT Vmax:         101.00 cm/s LVOT Vmean:         74.300 cm/s LVOT VTI:          0.224 m LVOT/AV VTI ratio: 1.06  AORTA Ao Root diam: 3.30 cm MITRAL VALVE               TRICUSPID VALVE MV Area (PHT): 5.02 cm    TR Peak grad:   25.0 mmHg MV Area VTI:   4.48 cm    TR Vmax:        250.00 cm/s MV Peak grad:  3.0 mmHg MV Mean grad:  2.0 mmHg    SHUNTS MV Vmax:       0.86 m/s    Systemic VTI:  0.22 m MV Vmean:      63.0 cm/s   Systemic Diam: 2.20 cm MV Decel Time: 151 msec MV E velocity: 69.70 cm/s MV A velocity: 89.10 cm/s MV E/A ratio:  0.78 Julien Nordmann MD Electronically signed by Julien Nordmann MD Signature Date/Time: 04/22/2023/1:39:32 PM    Final     Assessment & Plan:  .Essential hypertension -     Comprehensive metabolic panel -     Microalbumin / creatinine urine ratio  Impaired fasting glucose -     Comprehensive metabolic panel -     Hemoglobin A1c  Class 1 obesity due to excess calories without serious comorbidity with body mass index (BMI) of 32.0 to 32.9 in adult -     LDL cholesterol, direct -     TSH -     CBC with Differential/Platelet -     Lipid panel  Need for RSV vaccination -     RSV,Recombinant PF (Arexvy)  Need for influenza vaccination -     Flu vaccine trivalent PF, 6mos and older(Flulaval,Afluria,Fluarix,Fluzone)  Mild persistent asthma, unspecified whether complicated  Other orders -     Budesonide-Formoterol Fumarate; Inhale 2 puffs into the lungs 2 (two) times daily.  Dispense: 1 each; Refill: 3 -     hydroCHLOROthiazide; Take 1 tablet (25 mg total) by mouth daily.  Dispense: 90 tablet; Refill: 3     Follow-up: Return in about 6 months (around 12/08/2023).   Sherlene Shams, MD

## 2023-06-09 NOTE — Patient Instructions (Addendum)
Am starting you on Symbicort to use twice daily as a "maintenance inhaler" :  2 puffs every 12 hours.  This should reduce your need for albuterol.   If you have eosinophilia on your labs today,  we may add another asthma medication    I want to wean you off of metoprolol with the 4 tablets you have left :Reduce metoprolol to 1/2 tablet for 4 days,  then 1/2 every other  day until gone  Continue  amlodipine at 2.5 mg daily (higher doses will cause more fluid retention  Adding  hydrochlorothiazide  if BP rises to 130/80 or higher

## 2023-06-10 LAB — COMPREHENSIVE METABOLIC PANEL
ALT: 15 [IU]/L (ref 0–32)
AST: 17 [IU]/L (ref 0–40)
Albumin: 4.2 g/dL (ref 3.9–4.9)
Alkaline Phosphatase: 90 [IU]/L (ref 44–121)
BUN/Creatinine Ratio: 8 — ABNORMAL LOW (ref 9–23)
BUN: 8 mg/dL (ref 6–20)
Bilirubin Total: 0.4 mg/dL (ref 0.0–1.2)
CO2: 22 mmol/L (ref 20–29)
Calcium: 9.3 mg/dL (ref 8.7–10.2)
Chloride: 103 mmol/L (ref 96–106)
Creatinine, Ser: 0.96 mg/dL (ref 0.57–1.00)
Globulin, Total: 2.8 g/dL (ref 1.5–4.5)
Glucose: 97 mg/dL (ref 70–99)
Potassium: 4.4 mmol/L (ref 3.5–5.2)
Sodium: 141 mmol/L (ref 134–144)
Total Protein: 7 g/dL (ref 6.0–8.5)
eGFR: 79 mL/min/{1.73_m2} (ref >59)

## 2023-06-10 LAB — CBC WITH DIFFERENTIAL/PLATELET
Basophils Absolute: 0 10*3/uL (ref 0.0–0.2)
Basos: 0 %
EOS (ABSOLUTE): 0.2 10*3/uL (ref 0.0–0.4)
Eos: 2 %
Hematocrit: 44.5 % (ref 34.0–46.6)
Hemoglobin: 14.7 g/dL (ref 11.1–15.9)
Immature Grans (Abs): 0 10*3/uL (ref 0.0–0.1)
Immature Granulocytes: 0 %
Lymphocytes Absolute: 2.9 10*3/uL (ref 0.7–3.1)
Lymphs: 26 %
MCH: 30.9 pg (ref 26.6–33.0)
MCHC: 33 g/dL (ref 31.5–35.7)
MCV: 94 fL (ref 79–97)
Monocytes Absolute: 0.5 10*3/uL (ref 0.1–0.9)
Monocytes: 5 %
Neutrophils Absolute: 7.3 10*3/uL — ABNORMAL HIGH (ref 1.4–7.0)
Neutrophils: 67 %
Platelets: 336 10*3/uL (ref 150–450)
RBC: 4.76 x10E6/uL (ref 3.77–5.28)
RDW: 12.3 % (ref 11.7–15.4)
WBC: 10.9 10*3/uL — ABNORMAL HIGH (ref 3.4–10.8)

## 2023-06-10 LAB — LIPID PANEL
Chol/HDL Ratio: 3.8 {ratio} (ref 0.0–4.4)
Cholesterol, Total: 180 mg/dL (ref 100–199)
HDL: 47 mg/dL (ref >39)
LDL Chol Calc (NIH): 96 mg/dL (ref 0–99)
Triglycerides: 220 mg/dL — ABNORMAL HIGH (ref 0–149)
VLDL Cholesterol Cal: 37 mg/dL (ref 5–40)

## 2023-06-10 LAB — LDL CHOLESTEROL, DIRECT: LDL Direct: 95 mg/dL (ref 0–99)

## 2023-06-10 LAB — MICROALBUMIN / CREATININE URINE RATIO
Creatinine, Urine: 253.8 mg/dL
Microalb/Creat Ratio: 3 mg/g{creat} (ref 0–29)
Microalbumin, Urine: 7.3 ug/mL

## 2023-06-10 LAB — TSH: TSH: 2.65 u[IU]/mL (ref 0.450–4.500)

## 2023-06-10 LAB — HEMOGLOBIN A1C
Est. average glucose Bld gHb Est-mCnc: 111 mg/dL
Hgb A1c MFr Bld: 5.5 % (ref 4.8–5.6)

## 2023-06-11 ENCOUNTER — Institutional Professional Consult (permissible substitution): Payer: Managed Care, Other (non HMO) | Admitting: Internal Medicine

## 2023-06-30 ENCOUNTER — Ambulatory Visit: Payer: Managed Care, Other (non HMO) | Admitting: Internal Medicine

## 2023-06-30 ENCOUNTER — Encounter: Payer: Self-pay | Admitting: Internal Medicine

## 2023-06-30 VITALS — BP 138/78 | HR 69 | Ht 66.0 in | Wt 296.4 lb

## 2023-06-30 DIAGNOSIS — G4733 Obstructive sleep apnea (adult) (pediatric): Secondary | ICD-10-CM

## 2023-06-30 DIAGNOSIS — J4540 Moderate persistent asthma, uncomplicated: Secondary | ICD-10-CM

## 2023-06-30 NOTE — Patient Instructions (Addendum)
It was a pleasure to see you today!  Please schedule follow up scheduled with myself in 2 months with a virtual visit.  If my schedule is not open yet, we will contact you with a reminder closer to that time. Please call [phone] if you haven't heard from [place] a month before, and always call us sooner if issues or concerns arise. You can also send [place] a message through MyChart, but but aware that this is not to be used for urgent issues and it may take up to 5-7 days to receive a reply. Please be aware that you will likely be able to view your results before I have a chance to respond to them. Please give [place] 5 business days to respond to any non-urgent results.   Before your next visit I would like you to have: Home sleep study - once you have completed the study please call our office to set up a virtual visit to go over the results.   Continue the symbicort 80 mcg 2 puffs twice daily, gargle after use.  Take the albuterol rescue inhaler every 4 to 6 hours as needed for wheezing or shortness of breath. You can also take it 15 minutes before exercise or exertional activity. Side effects include heart racing or pounding, jitters or anxiety. If you have a history of an irregular heart rhythm, it can make this worse. Can also give some patients a hard time sleeping.  By learning about asthma and how it can be controlled, you take an important step toward managing this disease. Work closely with your asthma care team to learn all you can about your asthma, how to avoid triggers, what your medications do, and how to take them correctly. With proper care, you can live free of asthma symptoms and maintain a normal, healthy lifestyle.   What is asthma? Asthma is a chronic disease that affects the airways of the lungs. During normal breathing, the bands of muscle that surround the airways are relaxed and air moves freely. During an asthma episode or "attack," there are three main changes that stop air from  moving easily through the airways: The bands of muscle that surround the airways tighten and make the airways narrow. This tightening is called bronchospasm.  The lining of the airways becomes swollen or inflamed.  The cells that line the airways produce more mucus, which is thicker than normal and clogs the airways.  These three factors - bronchospasm, inflammation, and mucus production - cause symptoms such as difficulty breathing, wheezing, and coughing.  What are the most common symptoms of asthma? Asthma symptoms are not the same for everyone. They can even change from episode to episode in the same person. Also, you may have only one symptom of asthma, such as cough, but another person may have all the symptoms of asthma. It is important to know all the symptoms of asthma and to be aware that your asthma can present in any of these ways at any time. The most common symptoms include: Coughing, especially at night  Shortness of breath  Wheezing  Chest tightness, pain, or pressure   Who is affected by asthma? Asthma affects 22 million Americans; about 6 million of these are children under age 32. People who have a family history of asthma have an increased risk of developing the disease. Asthma is also more common in people who have allergies or who are exposed to tobacco smoke. However, anyone can develop asthma at any time. Some  people may have asthma all of their lives, while others may develop it as adults.  What causes asthma? The airways in a person with asthma are very sensitive and react to many things, or "triggers." Contact with these triggers causes asthma symptoms. One of the most important parts of asthma control is to identify your triggers and then avoid them when possible. The only trigger you do not want to avoid is exercise. Pre-treatment with medicines before exercise can allow you to stay active yet avoid asthma symptoms. Common asthma triggers include: Infections (colds,  viruses, flu, sinus infections)  Exercise  Weather (changes in temperature and/or humidity, cold air)  Tobacco smoke  Allergens (dust mites, pollens, pets, mold spores, cockroaches, and sometimes foods)  Irritants (strong odors from cleaning products, perfume, wood smoke, air pollution)  Strong emotions such as crying or laughing hard  Some medications   How is asthma diagnosed? To diagnose asthma, your doctor will first review your medical history, family history, and symptoms. Your doctor will want to know any past history of breathing problems you may have had, as well as a family history of asthma, allergies, eczema (a bumpy, itchy skin rash caused by allergies), or other lung disease. It is important that you describe your symptoms in detail (cough, wheeze, shortness of breath, chest tightness), including when and how often they occur. The doctor will perform a physical examination and listen to your heart and lungs. He or she may also order breathing tests, allergy tests, blood tests, and chest and sinus X-rays. The tests will find out if you do have asthma and if there are any other conditions that are contributing factors.  How is asthma treated? Asthma can be controlled, but not cured. It is not normal to have frequent symptoms, trouble sleeping, or trouble completing tasks. Appropriate asthma care will prevent symptoms and visits to the emergency room and hospital. Asthma medicines are one of the mainstays of asthma treatment. The drugs used to treat asthma are explained below.  Anti-inflammatories: These are the most important drugs for most people with asthma. Anti-inflammatory drugs reduce swelling and mucus production in the airways. As a result, airways are less sensitive and less likely to react to triggers. These medications need to be taken daily and may need to be taken for several weeks before they begin to control asthma. Anti-inflammatory medicines lead to fewer symptoms, better  airflow, less sensitive airways, less airway damage, and fewer asthma attacks. If taken every day, they CONTROL or prevent asthma symptoms.   Bronchodilators: These drugs relax the muscle bands that tighten around the airways. This action opens the airways, letting more air in and out of the lungs and improving breathing. Bronchodilators also help clear mucus from the lungs. As the airways open, the mucus moves more freely and can be coughed out more easily. In short-acting forms, bronchodilators RELIEVE or stop asthma symptoms by quickly opening the airways and are very helpful during an asthma episode. In long-acting forms, bronchodilators provide CONTROL of asthma symptoms and prevent asthma episodes.  Asthma drugs can be taken in a variety of ways. Inhaling the medications by using a metered dose inhaler, dry powder inhaler, or nebulizer is one way of taking asthma medicines. Oral medicines (pills or liquids you swallow) may also be prescribed.  Asthma severity Asthma is classified as either "intermittent" (comes and goes) or "persistent" (lasting). Persistent asthma is further described as being mild, moderate, or severe. The severity of asthma is based on how often  you have symptoms both during the day and night, as well as by the results of lung function tests and by how well you can perform activities. The "severity" of asthma refers to how "intense" or "strong" your asthma is.  Asthma control Asthma control is the goal of asthma treatment. Regardless of your asthma severity, it may or may not be controlled. Asthma control means: You are able to do everything you want to do at work and home  You have no (or minimal) asthma symptoms  You do not wake up from your sleep or earlier than usual in the morning due to asthma  You rarely need to use your reliever medicine (inhaler)  Another major part of your treatment is that you are happy with your asthma care and believe your asthma is  controlled.  Monitoring symptoms A key part of treatment is keeping track of how well your lungs are working. Monitoring your symptoms, what they are, how and when they happen, and how severe they are, is an important part of being able to control your asthma.  Sometimes asthma is monitored using a peak flow meter. A peak flow (PF) meter measures how fast the air comes out of your lungs. It can help you know when your asthma is getting worse, sometimes even before you have symptoms. By taking daily peak flow readings, you can learn when to adjust medications to keep asthma under good control. It is also used to create your asthma action plan (see below). Your doctor can use your peak flow readings to adjust your treatment plan in some cases.  Asthma Action Plan Based on your history and asthma severity, you and your doctor will develop a care plan called an "asthma action plan." The asthma action plan describes when and how to use your medicines, actions to take when asthma worsens, and when to seek emergency care. Make sure you understand this plan. If you do not, ask your asthma care provider any questions you may have. Your asthma action plan is one of the keys to controlling asthma. Keep it readily available to remind you of what you need to do every day to control asthma and what you need to do when symptoms occur.  Goals of asthma therapy These are the goals of asthma treatment: Live an active, normal life  Prevent chronic and troublesome symptoms  Attend work or school every day  Perform daily activities without difficulty  Stop urgent visits to the doctor, emergency department, or hospital  Use and adjust medications to control asthma with few or no side effects

## 2023-06-30 NOTE — Progress Notes (Signed)
Cynthia Lewis    [id number]    [birth year]  Primary Care Physician:Tullo, Mar Daring, MD  Referring Physician: Sherlene Shams, MD [address] [place],  [place] [address] Reason for Consultation: asthma Date of Consultation: 06/30/2023  Chief complaint:   Chief Complaint  Patient presents with   Consult    Pt diagnosed with asthma in August. Pt states after starting symbicort she has been doing well      HPI: Cynthia Lewis is a 37 y.o. woman who presents for new patient evaluation for asthma.  Started having wheezing with exertion a few months ago when hiking with her family. She also started having dyspnea with walking.   She does have a history of dyspnea with exertion in childhood and inability to keep up with peers. She assumed it was due to being "out of shape."  She also sometimes had wheezing when laying down.   She had pfts done by pcp in August 2024 confirmed asthma. She was started on symbicort 80 2 puffs twice daily. Has not needed albuterol at all.   She also endorses poor sleep, headaches, daytime sleepiness and snoring.   Current Regimen: symbicort 80 2 puffs twice daily Asthma Triggers: exertion, cold weather, humidty/heat  Exacerbations in the last year: none History of hospitalization or intubation: never Allergy Testing/rhinitis: denies GERD: denies ACT:  Asthma Control Test ACT Total Score  06/30/2023 11:07 AM 20   FeNO: Serum Eos/IgE: 100 in May 2022   Social history:  Occupation: she graduated with masters in Hydrologist. She is currently working at [employer] as a Engineer, civil (consulting).  Exposures: born [place].  Smoking history: passive smoke exposure in childhood. She is a never smoker. Lives at home with dog.   Social History   Occupational History   Not on file  Tobacco Use   Smoking status: Never   Smokeless tobacco: Never  Vaping Use   Vaping status: Never Used  Substance and Sexual Activity    Alcohol use: Yes    Alcohol/week: 1.0 standard drink of alcohol    Types: 1 Standard drinks or equivalent per week   Drug use: No   Sexual activity: Yes    Birth control/protection: I.U.D., None    Relevant family history:  Family History  Problem Relation Age of Onset   Hypertension Mother    Anxiety disorder Mother    Ovarian cancer Mother    ADD / ADHD Father    Depression Father    Anxiety disorder Father    Diabetes Father    Hypertension Father    Hyperlipidemia Father    Healthy Brother    Diabetes Maternal Grandfather    Heart failure Maternal Grandfather    Heart failure Maternal Grandmother    Anxiety disorder Maternal Grandmother    Diabetes Paternal Grandfather    Cancer Paternal Grandfather    Cancer Paternal Grandmother 62       breast ca   Diabetes Paternal Grandmother     Past Medical History:  Diagnosis Date   ADHD    Anxiety    Depression    Hx of dysplastic nevus 2012   Left medial buttock    Hypertension    Hypertension     Past Surgical History:  Procedure Laterality Date   ORIF ANKLE FRACTURE Left 01/12/2021   Procedure: OPEN REDUCTION INTERNAL FIXATION (ORIF) ANKLE FRACTURE;  Surgeon: Juanell Fairly, MD;  Location: [facility] ORS;  Service: Orthopedics;  Laterality: Left;     Physical Exam: Blood pressure 138/78, pulse 69, height 5\' 6"  (1.676 m), weight 296 lb 6.4 oz (134.4 kg), SpO2 96%. Gen:      No acute distress ENT:  no nasal polyps, mucus membranes moist, mallampati IV Lungs:    No increased respiratory effort, symmetric chest wall excursion, clear to auscultation bilaterally, no wheezes or crackles CV:         Regular rate and rhythm; no murmurs, rubs, or gallops.  No pedal edema Abd:      + bowel sounds; soft, non-tender; no distension MSK: no acute synovitis of DIP or PIP joints, no mechanics hands.  Skin:      Warm and dry; no rashes Neuro: normal speech, no focal facial asymmetry Psych: alert and oriented x3, normal mood and  affect   Data Reviewed/Medical Decision Making:  Independent interpretation of tests: Imaging:  Review of patient's chest xray images 2018 revealed no acute process, tiny calcified granulomas. The patient's images have been independently reviewed by me.    PFTs: I have personally reviewed the patient's PFTs and they show moderate airflow limitation with +BD response consistnet with asthma. Also has hyperinlfation and air trapping and increased dlco.     Latest Ref Rng & Units 04/22/2023   12:42 PM  PFT Results  FVC-Pre L 3.52   FVC-Predicted Pre % 88   FVC-Post L 3.83   FVC-Predicted Post % 96   Pre FEV1/FVC % % 62   Post FEV1/FCV % % 68   FEV1-Pre L 2.19   FEV1-Predicted Pre % 66   FEV1-Post L 2.62   DLCO uncorrected ml/min/mmHg 28.02   DLCO UNC% % 118   DLVA Predicted % 114   TLC L 6.64   TLC % Predicted % 124   RV % Predicted % 161     Labs:  Lab Results  Component Value Date   WBC 10.9 (H) 06/09/2023   HGB 14.7 06/09/2023   HCT 44.5 06/09/2023   MCV 94 06/09/2023   PLT 336 06/09/2023   Lab Results  Component Value Date   NA 141 06/09/2023   K 4.4 06/09/2023   CL 103 06/09/2023   CO2 22 06/09/2023      Immunization status:  Immunization History  Administered Date(s) Administered   Influenza Split 11/03/2012   Influenza, Seasonal, Injecte, Preservative Fre 06/09/2023   Influenza,inj,Quad PF,6+ Mos 05/20/2018, 05/10/2019, 06/26/2022   Influenza-Unspecified 06/25/2014, 07/04/2020, 05/21/2021   PFIZER(Purple Top)SARS-COV-2 Vaccination 10/29/2019, 11/20/2019, 08/23/2020, 03/17/2021   Respiratory Syncytial Virus Vaccine,Recomb Aduvanted(Arexvy) 06/09/2023   Tdap 11/03/2012     I reviewed prior external note(s) from primary care  I reviewed the result(s) of the labs and imaging as noted above.   I have ordered home sleep study   Assessment:  Moderate persistent asthma.  High concern for OSA  Plan/Recommendations: Home sleep study - once you have  completed the study please call our office to set up a virtual visit to go over the results.   Continue the symbicort 80 mcg 2 puffs twice daily, gargle after use.   Take the albuterol rescue inhaler every 4 to 6 hours as needed for wheezing or shortness of breath.  We discussed disease management and progression at length today.    Return to Care: Return in about 2 months (around 08/30/2023). Virtual visit follow up to review sleep study.   Durel Salts, MD Pulmonary and Critical Care Medicine [facility] Office:[phone]  CC: Darrick Huntsman,  Mar Daring, MD

## 2023-07-05 ENCOUNTER — Encounter: Payer: Self-pay | Admitting: Physician Assistant

## 2023-07-05 ENCOUNTER — Ambulatory Visit: Payer: 59 | Admitting: Physician Assistant

## 2023-07-05 DIAGNOSIS — F411 Generalized anxiety disorder: Secondary | ICD-10-CM

## 2023-07-05 DIAGNOSIS — F338 Other recurrent depressive disorders: Secondary | ICD-10-CM | POA: Diagnosis not present

## 2023-07-05 DIAGNOSIS — F902 Attention-deficit hyperactivity disorder, combined type: Secondary | ICD-10-CM | POA: Diagnosis not present

## 2023-07-05 MED ORDER — LISDEXAMFETAMINE DIMESYLATE 40 MG PO CAPS: 40.0000 mg | ORAL_CAPSULE | ORAL | 0 refills | Status: DC

## 2023-07-05 MED ORDER — CITALOPRAM HYDROBROMIDE 40 MG PO TABS: 40.0000 mg | ORAL_TABLET | Freq: Every day | ORAL | 3 refills | Status: DC

## 2023-07-05 MED ORDER — BUPROPION HCL ER (XL) 150 MG PO TB24
150.0000 mg | ORAL_TABLET | Freq: Every morning | ORAL | 1 refills | Status: DC
Start: 2023-07-05 — End: 2023-08-23

## 2023-07-05 MED ORDER — ALPRAZOLAM 0.25 MG PO TABS
0.2500 mg | ORAL_TABLET | Freq: Every day | ORAL | 5 refills | Status: DC | PRN
Start: 2023-07-05 — End: 2024-02-18

## 2023-07-05 NOTE — Progress Notes (Signed)
[facility] Check  Patient ID: Cynthia Lewis,  MRN: [id number]  PCP: Sherlene Shams, MD  Date of Evaluation: 07/05/2023 Time spent:25 minutes  Chief Complaint:  Chief Complaint   Anxiety; Depression; ADHD    HISTORY/CURRENT STATUS: HPI  For routine med check.  Has SAD, never officially dx. is already starting to feel "down" but every year in January and February it is a lot worse.  She ask if there is any changes we could make now that will help to treat and prevent this from getting worse.  She is able to enjoy things.  Energy and motivation are good.  ADLs and personal hygiene are normal.  She does not cry easily.  Appetite is normal and weight is stable.  No feelings of hopelessness.  Sleeps well most of the time.  No suicidal or homicidal thoughts.  Anxiety is well controlled with the Xanax.  She tries not to take it too often, and especially not at the same time she takes Vyvanse.  States that attention is good without easy distractibility.  Able to focus on things and finish tasks to completion.   Patient denies increased energy with decreased need for sleep, increased talkativeness, racing thoughts, impulsivity or risky behaviors, increased spending, increased libido, grandiosity, increased irritability or anger, paranoia, or hallucinations.  Denies dizziness, syncope, seizures, numbness, tingling, tremor, tics, unsteady gait, slurred speech, confusion. Denies muscle or joint pain, stiffness, or dystonia.  Individual Medical History/ Review of Systems: Changes? :Yes   dx w/ asthma,   Past Psychiatric History:    Past medications for mental health diagnoses include:  Vyvanse, Strattera, Celexa, hydroxyzine, Xanax, melatonin was ineffective   No history of mental health hospitalizations. No history of suicide attempts.  Allergies: Amoxicillin and Penicillins  Current Medications:  Current Outpatient Medications:    albuterol (VENTOLIN HFA) 108 (90 Base)  MCG/ACT inhaler, Inhale 2 puffs into the lungs every 6 (six) hours as needed for wheezing or shortness of breath., Disp: 3.7 g, Rfl: 11   amLODipine (NORVASC) 2.5 MG tablet, TAKE 1 TABLET(2.5 MG) BY MOUTH DAILY, Disp: 90 tablet, Rfl: 1   budesonide-formoterol (SYMBICORT) 80-4.5 MCG/ACT inhaler, Inhale 2 puffs into the lungs 2 (two) times daily., Disp: 1 each, Rfl: 3   buPROPion (WELLBUTRIN XL) 150 MG 24 hr tablet, Take 1 tablet (150 mg total) by mouth every morning., Disp: 30 tablet, Rfl: 1   hydrochlorothiazide (HYDRODIURIL) 25 MG tablet, Take 1 tablet (25 mg total) by mouth daily., Disp: 90 tablet, Rfl: 3   hydrOXYzine (ATARAX) 10 MG tablet, Take 1-2 tablets (10-20 mg total) by mouth 3 (three) times daily as needed for anxiety (or sleep)., Disp: 60 tablet, Rfl: 2   levonorgestrel (MIRENA) 20 MCG/24HR IUD, 1 each by Intrauterine route once., Disp: , Rfl:    Multiple Vitamin (MULTIVITAMIN ADULT PO), Take by mouth., Disp: , Rfl:    ondansetron (ZOFRAN-ODT) 4 MG disintegrating tablet, DISSOLVE 1 TABLET ON THE TONGUE EVERY 8 HOURS AS NEEDED FOR NAUSEA OR VOMITING, Disp: 20 tablet, Rfl: 0   ALPRAZolam (XANAX) 0.25 MG tablet, Take 1 tablet (0.25 mg total) by mouth daily as needed for anxiety., Disp: 30 tablet, Rfl: 5   citalopram (CELEXA) 40 MG tablet, Take 1 tablet (40 mg total) by mouth daily., Disp: 90 tablet, Rfl: 3   [START ON 07/10/2023] lisdexamfetamine (VYVANSE) 40 MG capsule, Take 1 capsule (40 mg total) by mouth every morning., Disp: 30 capsule, Rfl: 0   [START ON 08/08/2023] lisdexamfetamine (VYVANSE) 40  MG capsule, Take 1 capsule (40 mg total) by mouth every morning., Disp: 30 capsule, Rfl: 0   [START ON 09/07/2023] lisdexamfetamine (VYVANSE) 40 MG capsule, Take 1 capsule (40 mg total) by mouth every morning., Disp: 30 capsule, Rfl: 0 Medication Side Effects: none  Family Medical/ Social History: Changes? No  MENTAL HEALTH EXAM:  There were no vitals taken for this visit.There is no height  or weight on file to calculate BMI.  General Appearance: Casual and Well Groomed  Eye Contact:  Good  Speech:  Clear and Coherent and Normal Rate  Volume:  Normal  Mood:  Euthymic  Affect:  Congruent  Thought Process:  Goal Directed and Descriptions of Associations: Circumstantial  Orientation:  Full (Time, Place, and Person)  Thought Content: Logical   Suicidal Thoughts:  No  Homicidal Thoughts:  No  Memory:  WNL  Judgement:  Good  Insight:  Good  Psychomotor Activity:  Normal  Concentration:  Concentration: Good and Attention Span: Good  Recall:  Good  Fund of Knowledge: Good  Language: Good  Assets:  Communication Skills Desire for Improvement Financial Resources/Insurance Housing Transportation Vocational/Educational  ADL's:  Intact  Cognition: WNL  Prognosis:  Good   DIAGNOSES:    ICD-10-CM   1. Seasonal affective disorder (HCC)  F33.8     2. GAD (generalized anxiety disorder)  F41.1 ALPRAZolam (XANAX) 0.25 MG tablet    3. Attention deficit hyperactivity disorder (ADHD), combined type  F90.2      Receiving Psychotherapy: No   RECOMMENDATIONS:  PDMP reviewed.  Vyvanse filled 06/10/2023.  Xanax filled 06/10/2023. I provided 20 minutes of face to face time during this encounter, including time spent before and after the visit in records review, medical decision making, counseling pertinent to today's visit, and charting.   We discussed her symptoms of seasonal affective disorder.  I recommend adding Wellbutrin.  She thinks she has taken it before but long ago.  She is willing to try it again.  Benefits, risk and side effects were discussed and she accepts.  Continue Xanax 0.25 mg, 1 p.o. daily as needed anxiety.  Takes sparingly. Start Wellbutrin XL 150 mg, 1 p.o. every morning. Continue Celexa 40 mg, 1 p.o. daily. Continue hydroxyzine 10 mg, 1-2 3 times daily as needed anxiety or sleep. Continue Vyvanse 40 mg, 1 p.o. every morning. Continue vitamins as per med  list. Return in 6 weeks.  Melony Overly, PA-C

## 2023-08-16 ENCOUNTER — Other Ambulatory Visit: Payer: Self-pay

## 2023-08-16 MED ORDER — BUDESONIDE-FORMOTEROL FUMARATE 80-4.5 MCG/ACT IN AERO: 2.0000 | INHALATION_SPRAY | Freq: Two times a day (BID) | RESPIRATORY_TRACT | 3 refills | Status: DC

## 2023-08-23 ENCOUNTER — Telehealth: Payer: 59 | Admitting: Physician Assistant

## 2023-08-23 ENCOUNTER — Encounter: Payer: Self-pay | Admitting: Physician Assistant

## 2023-08-23 DIAGNOSIS — F5105 Insomnia due to other mental disorder: Secondary | ICD-10-CM

## 2023-08-23 DIAGNOSIS — F902 Attention-deficit hyperactivity disorder, combined type: Secondary | ICD-10-CM | POA: Diagnosis not present

## 2023-08-23 DIAGNOSIS — F411 Generalized anxiety disorder: Secondary | ICD-10-CM | POA: Diagnosis not present

## 2023-08-23 DIAGNOSIS — F338 Other recurrent depressive disorders: Secondary | ICD-10-CM

## 2023-08-23 DIAGNOSIS — F99 Mental disorder, not otherwise specified: Secondary | ICD-10-CM

## 2023-08-23 MED ORDER — BUPROPION HCL ER (XL) 150 MG PO TB24: 150.0000 mg | ORAL_TABLET | Freq: Every morning | ORAL | 1 refills | Status: DC

## 2023-08-23 NOTE — Progress Notes (Signed)
[facility]  Patient ID: Cynthia Lewis,  MRN: [id number]  PCP: Sherlene Shams, MD  Date of Evaluation: 08/23/2023 Time spent:25 minutes  Chief Complaint:  Chief Complaint   Anxiety; Depression; ADHD; Follow-up   Virtual Visit via Telehealth  I connected with patient by a video enabled telemedicine application with their informed consent, and verified patient privacy and that I am speaking with the correct person using two identifiers.  I am private, in my office and the patient is at home.  I discussed the limitations, risks, security and privacy concerns of performing an evaluation and management service by video and the availability of in person appointments. I also discussed with the patient that there may be a patient responsible charge related to this service. The patient expressed understanding and agreed to proceed.   I discussed the assessment and treatment plan with the patient. The patient was provided an opportunity to ask questions and all were answered. The patient agreed with the plan and demonstrated an understanding of the instructions.   The patient was advised to call back or seek an in-person evaluation if the symptoms worsen or if the condition fails to improve as anticipated.  I provided 25 minutes of non-face-to-face time during this encounter.  HISTORY/CURRENT STATUS: HPI  For routine med check.  We added Wellbutrin 6 weeks ago.  She's at least 50-75% better.  Feels more upbeat and has more pep.  Patient is able to enjoy things.  Energy and motivation are good.  Work is going well.   No extreme sadness, tearfulness, or feelings of hopelessness.  Sleeps well.  ADLs and personal hygiene are normal.   Appetite has not changed.  Weight is stable.  Anxiety is controlled.  Occas takes Xanax and it is effective.  Denies suicidal or homicidal thoughts.  States that attention is good without easy distractibility.  Able to focus on things and finish tasks  to completion.   Patient denies increased energy with decreased need for sleep, increased talkativeness, racing thoughts, impulsivity or risky behaviors, increased spending, increased libido, grandiosity, increased irritability or anger, paranoia, or hallucinations.  Denies dizziness, syncope, seizures, numbness, tingling, tremor, tics, unsteady gait, slurred speech, confusion. Denies muscle or joint pain, stiffness, or dystonia.  Individual Medical History/ Review of Systems: Changes? :No     Past Psychiatric History:    Past medications for mental health diagnoses include:  Vyvanse, Strattera, Celexa, hydroxyzine, Xanax, melatonin was ineffective   No history of mental health hospitalizations. No history of suicide attempts.  Allergies: Amoxicillin and Penicillins  Current Medications:  Current Outpatient Medications:    albuterol (VENTOLIN HFA) 108 (90 Base) MCG/ACT inhaler, Inhale 2 puffs into the lungs every 6 (six) hours as needed for wheezing or shortness of breath., Disp: 3.7 g, Rfl: 11   ALPRAZolam (XANAX) 0.25 MG tablet, Take 1 tablet (0.25 mg total) by mouth daily as needed for anxiety., Disp: 30 tablet, Rfl: 5   amLODipine (NORVASC) 2.5 MG tablet, TAKE 1 TABLET(2.5 MG) BY MOUTH DAILY, Disp: 90 tablet, Rfl: 1   budesonide-formoterol (SYMBICORT) 80-4.5 MCG/ACT inhaler, Inhale 2 puffs into the lungs 2 (two) times daily., Disp: 1 each, Rfl: 3   citalopram (CELEXA) 40 MG tablet, Take 1 tablet (40 mg total) by mouth daily., Disp: 90 tablet, Rfl: 3   hydrochlorothiazide (HYDRODIURIL) 25 MG tablet, Take 1 tablet (25 mg total) by mouth daily., Disp: 90 tablet, Rfl: 3   hydrOXYzine (ATARAX) 10 MG tablet, Take 1-2 tablets (10-20 mg  total) by mouth 3 (three) times daily as needed for anxiety (or sleep)., Disp: 60 tablet, Rfl: 2   levonorgestrel (MIRENA) 20 MCG/24HR IUD, 1 each by Intrauterine route once., Disp: , Rfl:    lisdexamfetamine (VYVANSE) 40 MG capsule, Take 1 capsule (40 mg  total) by mouth every morning., Disp: 30 capsule, Rfl: 0   lisdexamfetamine (VYVANSE) 40 MG capsule, Take 1 capsule (40 mg total) by mouth every morning., Disp: 30 capsule, Rfl: 0   [START ON 09/07/2023] lisdexamfetamine (VYVANSE) 40 MG capsule, Take 1 capsule (40 mg total) by mouth every morning., Disp: 30 capsule, Rfl: 0   Multiple Vitamin (MULTIVITAMIN ADULT PO), Take by mouth., Disp: , Rfl:    ondansetron (ZOFRAN-ODT) 4 MG disintegrating tablet, DISSOLVE 1 TABLET ON THE TONGUE EVERY 8 HOURS AS NEEDED FOR NAUSEA OR VOMITING, Disp: 20 tablet, Rfl: 0   buPROPion (WELLBUTRIN XL) 150 MG 24 hr tablet, Take 1 tablet (150 mg total) by mouth every morning., Disp: 90 tablet, Rfl: 1 Medication Side Effects: none  Family Medical/ Social History: Changes? No  MENTAL HEALTH EXAM:  There were no vitals taken for this visit.There is no height or weight on file to calculate BMI.  General Appearance: Casual and Well Groomed  Eye Contact:  Good  Speech:  Clear and Coherent and Normal Rate  Volume:  Normal  Mood:  Euthymic  Affect:  Congruent  Thought Process:  Goal Directed and Descriptions of Associations: Circumstantial  Orientation:  Full (Time, Place, and Person)  Thought Content: Logical   Suicidal Thoughts:  No  Homicidal Thoughts:  No  Memory:  WNL  Judgement:  Good  Insight:  Good  Psychomotor Activity:  Normal  Concentration:  Concentration: Good and Attention Span: Good  Recall:  Good  Fund of Knowledge: Good  Language: Good  Assets:  Communication Skills Desire for Improvement Financial Resources/Insurance Housing Resilience Transportation Vocational/Educational  ADL's:  Intact  Cognition: WNL  Prognosis:  Good   DIAGNOSES:    ICD-10-CM   1. Seasonal affective disorder (HCC)  F33.8     2. Attention deficit hyperactivity disorder (ADHD), combined type  F90.2     3. GAD (generalized anxiety disorder)  F41.1     4. Insomnia due to other mental disorder  F51.05    F99       Receiving Psychotherapy: No   RECOMMENDATIONS:  PDMP reviewed.  Vyvanse filled 06/10/2023.  Xanax filled 07/05/2023. I provided 25 minutes of non-face to face time during this encounter, including time spent before and after the visit in records review, medical decision making, counseling pertinent to today's visit, and charting.   She has responded well to the addition of Wellbutrin so no changes will be made.  She understands that the dose can be increased if needed, she could call between visits if the seasonal affect disorder seems to be worsening and we would go up to 300 mg.  Continue Xanax 0.25 mg, 1 p.o. daily as needed anxiety.  Takes sparingly. Continue Wellbutrin XL 150 mg, 1 p.o. every morning. Continue Celexa 40 mg, 1 p.o. daily. Continue hydroxyzine 10 mg, 1-2 3 times daily as needed anxiety or sleep. Continue Vyvanse 40 mg, 1 p.o. every morning. Continue vitamins as per med list. Return in 3 months.     Melony Overly, PA-C

## 2023-08-24 ENCOUNTER — Other Ambulatory Visit: Payer: Self-pay

## 2023-08-24 MED ORDER — BUDESONIDE-FORMOTEROL FUMARATE 80-4.5 MCG/ACT IN AERO: 2.0000 | INHALATION_SPRAY | Freq: Two times a day (BID) | RESPIRATORY_TRACT | 3 refills | Status: DC

## 2023-09-09 ENCOUNTER — Other Ambulatory Visit: Payer: Self-pay | Admitting: Physician Assistant

## 2023-09-09 NOTE — Telephone Encounter (Signed)
Refused rx was ordered 12/30 with 90 day supply with 1 rf.

## 2023-09-24 DIAGNOSIS — G4733 Obstructive sleep apnea (adult) (pediatric): Secondary | ICD-10-CM

## 2023-10-08 DIAGNOSIS — G4733 Obstructive sleep apnea (adult) (pediatric): Secondary | ICD-10-CM | POA: Diagnosis not present

## 2023-10-12 ENCOUNTER — Encounter: Payer: Self-pay | Admitting: Internal Medicine

## 2023-10-12 NOTE — Telephone Encounter (Signed)
Don't see any results yet. Need to follow up with [facility] to see what day it was scheduled.

## 2023-10-13 ENCOUNTER — Encounter: Payer: Self-pay | Admitting: Internal Medicine

## 2023-10-13 NOTE — Telephone Encounter (Signed)
Can we set her up for a virtual visit to review results on Friday?

## 2023-10-13 NOTE — Telephone Encounter (Signed)
Sleep Study has been scanned into Epic

## 2023-10-13 NOTE — Telephone Encounter (Signed)
It was read on 10/08/23 but not scanned into Epic yet

## 2023-10-15 ENCOUNTER — Encounter: Payer: Self-pay | Admitting: Internal Medicine

## 2023-10-15 ENCOUNTER — Telehealth: Payer: Managed Care, Other (non HMO) | Admitting: Internal Medicine

## 2023-10-15 DIAGNOSIS — J4530 Mild persistent asthma, uncomplicated: Secondary | ICD-10-CM

## 2023-10-15 DIAGNOSIS — G4733 Obstructive sleep apnea (adult) (pediatric): Secondary | ICD-10-CM

## 2023-10-15 NOTE — Patient Instructions (Signed)
It was a pleasure to see you today!  Please schedule follow up scheduled with myself in 6 months.  If my schedule is not open yet, we will contact you with a reminder closer to that time. Please call [phone] if you haven't heard from [place] a month before, and always call us sooner if issues or concerns arise. You can also send [place] a message through MyChart, but but aware that this is not to be used for urgent issues and it may take up to 5-7 days to receive a reply. Please be aware that you will likely be able to view your results before I have a chance to respond to them. Please give [place] 5 business days to respond to any non-urgent results.     Continue symbicort 2 puffs twice daily for now, gargle after use.   Continue albuterol inhaler as needed and before exercise.   Your sleep study shows mild sleep apnea. Your oxygen levels did drop below 89%. I suspect the sleep study likely underestimates the degree of sleep apnea and the next step would be an in lab study. However, because your symptoms are mild I support your choice to wait 6 months, and monitor your condition with weight loss and lifestyle modification such as avoiding sleeping in the supine position.

## 2023-10-15 NOTE — Progress Notes (Signed)
Cynthia Lewis    [id number]    [birth year]  Primary Care Physician:Tullo, Mar Daring, MD Date of Appointment: 10/15/2023 Established Patient Visit  Chief complaint:   Chief Complaint  Patient presents with   Sleep Apnea     HPI: Cynthia Lewis is a 38 y.o. woman with history of moderate persistent asthma and concern for OSA.  Interval Updates: Had home sleep study which showed mild OSA 5.3 events/hr.  Asthma doing really well. Adherent to symbicort 80 mcg 2 puffs twice daily.   Takes albuterol prior to exercise which helps a lot.  Back to hiking. Walking the dog. Goes on treadmill at the gym.  Working on losing weight on her own, adjusting.   No asthma flares - did have breakthrough symptoms when she missed her symbicort.   Does feel sleepy during the day, but usually wakes up for reasons unrelated to her sleep. Her boyfriend has sleep apnea and does not feel her sleep apnea as severe as his.   She would prefer not to use CPAP if she doesn't have to. Has been modifying diet and lifestyle and has lost some weight already.    I have reviewed the patient's family social and past medical history and updated as appropriate.   Past Medical History:  Diagnosis Date   ADHD    Anxiety    Depression    Hx of dysplastic nevus 2012   Left medial buttock    Hypertension    Hypertension     Past Surgical History:  Procedure Laterality Date   ORIF ANKLE FRACTURE Left 01/12/2021   Procedure: OPEN REDUCTION INTERNAL FIXATION (ORIF) ANKLE FRACTURE;  Surgeon: Juanell Fairly, MD;  Location: [facility] ORS;  Service: Orthopedics;  Laterality: Left;    Family History  Problem Relation Age of Onset   Hypertension Mother    Anxiety disorder Mother    Ovarian cancer Mother    ADD / ADHD Father    Depression Father    Anxiety disorder Father    Diabetes Father    Hypertension Father    Hyperlipidemia Father    Healthy Brother    Diabetes Maternal Grandfather     Heart failure Maternal Grandfather    Heart failure Maternal Grandmother    Anxiety disorder Maternal Grandmother    Diabetes Paternal Grandfather    Cancer Paternal Grandfather    Cancer Paternal Grandmother 19       breast ca   Diabetes Paternal Grandmother     Social History   Occupational History   Not on file  Tobacco Use   Smoking status: Never   Smokeless tobacco: Never  Vaping Use   Vaping status: Never Used  Substance and Sexual Activity   Alcohol use: Yes    Alcohol/week: 1.0 standard drink of alcohol    Types: 1 Standard drinks or equivalent per week   Drug use: No   Sexual activity: Yes    Birth control/protection: I.U.D., None     Physical Exam: There were no vitals taken for this visit. Tele visit  No distress Breathing non labored No wheeze Speaks in full sentences.    Data Reviewed: Imaging: I have personally reviewed the   PFTs:     Latest Ref Rng & Units 04/22/2023   12:42 PM  PFT Results  FVC-Pre L 3.52   FVC-Predicted Pre % 88   FVC-Post L 3.83   FVC-Predicted Post % 96   Pre FEV1/FVC % %  62   Post FEV1/FCV % % 68   FEV1-Pre L 2.19   FEV1-Predicted Pre % 66   FEV1-Post L 2.62   DLCO uncorrected ml/min/mmHg 28.02   DLCO UNC% % 118   DLVA Predicted % 114   TLC L 6.64   TLC % Predicted % 124   RV % Predicted % 161    I have personally reviewed the patient's PFTs and moderate airflow limitation  Labs:  Immunization status: Immunization History  Administered Date(s) Administered   Influenza Split 11/03/2012   Influenza, Seasonal, Injecte, Preservative Fre 06/09/2023   Influenza,inj,Quad PF,6+ Mos 05/20/2018, 05/10/2019, 06/26/2022   Influenza-Unspecified 06/25/2014, 07/04/2020, 05/21/2021   PFIZER(Purple Top)SARS-COV-2 Vaccination 10/29/2019, 11/20/2019, 08/23/2020, 03/17/2021   Respiratory Syncytial Virus Vaccine,Recomb Aduvanted(Arexvy) 06/09/2023   Tdap 11/03/2012    External Records Personally Reviewed: sleep  study  Assessment:  Mild persistent asthma, controlled Mild OSA, new diagnosis  Plan/Recommendations:   Continue symbicort 2 puffs twice daily for now, gargle after use.   Continue albuterol inhaler as needed and before exercise.   Your sleep study shows mild sleep apnea. Your oxygen levels did drop below 89%. I suspect the sleep study likely underestimates the degree of sleep apnea and the next step would be an in lab study. However, because your symptoms are mild I support your choice to wait 6 months, and monitor your condition with weight loss and lifestyle modification such as avoiding sleeping in the supine position.   Return to Care: Return in about 6 months (around 04/13/2024).   Durel Salts, MD Pulmonary and Critical Care Medicine [facility] Office:[phone]

## 2023-11-15 ENCOUNTER — Ambulatory Visit (INDEPENDENT_AMBULATORY_CARE_PROVIDER_SITE_OTHER): Admitting: Physician Assistant

## 2023-11-15 ENCOUNTER — Encounter: Payer: Self-pay | Admitting: Physician Assistant

## 2023-11-15 DIAGNOSIS — F338 Other recurrent depressive disorders: Secondary | ICD-10-CM | POA: Diagnosis not present

## 2023-11-15 DIAGNOSIS — F902 Attention-deficit hyperactivity disorder, combined type: Secondary | ICD-10-CM

## 2023-11-15 DIAGNOSIS — F411 Generalized anxiety disorder: Secondary | ICD-10-CM | POA: Diagnosis not present

## 2023-11-15 MED ORDER — LISDEXAMFETAMINE DIMESYLATE 40 MG PO CAPS: 40.0000 mg | ORAL_CAPSULE | ORAL | 0 refills | Status: DC

## 2023-11-15 NOTE — Progress Notes (Signed)
 [facility]  Patient ID: Cynthia Lewis,  MRN: [id number]  PCP: Sherlene Shams, MD  Date of Evaluation: 11/15/2023 Time spent:25 minutes  Chief Complaint:  Chief Complaint   ADHD; Anxiety; Depression; Follow-up    HISTORY/CURRENT STATUS: HPI  For routine med check.  Doing well. Patient is able to enjoy things.  Energy and motivation are good.  Work is going well.   No extreme sadness, tearfulness, or feelings of hopelessness.  Sleeps well most of the time. ADLs and personal hygiene are normal.   Appetite has not changed.  Weight is stable.  Rarely needs the Xanax but it is helpful when needed. Denies suicidal or homicidal thoughts.  States that attention is good without easy distractibility.  Able to focus on things and finish tasks to completion.   Patient denies increased energy with decreased need for sleep, increased talkativeness, racing thoughts, impulsivity or risky behaviors, increased spending, increased libido, grandiosity, increased irritability or anger, paranoia, or hallucinations.  Denies dizziness, syncope, seizures, numbness, tingling, tremor, tics, unsteady gait, slurred speech, confusion. Denies muscle or joint pain, stiffness, or dystonia.  Individual Medical History/ Review of Systems: Changes? :No     Past Psychiatric History:    Past medications for mental health diagnoses include:  Vyvanse, Strattera, Celexa, hydroxyzine, Xanax, melatonin was ineffective   No history of mental health hospitalizations. No history of suicide attempts.  Allergies: Amoxicillin and Penicillins  Current Medications:  Current Outpatient Medications:    albuterol (VENTOLIN HFA) 108 (90 Base) MCG/ACT inhaler, Inhale 2 puffs into the lungs every 6 (six) hours as needed for wheezing or shortness of breath., Disp: 3.7 g, Rfl: 11   ALPRAZolam (XANAX) 0.25 MG tablet, Take 1 tablet (0.25 mg total) by mouth daily as needed for anxiety., Disp: 30 tablet, Rfl: 5    amLODipine (NORVASC) 2.5 MG tablet, TAKE 1 TABLET(2.5 MG) BY MOUTH DAILY, Disp: 90 tablet, Rfl: 1   budesonide-formoterol (SYMBICORT) 80-4.5 MCG/ACT inhaler, Inhale 2 puffs into the lungs 2 (two) times daily., Disp: 1 each, Rfl: 3   buPROPion (WELLBUTRIN XL) 150 MG 24 hr tablet, Take 1 tablet (150 mg total) by mouth every morning., Disp: 90 tablet, Rfl: 1   citalopram (CELEXA) 40 MG tablet, Take 1 tablet (40 mg total) by mouth daily., Disp: 90 tablet, Rfl: 3   hydrochlorothiazide (HYDRODIURIL) 25 MG tablet, Take 1 tablet (25 mg total) by mouth daily., Disp: 90 tablet, Rfl: 3   hydrOXYzine (ATARAX) 10 MG tablet, Take 1-2 tablets (10-20 mg total) by mouth 3 (three) times daily as needed for anxiety (or sleep)., Disp: 60 tablet, Rfl: 2   levonorgestrel (MIRENA) 20 MCG/24HR IUD, 1 each by Intrauterine route once., Disp: , Rfl:    lisdexamfetamine (VYVANSE) 40 MG capsule, Take 1 capsule (40 mg total) by mouth every morning., Disp: 30 capsule, Rfl: 0   [START ON 12/15/2023] lisdexamfetamine (VYVANSE) 40 MG capsule, Take 1 capsule (40 mg total) by mouth every morning., Disp: 30 capsule, Rfl: 0   [START ON 01/13/2024] lisdexamfetamine (VYVANSE) 40 MG capsule, Take 1 capsule (40 mg total) by mouth every morning., Disp: 30 capsule, Rfl: 0   Multiple Vitamin (MULTIVITAMIN ADULT PO), Take by mouth. (Patient not taking: Reported on 11/15/2023), Disp: , Rfl:    ondansetron (ZOFRAN-ODT) 4 MG disintegrating tablet, DISSOLVE 1 TABLET ON THE TONGUE EVERY 8 HOURS AS NEEDED FOR NAUSEA OR VOMITING (Patient not taking: Reported on 11/15/2023), Disp: 20 tablet, Rfl: 0 Medication Side Effects: none  Family Medical/ Social  History: Changes? No  MENTAL HEALTH EXAM:  There were no vitals taken for this visit.There is no height or weight on file to calculate BMI.  General Appearance: Casual and Well Groomed  Eye Contact:  Good  Speech:  Clear and Coherent and Normal Rate  Volume:  Normal  Mood:  Euthymic  Affect:  Congruent   Thought Process:  Goal Directed and Descriptions of Associations: Circumstantial  Orientation:  Full (Time, Place, and Person)  Thought Content: Logical   Suicidal Thoughts:  No  Homicidal Thoughts:  No  Memory:  WNL  Judgement:  Good  Insight:  Good  Psychomotor Activity:  Normal  Concentration:  Concentration: Good and Attention Span: Good  Recall:  Good  Fund of Knowledge: Good  Language: Good  Assets:  Communication Skills Desire for Improvement Financial Resources/Insurance Housing Physical Health Resilience Transportation Vocational/Educational  ADL's:  Intact  Cognition: WNL  Prognosis:  Good   DIAGNOSES:    ICD-10-CM   1. Seasonal affective disorder (HCC)  F33.8     2. Attention deficit hyperactivity disorder (ADHD), combined type  F90.2     3. GAD (generalized anxiety disorder)  F41.1       Receiving Psychotherapy: No   RECOMMENDATIONS:  PDMP reviewed.  Vyvanse filled 09/27/2023.  Xanax filled 10/26/2023. I provided 20 minutes of face to face time during this encounter, including time spent before and after the visit in records review, medical decision making, counseling pertinent to today's visit, and charting.   She is doing well so no changes need to be made.  Continue Xanax 0.25 mg, 1 p.o. daily as needed anxiety.  Takes sparingly. Continue Wellbutrin XL 150 mg, 1 p.o. every morning. Continue Celexa 40 mg, 1 p.o. daily. Continue hydroxyzine 10 mg, 1-2 3 times daily as needed anxiety or sleep. Continue Vyvanse 40 mg, 1 p.o. every morning. Continue vitamins as per med list. Return in 6 months.     Melony Overly, PA-C

## 2023-11-29 ENCOUNTER — Other Ambulatory Visit: Payer: Self-pay

## 2023-11-29 MED ORDER — BUDESONIDE-FORMOTEROL FUMARATE 80-4.5 MCG/ACT IN AERO: 2.0000 | INHALATION_SPRAY | Freq: Two times a day (BID) | RESPIRATORY_TRACT | 3 refills | Status: DC

## 2023-12-18 ENCOUNTER — Encounter: Payer: Self-pay | Admitting: Internal Medicine

## 2023-12-18 DIAGNOSIS — I10 Essential (primary) hypertension: Secondary | ICD-10-CM

## 2023-12-21 MED ORDER — AMLODIPINE BESYLATE 5 MG PO TABS: 5.0000 mg | ORAL_TABLET | Freq: Every day | ORAL | 2 refills | Status: DC

## 2023-12-21 NOTE — Assessment & Plan Note (Signed)
 She has requested suspension of hydrochlorothiazide  due to excessive sweating. Advised to increase amlodipine  to 5 mg daily

## 2023-12-28 ENCOUNTER — Other Ambulatory Visit: Payer: Self-pay

## 2023-12-28 MED ORDER — AMLODIPINE BESYLATE 5 MG PO TABS: 5.0000 mg | ORAL_TABLET | Freq: Every day | ORAL | 1 refills | Status: DC

## 2024-02-18 ENCOUNTER — Other Ambulatory Visit: Payer: Self-pay | Admitting: Physician Assistant

## 2024-02-18 DIAGNOSIS — F411 Generalized anxiety disorder: Secondary | ICD-10-CM

## 2024-03-07 ENCOUNTER — Other Ambulatory Visit: Payer: Self-pay

## 2024-03-07 ENCOUNTER — Telehealth: Payer: Self-pay | Admitting: Physician Assistant

## 2024-03-07 MED ORDER — LISDEXAMFETAMINE DIMESYLATE 40 MG PO CAPS: 40.0000 mg | ORAL_CAPSULE | ORAL | 0 refills | Status: DC

## 2024-03-07 NOTE — Telephone Encounter (Signed)
 Pended.

## 2024-03-07 NOTE — Telephone Encounter (Signed)
 Pt requesting Rx lisdexamfetamine 40 mg to [facility]. Apt 9/25

## 2024-05-15 ENCOUNTER — Other Ambulatory Visit: Payer: Self-pay | Admitting: Internal Medicine

## 2024-05-18 ENCOUNTER — Ambulatory Visit: Admitting: Physician Assistant

## 2024-05-30 ENCOUNTER — Other Ambulatory Visit: Payer: Self-pay | Admitting: Internal Medicine

## 2024-05-30 MED ORDER — BUDESONIDE-FORMOTEROL FUMARATE 80-4.5 MCG/ACT IN AERO: 2.0000 | INHALATION_SPRAY | Freq: Two times a day (BID) | RESPIRATORY_TRACT | 0 refills | Status: DC

## 2024-05-30 NOTE — Telephone Encounter (Signed)
 Copied from CRM [id number]. Topic: Clinical - Medication Refill >> May 30, 2024  1:23 PM Leah C wrote: Medication: budesonide -formoterol  (SYMBICORT ) 80-4.5 MCG/ACT inhaler  Has the patient contacted their pharmacy? Pharmacy called and stated they sent the refill request on 10/03 and nothing was received back.   (Agent: If no, request that the patient contact the pharmacy for the refill. If patient does not wish to contact the pharmacy document the reason why and proceed with request.) (Agent: If yes, when and what did the pharmacy advise?)  This is the patient's preferred pharmacy:  [id number] - [place], [place] - [address] AT [facility] [address] [facility] [address] [place] [address] Phone: [phone] Fax: [phone]   Is this the correct pharmacy for this prescription? Yes If no, delete pharmacy and type the correct one.   Has the prescription been filled recently? Yes  Is the patient out of the medication? Yes  Has the patient been seen for an appointment in the last year OR does the patient have an upcoming appointment? Yes  Can we respond through MyChart? Yes  Agent: Please be advised that Rx refills may take up to 3 business days. We ask that you follow-up with your pharmacy.

## 2024-06-12 ENCOUNTER — Encounter: Payer: Self-pay | Admitting: Physician Assistant

## 2024-06-12 ENCOUNTER — Telehealth (INDEPENDENT_AMBULATORY_CARE_PROVIDER_SITE_OTHER): Admitting: Physician Assistant

## 2024-06-12 DIAGNOSIS — F902 Attention-deficit hyperactivity disorder, combined type: Secondary | ICD-10-CM | POA: Diagnosis not present

## 2024-06-12 DIAGNOSIS — F5105 Insomnia due to other mental disorder: Secondary | ICD-10-CM | POA: Diagnosis not present

## 2024-06-12 DIAGNOSIS — F32A Depression, unspecified: Secondary | ICD-10-CM

## 2024-06-12 DIAGNOSIS — F411 Generalized anxiety disorder: Secondary | ICD-10-CM | POA: Diagnosis not present

## 2024-06-12 DIAGNOSIS — F99 Mental disorder, not otherwise specified: Secondary | ICD-10-CM

## 2024-06-12 DIAGNOSIS — F338 Other recurrent depressive disorders: Secondary | ICD-10-CM | POA: Diagnosis not present

## 2024-06-12 MED ORDER — LISDEXAMFETAMINE DIMESYLATE 40 MG PO CAPS: 40.0000 mg | ORAL_CAPSULE | ORAL | 0 refills | Status: AC

## 2024-06-12 MED ORDER — ALPRAZOLAM 0.25 MG PO TABS
0.2500 mg | ORAL_TABLET | Freq: Every day | ORAL | 5 refills | Status: AC | PRN
Start: 2024-06-12 — End: ?

## 2024-06-12 MED ORDER — BUPROPION HCL ER (XL) 300 MG PO TB24: 300.0000 mg | ORAL_TABLET | ORAL | 1 refills | Status: DC

## 2024-06-12 MED ORDER — CITALOPRAM HYDROBROMIDE 40 MG PO TABS: 40.0000 mg | ORAL_TABLET | Freq: Every day | ORAL | 3 refills | Status: DC

## 2024-06-12 NOTE — Progress Notes (Signed)
 [facility]  Patient ID: Cynthia Lewis,  MRN: [id number]  PCP: Marylynn Verneita CROME, MD  Date of Evaluation: 06/12/2024 Time spent:20 minutes  Chief Complaint:  Chief Complaint   ADD; Anxiety; Depression; Follow-up   Virtual Visit via Telehealth  I connected with patient by a video enabled telemedicine application with their informed consent, and verified patient privacy and that I am speaking with the correct person using two identifiers.  I am private, in my office and the patient is at home.  I discussed the limitations, risks, security and privacy concerns of performing an evaluation and management service by video and the availability of in person appointments. I also discussed with the patient that there may be a patient responsible charge related to this service. The patient expressed understanding and agreed to proceed.   I discussed the assessment and treatment plan with the patient. The patient was provided an opportunity to ask questions and all were answered. The patient agreed with the plan and demonstrated an understanding of the instructions.   The patient was advised to call back or seek an in-person evaluation if the symptoms worsen or if the condition fails to improve as anticipated.  I provided approximately 20  minutes of non-face-to-face time during this encounter.  HISTORY/CURRENT STATUS: HPI  For routine 6 month med check.  Not sure if the Wellbutrin  is working as well as it should.  It is hard to say how long it has been going on.  By around 7 PM she does not have much energy left, when she initially started the Wellbutrin  it seemed to help all throughout the day.  She feels mildly depressed, not sure if it is the time of year or what.  She is able to enjoy things.  No extreme sadness or feelings of hopelessness.  ADLs and personal hygiene are normal.  Appetite is normal.  Work is going okay but very busy.  Anxiety is well-controlled.  Sleeps okay.   Takes the Xanax  when needed and tries to avoid it close to the time that she takes the Vyvanse .  Vyvanse  is still effective.States that attention is good without easy distractibility.  Able to focus on things and finish tasks to completion.  No mania, delirium, or psychosis.  No SI/HI.  Individual Medical History/ Review of Systems: Changes? :No     Past Psychiatric History:    Past medications for mental health diagnoses include:  Vyvanse , Strattera, Celexa , hydroxyzine , Xanax , melatonin was ineffective   No history of mental health hospitalizations. No history of suicide attempts.  Allergies: Amoxicillin  and Penicillins  Current Medications:  Current Outpatient Medications:    albuterol  (VENTOLIN  HFA) 108 (90 Base) MCG/ACT inhaler, Inhale 2 puffs into the lungs every 6 (six) hours as needed for wheezing or shortness of breath., Disp: 3.7 g, Rfl: 11   amLODipine  (NORVASC ) 5 MG tablet, Take 1 tablet (5 mg total) by mouth daily., Disp: 90 tablet, Rfl: 1   budesonide -formoterol  (SYMBICORT ) 80-4.5 MCG/ACT inhaler, Inhale 2 puffs into the lungs 2 (two) times daily., Disp: 10.2 g, Rfl: 0   buPROPion  (WELLBUTRIN  XL) 300 MG 24 hr tablet, Take 1 tablet (300 mg total) by mouth every morning., Disp: 30 tablet, Rfl: 1   hydrochlorothiazide  (HYDRODIURIL ) 25 MG tablet, Take 1 tablet (25 mg total) by mouth daily., Disp: 90 tablet, Rfl: 3   hydrOXYzine  (ATARAX ) 10 MG tablet, Take 1-2 tablets (10-20 mg total) by mouth 3 (three) times daily as needed for anxiety (or sleep)., Disp:  60 tablet, Rfl: 2   levonorgestrel  (MIRENA ) 20 MCG/24HR IUD, 1 each by Intrauterine route once., Disp: , Rfl:    Multiple Vitamin (MULTIVITAMIN ADULT PO), Take by mouth., Disp: , Rfl:    ondansetron  (ZOFRAN -ODT) 4 MG disintegrating tablet, DISSOLVE 1 TABLET ON THE TONGUE EVERY 8 HOURS AS NEEDED FOR NAUSEA OR VOMITING, Disp: 20 tablet, Rfl: 0   ALPRAZolam  (XANAX ) 0.25 MG tablet, Take 1 tablet (0.25 mg total) by mouth daily as needed  for anxiety., Disp: 30 tablet, Rfl: 5   citalopram  (CELEXA ) 40 MG tablet, Take 1 tablet (40 mg total) by mouth daily., Disp: 90 tablet, Rfl: 3   lisdexamfetamine (VYVANSE ) 40 MG capsule, Take 1 capsule (40 mg total) by mouth every morning., Disp: 30 capsule, Rfl: 0   [START ON 07/12/2024] lisdexamfetamine (VYVANSE ) 40 MG capsule, Take 1 capsule (40 mg total) by mouth every morning., Disp: 30 capsule, Rfl: 0   [START ON 08/10/2024] lisdexamfetamine (VYVANSE ) 40 MG capsule, Take 1 capsule (40 mg total) by mouth every morning., Disp: 30 capsule, Rfl: 0 Medication Side Effects: none  Family Medical/ Social History: Changes? No  MENTAL HEALTH EXAM:  There were no vitals taken for this visit.There is no height or weight on file to calculate BMI.  General Appearance: Casual and Well Groomed  Eye Contact:  Good  Speech:  Clear and Coherent and Normal Rate  Volume:  Normal  Mood:  Euthymic  Affect:  Congruent  Thought Process:  Goal Directed and Descriptions of Associations: Circumstantial  Orientation:  Full (Time, Place, and Person)  Thought Content: Logical   Suicidal Thoughts:  No  Homicidal Thoughts:  No  Memory:  WNL  Judgement:  Good  Insight:  Good  Psychomotor Activity:  Normal  Concentration:  Concentration: Good and Attention Span: Good  Recall:  Good  Fund of Knowledge: Good  Language: Good  Assets:  Communication Skills Desire for Improvement Financial Resources/Insurance Housing Resilience Transportation Vocational/Educational  ADL's:  Intact  Cognition: WNL  Prognosis:  Good   DIAGNOSES:    ICD-10-CM   1. Seasonal affective disorder  F33.8     2. GAD (generalized anxiety disorder)  F41.1 ALPRAZolam  (XANAX ) 0.25 MG tablet    3. Attention deficit hyperactivity disorder (ADHD), combined type  F90.2     4. Insomnia due to other mental disorder  F51.05    F99     5. Mild depression  F32.A       Receiving Psychotherapy: No   RECOMMENDATIONS:  PDMP  unavailable. I provided approximately  20 minutes of non-face-to-face time during this encounter, including time spent before and after the visit in records review, medical decision making, counseling pertinent to today's visit, and charting.   I recommend increasing the dose of Wellbutrin .  Also recommend mood therapy lamp.  She agrees with this plan.  Continue Xanax  0.25 mg, 1 p.o. daily as needed anxiety.  Takes sparingly. Increase Wellbutrin  XL to 300 mg, 1 p.o. every morning.  Continue Celexa  40 mg, 1 p.o. daily. Continue hydroxyzine  10 mg, 1-2 3 times daily as needed anxiety or sleep. Continue Vyvanse  40 mg, 1 p.o. every morning. Continue vitamins as per med list. Return in 6 to 8 weeks.  Verneita Cooks, PA-C

## 2024-07-02 ENCOUNTER — Other Ambulatory Visit: Payer: Self-pay | Admitting: Physician Assistant

## 2024-07-21 ENCOUNTER — Other Ambulatory Visit: Payer: Self-pay | Admitting: Physician Assistant

## 2024-07-21 ENCOUNTER — Other Ambulatory Visit: Payer: Self-pay | Admitting: Internal Medicine

## 2024-07-25 NOTE — Telephone Encounter (Signed)
 Called left message call office patient needs appointment and labs for refill.

## 2024-07-28 ENCOUNTER — Other Ambulatory Visit: Payer: Self-pay | Admitting: Internal Medicine

## 2024-07-28 NOTE — Telephone Encounter (Signed)
 Copied from CRM [id number]. Topic: Clinical - Medication Refill >> Jul 28, 2024  1:22 PM Thersia C wrote: Medication: hydrochlorothiazide  (HYDRODIURIL ) 25 MG tablet  Has the patient contacted their pharmacy? Yes (Agent: If no, request that the patient contact the pharmacy for the refill. If patient does not wish to contact the pharmacy document the reason why and proceed with request.) (Agent: If yes, when and what did the pharmacy advise?)  This is the patient's preferred pharmacy:  [id number] - [place], [place] - [address] AT [facility] [address] [facility] [address] [place] [address] Phone: [phone] Fax: [phone]     Is this the correct pharmacy for this prescription? Yes If no, delete pharmacy and type the correct one.   Has the prescription been filled recently? No  Is the patient out of the medication? Yes  Has the patient been seen for an appointment in the last year OR does the patient have an upcoming appointment? Yes  Can we respond through MyChart? Yes  Agent: Please be advised that Rx refills may take up to 3 business days. We ask that you follow-up with your pharmacy.

## 2024-08-03 ENCOUNTER — Encounter: Payer: Self-pay | Admitting: Physician Assistant

## 2024-08-03 ENCOUNTER — Telehealth: Admitting: Physician Assistant

## 2024-08-03 DIAGNOSIS — F99 Mental disorder, not otherwise specified: Secondary | ICD-10-CM | POA: Diagnosis not present

## 2024-08-03 DIAGNOSIS — F902 Attention-deficit hyperactivity disorder, combined type: Secondary | ICD-10-CM | POA: Diagnosis not present

## 2024-08-03 DIAGNOSIS — F338 Other recurrent depressive disorders: Secondary | ICD-10-CM | POA: Diagnosis not present

## 2024-08-03 DIAGNOSIS — F5105 Insomnia due to other mental disorder: Secondary | ICD-10-CM

## 2024-08-03 DIAGNOSIS — F411 Generalized anxiety disorder: Secondary | ICD-10-CM | POA: Diagnosis not present

## 2024-08-03 MED ORDER — TRAZODONE HCL 100 MG PO TABS: 50.0000 mg | ORAL_TABLET | Freq: Every evening | ORAL | 0 refills | Status: DC | PRN

## 2024-08-03 NOTE — Progress Notes (Signed)
 [facility]  Patient ID: Cynthia Lewis,  MRN: [id number]  PCP: Marylynn Verneita CROME, MD  Date of Evaluation: 08/03/2024 Time spent:20 minutes  Chief Complaint:  Chief Complaint   Anxiety; Depression; ADHD; Follow-up    Virtual Visit via Telehealth  I connected with patient by a video enabled telemedicine application with their informed consent, and verified patient privacy and that I am speaking with the correct person using two identifiers.  I am private, in my office and the patient is at home.  I discussed the limitations, risks, security and privacy concerns of performing an evaluation and management service by video and the availability of in person appointments. I also discussed with the patient that there may be a patient responsible charge related to this service. The patient expressed understanding and agreed to proceed.   I discussed the assessment and treatment plan with the patient. The patient was provided an opportunity to ask questions and all were answered. The patient agreed with the plan and demonstrated an understanding of the instructions.   The patient was advised to call back or seek an in-person evaluation if the symptoms worsen or if the condition fails to improve as anticipated.  I provided approximately 20  minutes of non-face-to-face time during this encounter.  HISTORY/CURRENT STATUS: HPI  For routine 6 month med check.  We increased the Wellbutrin  at the last visit.  States she is at least 50% better. Energy and motivation are good.  Work is going well.   No reports of extreme sadness, tearfulness, or feelings of hopelessness.  ADLs and personal hygiene are normal.   States that attention is good without easy distractibility.  Able to focus on things and finish tasks to completion.  Appetite has not changed.  Weight is stable.   No mania, delirium, AH/VH.  No SI/HI.  Her main problem right now is not sleeping well.  She has a hard time going to  sleep and staying asleep.  She has taken the hydroxyzine  for it in the past but it causes her to be extremely sedated for the next 24 hours, even at only 10 mg.  Individual Medical History/ Review of Systems: Changes? :No     Past Psychiatric History:    Past medications for mental health diagnoses include:  Vyvanse , Strattera, Celexa , hydroxyzine , Xanax , melatonin was ineffective   No history of mental health hospitalizations. No history of suicide attempts.  Allergies: Amoxicillin  and Penicillins  Current Medications:  Current Outpatient Medications:    albuterol  (VENTOLIN  HFA) 108 (90 Base) MCG/ACT inhaler, Inhale 2 puffs into the lungs every 6 (six) hours as needed for wheezing or shortness of breath., Disp: 3.7 g, Rfl: 11   ALPRAZolam  (XANAX ) 0.25 MG tablet, Take 1 tablet (0.25 mg total) by mouth daily as needed for anxiety., Disp: 30 tablet, Rfl: 5   amLODipine  (NORVASC ) 5 MG tablet, Take 1 tablet (5 mg total) by mouth daily., Disp: 90 tablet, Rfl: 1   budesonide -formoterol  (SYMBICORT ) 80-4.5 MCG/ACT inhaler, Inhale 2 puffs into the lungs 2 (two) times daily., Disp: 10.2 g, Rfl: 0   buPROPion  (WELLBUTRIN  XL) 300 MG 24 hr tablet, TAKE 1 TABLET BY MOUTH EVERY  MORNING, Disp: 90 tablet, Rfl: 0   citalopram  (CELEXA ) 40 MG tablet, TAKE 1 TABLET(40 MG) BY MOUTH DAILY, Disp: 90 tablet, Rfl: 0   hydrOXYzine  (ATARAX ) 10 MG tablet, Take 1-2 tablets (10-20 mg total) by mouth 3 (three) times daily as needed for anxiety (or sleep)., Disp: 60 tablet, Rfl:  2   levonorgestrel  (MIRENA ) 20 MCG/24HR IUD, 1 each by Intrauterine route once., Disp: , Rfl:    lisdexamfetamine (VYVANSE ) 40 MG capsule, Take 1 capsule (40 mg total) by mouth every morning., Disp: 30 capsule, Rfl: 0   lisdexamfetamine (VYVANSE ) 40 MG capsule, Take 1 capsule (40 mg total) by mouth every morning., Disp: 30 capsule, Rfl: 0   [START ON 08/10/2024] lisdexamfetamine (VYVANSE ) 40 MG capsule, Take 1 capsule (40 mg total) by mouth every  morning., Disp: 30 capsule, Rfl: 0   Multiple Vitamin (MULTIVITAMIN ADULT PO), Take by mouth., Disp: , Rfl:    ondansetron  (ZOFRAN -ODT) 4 MG disintegrating tablet, DISSOLVE 1 TABLET ON THE TONGUE EVERY 8 HOURS AS NEEDED FOR NAUSEA OR VOMITING, Disp: 20 tablet, Rfl: 0   traZODone (DESYREL) 100 MG tablet, Take 0.5-2 tablets (50-200 mg total) by mouth at bedtime as needed for sleep., Disp: 60 tablet, Rfl: 0   hydrochlorothiazide  (HYDRODIURIL ) 25 MG tablet, Take 1 tablet (25 mg total) by mouth daily. (Patient not taking: Reported on 08/03/2024), Disp: 90 tablet, Rfl: 3 Medication Side Effects: none  Family Medical/ Social History: Changes? No  MENTAL HEALTH EXAM:  There were no vitals taken for this visit.There is no height or weight on file to calculate BMI.  General Appearance: Casual and Well Groomed  Eye Contact:  Good  Speech:  Clear and Coherent and Normal Rate  Volume:  Normal  Mood:  Euthymic  Affect:  Congruent  Thought Process:  Goal Directed and Descriptions of Associations: Circumstantial  Orientation:  Full (Time, Place, and Person)  Thought Content: Logical   Suicidal Thoughts:  No  Homicidal Thoughts:  No  Memory:  WNL  Judgement:  Good  Insight:  Good  Psychomotor Activity:  Normal  Concentration:  Concentration: Good and Attention Span: Good  Recall:  Good  Fund of Knowledge: Good  Language: Good  Assets:  Communication Skills Desire for Improvement Financial Resources/Insurance Housing Resilience Social Support Transportation Vocational/Educational  ADL's:  Intact  Cognition: WNL  Prognosis:  Good   DIAGNOSES:    ICD-10-CM   1. Seasonal affective disorder  F33.8     2. GAD (generalized anxiety disorder)  F41.1     3. Attention deficit hyperactivity disorder (ADHD), combined type  F90.2     4. Insomnia due to other mental disorder  F51.05    F99      Receiving Psychotherapy: No   RECOMMENDATIONS:  PDMP reviewed.  Vyvanse  filled 07/13/2024.  Xanax   filled 06/13/2024. I provided approximately 20 minutes of non-face-to-face time during this encounter, including time spent before and after the visit in records review, medical decision making, counseling pertinent to today's visit, and charting.   She is doing well with the increased dose of Wellbutrin .  We discussed the time of day that she should take it, early in the morning is best.  Sleep hygiene briefly discussed.  I recommend adding trazodone.  Benefits, risk, and side effects were discussed and she accepts.  Continue Xanax  0.25 mg, 1 p.o. daily as needed anxiety.  Takes sparingly. Continue Wellbutrin  XL 300 mg, 1 p.o. every morning.   Continue Celexa  40 mg, 1 p.o. daily. Continue hydroxyzine  10 mg, 1-2 3 times daily as needed anxiety or sleep. Continue Vyvanse  40 mg, 1 p.o. every morning. Start trazodone 100 mg, 1/2-2 p.o. nightly as needed sleep. Continue vitamins as per med list. Return in 6 weeks.  Verneita Cooks, PA-C

## 2024-08-21 ENCOUNTER — Other Ambulatory Visit: Payer: Self-pay | Admitting: Internal Medicine

## 2024-09-02 ENCOUNTER — Other Ambulatory Visit: Payer: Self-pay | Admitting: Internal Medicine

## 2024-09-02 DIAGNOSIS — R0609 Other forms of dyspnea: Secondary | ICD-10-CM

## 2024-09-05 ENCOUNTER — Other Ambulatory Visit: Payer: Self-pay

## 2024-09-05 DIAGNOSIS — R0609 Other forms of dyspnea: Secondary | ICD-10-CM

## 2024-09-05 MED ORDER — ALBUTEROL SULFATE HFA 108 (90 BASE) MCG/ACT IN AERS: 2.0000 | INHALATION_SPRAY | Freq: Four times a day (QID) | RESPIRATORY_TRACT | 11 refills | Status: AC | PRN

## 2024-09-05 NOTE — Telephone Encounter (Signed)
 Refilled: 01/14/23 Last OV: 06/09/2023 Next OV: 09/18/2024

## 2024-09-08 ENCOUNTER — Other Ambulatory Visit: Payer: Self-pay | Admitting: Physician Assistant

## 2024-09-18 ENCOUNTER — Ambulatory Visit: Admitting: Internal Medicine

## 2024-09-18 ENCOUNTER — Other Ambulatory Visit: Payer: Self-pay | Admitting: Internal Medicine

## 2024-09-18 ENCOUNTER — Telehealth: Admitting: Internal Medicine

## 2024-09-18 VITALS — Ht 66.0 in | Wt 275.0 lb

## 2024-09-18 DIAGNOSIS — R0609 Other forms of dyspnea: Secondary | ICD-10-CM

## 2024-09-18 DIAGNOSIS — R87612 Low grade squamous intraepithelial lesion on cytologic smear of cervix (LGSIL): Secondary | ICD-10-CM | POA: Diagnosis not present

## 2024-09-18 DIAGNOSIS — F909 Attention-deficit hyperactivity disorder, unspecified type: Secondary | ICD-10-CM

## 2024-09-18 DIAGNOSIS — J4530 Mild persistent asthma, uncomplicated: Secondary | ICD-10-CM

## 2024-09-18 DIAGNOSIS — Z6841 Body Mass Index (BMI) 40.0 and over, adult: Secondary | ICD-10-CM

## 2024-09-18 DIAGNOSIS — R7301 Impaired fasting glucose: Secondary | ICD-10-CM

## 2024-09-18 DIAGNOSIS — I10 Essential (primary) hypertension: Secondary | ICD-10-CM

## 2024-09-18 MED ORDER — ONDANSETRON 4 MG PO TBDP: 4.0000 mg | ORAL_TABLET | Freq: Three times a day (TID) | ORAL | 2 refills | Status: AC | PRN

## 2024-09-18 MED ORDER — AMLODIPINE BESYLATE 5 MG PO TABS: 5.0000 mg | ORAL_TABLET | Freq: Every day | ORAL | 1 refills | Status: AC

## 2024-09-18 NOTE — Assessment & Plan Note (Signed)
I have congratulated her in reduction of   BMI and encouraged  Continued weight loss with goal of 10% of body weight over the next 6 months using a low glycemic index diet and regular exercise a minimum of 5 days per week.     

## 2024-09-18 NOTE — Assessment & Plan Note (Signed)
 Persistent with biopsies negative for CA.  Annual PAP smears are done by GYN Ramiro Rogers, MD in [place] , last one June 2025

## 2024-09-18 NOTE — Progress Notes (Signed)
 Virtual Visit via Caregility   Note   This format is felt to be most appropriate for this patient at this time.  All issues noted in this document were discussed and addressed.  No physical exam was performed (except for noted visual exam findings with Video Visits).   I connected with  [name] on  at  3:00 PM EST by a video enabled telemedicine application   This format is felt to be most appropriate for this patient at this time.  All issues noted in this document were discussed and addressed.  No physical exam was performed (except for noted visual exam findings with Video Visits).  Location patient: home Location provider:  home office Persons participating in the virtual visit: patient, provider  I discussed the limitations, risks, security and privacy concerns of performing an evaluation and management service by telephone and the availability of in person appointments. I also discussed with the patient that there may be a patient responsible charge related to this service. The patient expressed understanding and agreed to proceed.   Reason for visit: follow up on chronic issues   HPI:  Cynthia Lewis is a 39 yr old female with a history of obesity, hypertension, moderate persistent asthma , migraine headaches,  ADD and anxiety  who was last seen in Oct 2024.    1) HTN:  She has not taken amlodipine  in over a month and has not been checking her blood pressure  because she does not own a machine.  She denies headaches (except occasional migraines) , chest pain and shortness of breath.  She has no history of adverse reactions to amlodipine  and would like to resume it.   2) ADD:  she is managing her ADD with Vyvanse  prescribed by Verneita Cooks, who is also prescribing alprazolam . She is tolerating the vyvanse  and notes greatly improved concentration and less background music when she takes it regularly.  3) Obesity:  she has struggled with her weight since she graduated from college and started  working full time at [employer].  lost 16 lbs since her last visit  (Oct 2024) by eating more meals at home , less fast food, and is exercising 3 days per week. Her personal best weight was 177 lbs in 2016   4) Asthma:  she has been using Symbicort  as her maintenance inhaler and uses albuterol  preexercise.   5) Migraine: her headaches are occurring less than 5/month.  She is managing the nausea with zofran  and needs a refill .   ROS: See pertinent positives and negatives per HPI.  Past Medical History:  Diagnosis Date   ADHD    Anemia 2006   Anxiety Summer 2017   Closed left ankle fracture 01/11/2021   comminuted bimalleolar left ankle fracture-dislocation status post reduction, occurred in May while walking dog.      Depression    Hx of dysplastic nevus 2012   Left medial buttock    Hypertension    Hypertension     Past Surgical History:  Procedure Laterality Date   ORIF ANKLE FRACTURE Left 01/12/2021   Procedure: OPEN REDUCTION INTERNAL FIXATION (ORIF) ANKLE FRACTURE;  Surgeon: Marchia Drivers, MD;  Location: [facility] ORS;  Service: Orthopedics;  Laterality: Left;    Family History  Problem Relation Age of Onset   Hypertension Mother    Anxiety disorder Mother    Ovarian cancer Mother    ADD / ADHD Father    Depression Father    Anxiety disorder Father    Diabetes  Father    Hypertension Father    Hyperlipidemia Father    Hearing loss Father    Healthy Brother    Diabetes Maternal Grandfather    Heart failure Maternal Grandfather    Heart failure Maternal Grandmother    Anxiety disorder Maternal Grandmother    Diabetes Paternal Grandfather    Cancer Paternal Grandfather    Cancer Paternal Grandmother 22       breast ca   Diabetes Paternal Grandmother     SOCIAL HX:  reports that she has never smoked. She has never used smokeless tobacco. She reports current alcohol use of about 1.0 standard drink of alcohol per week. She reports that she does not use drugs.   Current  Medications[1]  EXAM:  VITALS per patient if applicable:  General appearance: alert, cooperative and articulate.  No signs of being in distress  Lungs: not short of breath ,  No cough, speaking in full sentences  Psych: affect normal,dspeech is articulate and non pressured .  Denies suicidal thoughts     ASSESSMENT AND PLAN: LGSIL on Pap smear of cervix Assessment & Plan: Persistent with biopsies negative for CA.  Annual PAP smears are done by GYN Ramiro Rogers, MD in [place] , last one June 2025   Dyspnea on exertion Assessment & Plan: attributed to asthma and managed by pre exercise albuterol     Morbid obesity (HCC) Assessment & Plan: I have congratulated her in reduction of   BMI and encouraged  Continued weight loss with goal of 10% of body weight over the next 6 months using a low glycemic index diet and regular exercise a minimum of 5 days per week.     Mild persistent asthma, unspecified whether complicated Assessment & Plan: Managed with Symbicort  used twice daily.  She has not had pulmonology follow up since Dr Bobbi left [place] .  Will make referral    Adult ADHD Assessment & Plan: Managed by psychiatry NP  with Vyvanse  after treatment failure of Straterra    Other orders -     amLODIPine  Besylate; Take 1 tablet (5 mg total) by mouth daily.  Dispense: 90 tablet; Refill: 1 -     Ondansetron ; Take 1 tablet (4 mg total) by mouth every 8 (eight) hours as needed for nausea or vomiting.  Dispense: 20 tablet; Refill: 2      I discussed the assessment and treatment plan with the patient. The patient was provided an opportunity to ask questions and all were answered. The patient agreed with the plan and demonstrated an understanding of the instructions.   The patient was advised to call back or seek an in-person evaluation if the symptoms worsen or if the condition fails to improve as anticipated.   I spent 30  minutes dedicated to the care of this patient on the date  of this telephone encounter to include pre-visit review of his medical history,  non  Face-to-face time with the patient , and post visit ordering of testing and therapeutics.    Verneita LITTIE Kettering, MD         [1]  Current Outpatient Medications:    albuterol  (VENTOLIN  HFA) 108 (90 Base) MCG/ACT inhaler, Inhale 2 puffs into the lungs every 6 (six) hours as needed for wheezing or shortness of breath., Disp: 3.7 g, Rfl: 11   ALPRAZolam  (XANAX ) 0.25 MG tablet, Take 1 tablet (0.25 mg total) by mouth daily as needed for anxiety., Disp: 30 tablet, Rfl: 5   buPROPion  (WELLBUTRIN  XL)  300 MG 24 hr tablet, TAKE 1 TABLET BY MOUTH EVERY  MORNING, Disp: 90 tablet, Rfl: 0   citalopram  (CELEXA ) 40 MG tablet, TAKE 1 TABLET(40 MG) BY MOUTH DAILY, Disp: 90 tablet, Rfl: 0   levonorgestrel  (MIRENA ) 20 MCG/24HR IUD, 1 each by Intrauterine route once., Disp: , Rfl:    lisdexamfetamine  (VYVANSE ) 40 MG capsule, Take 1 capsule (40 mg total) by mouth every morning., Disp: 30 capsule, Rfl: 0   lisdexamfetamine  (VYVANSE ) 40 MG capsule, Take 1 capsule (40 mg total) by mouth every morning., Disp: 30 capsule, Rfl: 0   lisdexamfetamine  (VYVANSE ) 40 MG capsule, Take 1 capsule (40 mg total) by mouth every morning., Disp: 30 capsule, Rfl: 0   Multiple Vitamin (MULTIVITAMIN ADULT PO), Take by mouth., Disp: , Rfl:    SYMBICORT  80-4.5 MCG/ACT inhaler, INHALE 2 PUFFS INTO THE LUNGS TWICE DAILY, Disp: 10.2 g, Rfl: 0   traZODone  (DESYREL ) 100 MG tablet, TAKE 1/2 TO 2 TABLETS(50 TO 200 MG) BY MOUTH AT BEDTIME AS NEEDED FOR SLEEP, Disp: 60 tablet, Rfl: 0   amLODipine  (NORVASC ) 5 MG tablet, Take 1 tablet (5 mg total) by mouth daily., Disp: 90 tablet, Rfl: 1   ondansetron  (ZOFRAN -ODT) 4 MG disintegrating tablet, Take 1 tablet (4 mg total) by mouth every 8 (eight) hours as needed for nausea or vomiting., Disp: 20 tablet, Rfl: 2

## 2024-09-18 NOTE — Assessment & Plan Note (Signed)
 attributed to asthma and managed by pre exercise albuterol 

## 2024-09-18 NOTE — Assessment & Plan Note (Signed)
 Managed with Symbicort  used twice daily.  She has not had pulmonology follow up since Dr Bobbi left [place] .  Will make referral

## 2024-09-18 NOTE — Assessment & Plan Note (Signed)
 Managed by psychiatry NP  with Vyvanse  after treatment failure of Straterra

## 2024-10-06 ENCOUNTER — Telehealth: Admitting: Physician Assistant
